# Patient Record
Sex: Male | Born: 1955
Health system: Southern US, Community
[De-identification: ages and names within clinical notes are randomized; demographics above are authoritative.]

## PROBLEM LIST (undated history)

## (undated) DIAGNOSIS — I639 Cerebral infarction, unspecified: Secondary | ICD-10-CM

## (undated) DIAGNOSIS — E785 Hyperlipidemia, unspecified: Secondary | ICD-10-CM

## (undated) DIAGNOSIS — D151 Benign neoplasm of heart: Secondary | ICD-10-CM

## (undated) DIAGNOSIS — I10 Essential (primary) hypertension: Secondary | ICD-10-CM

## (undated) DIAGNOSIS — Z86018 Personal history of other benign neoplasm: Secondary | ICD-10-CM

## (undated) HISTORY — PX: NO PAST SURGERIES: SHX2092

---

## 2016-08-08 DIAGNOSIS — I10 Essential (primary) hypertension: Secondary | ICD-10-CM | POA: Diagnosis not present

## 2016-08-08 DIAGNOSIS — E785 Hyperlipidemia, unspecified: Secondary | ICD-10-CM | POA: Diagnosis not present

## 2016-08-08 DIAGNOSIS — Z23 Encounter for immunization: Secondary | ICD-10-CM | POA: Diagnosis not present

## 2016-08-08 DIAGNOSIS — Z0000 Encounter for general adult medical examination without abnormal findings: Secondary | ICD-10-CM | POA: Diagnosis not present

## 2017-06-04 DIAGNOSIS — Z0000 Encounter for general adult medical examination without abnormal findings: Secondary | ICD-10-CM | POA: Diagnosis not present

## 2017-06-04 DIAGNOSIS — Z23 Encounter for immunization: Secondary | ICD-10-CM | POA: Diagnosis not present

## 2017-06-04 DIAGNOSIS — E669 Obesity, unspecified: Secondary | ICD-10-CM | POA: Diagnosis not present

## 2017-06-04 DIAGNOSIS — I10 Essential (primary) hypertension: Secondary | ICD-10-CM | POA: Diagnosis not present

## 2017-06-04 DIAGNOSIS — Z1211 Encounter for screening for malignant neoplasm of colon: Secondary | ICD-10-CM | POA: Diagnosis not present

## 2018-04-08 DIAGNOSIS — I10 Essential (primary) hypertension: Secondary | ICD-10-CM | POA: Diagnosis not present

## 2018-04-08 DIAGNOSIS — I63512 Cerebral infarction due to unspecified occlusion or stenosis of left middle cerebral artery: Secondary | ICD-10-CM | POA: Diagnosis not present

## 2018-04-08 DIAGNOSIS — I639 Cerebral infarction, unspecified: Secondary | ICD-10-CM | POA: Diagnosis not present

## 2018-04-08 DIAGNOSIS — R29810 Facial weakness: Secondary | ICD-10-CM | POA: Diagnosis not present

## 2018-04-08 DIAGNOSIS — R29818 Other symptoms and signs involving the nervous system: Secondary | ICD-10-CM | POA: Diagnosis not present

## 2018-04-08 DIAGNOSIS — E785 Hyperlipidemia, unspecified: Secondary | ICD-10-CM | POA: Diagnosis not present

## 2018-04-08 DIAGNOSIS — M1990 Unspecified osteoarthritis, unspecified site: Secondary | ICD-10-CM | POA: Diagnosis not present

## 2018-04-08 DIAGNOSIS — I6523 Occlusion and stenosis of bilateral carotid arteries: Secondary | ICD-10-CM | POA: Diagnosis not present

## 2018-04-08 DIAGNOSIS — R4701 Aphasia: Secondary | ICD-10-CM | POA: Diagnosis not present

## 2018-04-08 DIAGNOSIS — I63419 Cerebral infarction due to embolism of unspecified middle cerebral artery: Secondary | ICD-10-CM | POA: Diagnosis not present

## 2018-04-08 DIAGNOSIS — R29702 NIHSS score 2: Secondary | ICD-10-CM | POA: Diagnosis not present

## 2018-04-08 DIAGNOSIS — D151 Benign neoplasm of heart: Secondary | ICD-10-CM | POA: Diagnosis not present

## 2018-04-08 HISTORY — DX: Cerebral infarction, unspecified: I63.9

## 2018-04-08 HISTORY — DX: Benign neoplasm of heart: D15.1

## 2018-04-09 ENCOUNTER — Other Ambulatory Visit: Payer: Self-pay

## 2018-04-09 ENCOUNTER — Encounter (HOSPITAL_COMMUNITY): Payer: Self-pay | Admitting: General Practice

## 2018-04-09 ENCOUNTER — Inpatient Hospital Stay (HOSPITAL_COMMUNITY)
Admission: AD | Admit: 2018-04-09 | Discharge: 2018-04-23 | DRG: 982 | Disposition: A | Discharge status: Home Health | Payer: BLUE CROSS/BLUE SHIELD | Source: Other Acute Inpatient Hospital | Attending: Thoracic Surgery (Cardiothoracic Vascular Surgery) | Admitting: Thoracic Surgery (Cardiothoracic Vascular Surgery)

## 2018-04-09 DIAGNOSIS — I4891 Unspecified atrial fibrillation: Secondary | ICD-10-CM | POA: Diagnosis not present

## 2018-04-09 DIAGNOSIS — K760 Fatty (change of) liver, not elsewhere classified: Secondary | ICD-10-CM | POA: Diagnosis present

## 2018-04-09 DIAGNOSIS — I639 Cerebral infarction, unspecified: Secondary | ICD-10-CM | POA: Diagnosis present

## 2018-04-09 DIAGNOSIS — Z86018 Personal history of other benign neoplasm: Secondary | ICD-10-CM | POA: Diagnosis not present

## 2018-04-09 DIAGNOSIS — Z01810 Encounter for preprocedural cardiovascular examination: Secondary | ICD-10-CM | POA: Diagnosis not present

## 2018-04-09 DIAGNOSIS — E785 Hyperlipidemia, unspecified: Secondary | ICD-10-CM | POA: Diagnosis present

## 2018-04-09 DIAGNOSIS — I484 Atypical atrial flutter: Secondary | ICD-10-CM | POA: Diagnosis not present

## 2018-04-09 DIAGNOSIS — D151 Benign neoplasm of heart: Secondary | ICD-10-CM | POA: Diagnosis not present

## 2018-04-09 DIAGNOSIS — N200 Calculus of kidney: Secondary | ICD-10-CM | POA: Diagnosis not present

## 2018-04-09 DIAGNOSIS — I4892 Unspecified atrial flutter: Secondary | ICD-10-CM | POA: Diagnosis not present

## 2018-04-09 DIAGNOSIS — Z823 Family history of stroke: Secondary | ICD-10-CM

## 2018-04-09 DIAGNOSIS — R29703 NIHSS score 3: Secondary | ICD-10-CM | POA: Diagnosis present

## 2018-04-09 DIAGNOSIS — I2510 Atherosclerotic heart disease of native coronary artery without angina pectoris: Secondary | ICD-10-CM | POA: Diagnosis not present

## 2018-04-09 DIAGNOSIS — Z801 Family history of malignant neoplasm of trachea, bronchus and lung: Secondary | ICD-10-CM

## 2018-04-09 DIAGNOSIS — R29810 Facial weakness: Secondary | ICD-10-CM | POA: Diagnosis present

## 2018-04-09 DIAGNOSIS — Z8249 Family history of ischemic heart disease and other diseases of the circulatory system: Secondary | ICD-10-CM

## 2018-04-09 DIAGNOSIS — I959 Hypotension, unspecified: Secondary | ICD-10-CM | POA: Diagnosis not present

## 2018-04-09 DIAGNOSIS — D219 Benign neoplasm of connective and other soft tissue, unspecified: Secondary | ICD-10-CM | POA: Diagnosis not present

## 2018-04-09 DIAGNOSIS — I69320 Aphasia following cerebral infarction: Secondary | ICD-10-CM | POA: Diagnosis not present

## 2018-04-09 DIAGNOSIS — J9811 Atelectasis: Secondary | ICD-10-CM

## 2018-04-09 DIAGNOSIS — M109 Gout, unspecified: Secondary | ICD-10-CM | POA: Diagnosis not present

## 2018-04-09 DIAGNOSIS — I69359 Hemiplegia and hemiparesis following cerebral infarction affecting unspecified side: Secondary | ICD-10-CM | POA: Diagnosis not present

## 2018-04-09 DIAGNOSIS — F1010 Alcohol abuse, uncomplicated: Secondary | ICD-10-CM | POA: Diagnosis present

## 2018-04-09 DIAGNOSIS — I10 Essential (primary) hypertension: Secondary | ICD-10-CM | POA: Diagnosis present

## 2018-04-09 DIAGNOSIS — K5900 Constipation, unspecified: Secondary | ICD-10-CM | POA: Diagnosis not present

## 2018-04-09 DIAGNOSIS — E119 Type 2 diabetes mellitus without complications: Secondary | ICD-10-CM | POA: Diagnosis present

## 2018-04-09 DIAGNOSIS — I340 Nonrheumatic mitral (valve) insufficiency: Secondary | ICD-10-CM | POA: Diagnosis not present

## 2018-04-09 DIAGNOSIS — I69398 Other sequelae of cerebral infarction: Secondary | ICD-10-CM | POA: Diagnosis not present

## 2018-04-09 DIAGNOSIS — R471 Dysarthria and anarthria: Secondary | ICD-10-CM | POA: Diagnosis present

## 2018-04-09 DIAGNOSIS — I63512 Cerebral infarction due to unspecified occlusion or stenosis of left middle cerebral artery: Secondary | ICD-10-CM | POA: Diagnosis not present

## 2018-04-09 DIAGNOSIS — R269 Unspecified abnormalities of gait and mobility: Secondary | ICD-10-CM | POA: Diagnosis not present

## 2018-04-09 DIAGNOSIS — D62 Acute posthemorrhagic anemia: Secondary | ICD-10-CM | POA: Diagnosis not present

## 2018-04-09 DIAGNOSIS — J90 Pleural effusion, not elsewhere classified: Secondary | ICD-10-CM

## 2018-04-09 DIAGNOSIS — R079 Chest pain, unspecified: Secondary | ICD-10-CM

## 2018-04-09 DIAGNOSIS — E876 Hypokalemia: Secondary | ICD-10-CM | POA: Diagnosis not present

## 2018-04-09 DIAGNOSIS — R000 Tachycardia, unspecified: Secondary | ICD-10-CM | POA: Diagnosis not present

## 2018-04-09 DIAGNOSIS — J939 Pneumothorax, unspecified: Secondary | ICD-10-CM

## 2018-04-09 DIAGNOSIS — R4701 Aphasia: Secondary | ICD-10-CM | POA: Diagnosis present

## 2018-04-09 DIAGNOSIS — E8770 Fluid overload, unspecified: Secondary | ICD-10-CM | POA: Diagnosis not present

## 2018-04-09 DIAGNOSIS — R918 Other nonspecific abnormal finding of lung field: Secondary | ICD-10-CM | POA: Diagnosis not present

## 2018-04-09 DIAGNOSIS — R4781 Slurred speech: Secondary | ICD-10-CM | POA: Diagnosis not present

## 2018-04-09 DIAGNOSIS — I63412 Cerebral infarction due to embolism of left middle cerebral artery: Principal | ICD-10-CM | POA: Diagnosis present

## 2018-04-09 DIAGNOSIS — Z4682 Encounter for fitting and adjustment of non-vascular catheter: Secondary | ICD-10-CM | POA: Diagnosis not present

## 2018-04-09 DIAGNOSIS — I63419 Cerebral infarction due to embolism of unspecified middle cerebral artery: Secondary | ICD-10-CM | POA: Diagnosis not present

## 2018-04-09 HISTORY — DX: Essential (primary) hypertension: I10

## 2018-04-09 HISTORY — DX: Personal history of other benign neoplasm: Z86.018

## 2018-04-09 HISTORY — DX: Benign neoplasm of heart: D15.1

## 2018-04-09 HISTORY — DX: Cerebral infarction, unspecified: I63.9

## 2018-04-09 HISTORY — DX: Hyperlipidemia, unspecified: E78.5

## 2018-04-09 LAB — HEPARIN LEVEL (UNFRACTIONATED): Heparin Unfractionated: 0.26 IU/mL — ABNORMAL LOW (ref 0.30–0.70)

## 2018-04-09 LAB — RAPID URINE DRUG SCREEN, HOSP PERFORMED
Amphetamines: NOT DETECTED
Barbiturates: NOT DETECTED
Benzodiazepines: NOT DETECTED
Cocaine: NOT DETECTED
Opiates: NOT DETECTED
Tetrahydrocannabinol: NOT DETECTED

## 2018-04-09 LAB — TSH: TSH: 1.586 u[IU]/mL (ref 0.350–4.500)

## 2018-04-09 MED ORDER — SENNOSIDES-DOCUSATE SODIUM 8.6-50 MG PO TABS: 1.0000 | ORAL_TABLET | Freq: Every evening | ORAL | Status: DC | PRN

## 2018-04-09 MED ORDER — STROKE: EARLY STAGES OF RECOVERY BOOK
Freq: Once | Status: AC
  Administered 2018-04-09: 18:00:00

## 2018-04-09 MED ORDER — ACETAMINOPHEN 325 MG PO TABS: 650.0000 mg | ORAL_TABLET | ORAL | Status: DC | PRN

## 2018-04-09 MED ORDER — ACETAMINOPHEN 650 MG RE SUPP: 650.0000 mg | RECTAL | Status: DC | PRN

## 2018-04-09 MED ORDER — HEPARIN (PORCINE) 25000 UT/250ML-% IV SOLN
1200.0000 [IU]/h | INTRAVENOUS | Status: DC
  Administered 2018-04-09: 1050 [IU]/h via INTRAVENOUS
  Administered 2018-04-10: 1200 [IU]/h via INTRAVENOUS
  Filled 2018-04-09 (×2): qty 250

## 2018-04-09 MED ORDER — ATORVASTATIN CALCIUM 80 MG PO TABS
80.0000 mg | ORAL_TABLET | Freq: Every day | ORAL | Status: DC
  Administered 2018-04-09: 80 mg via ORAL
  Filled 2018-04-09: qty 1

## 2018-04-09 MED ORDER — ACETAMINOPHEN 160 MG/5ML PO SOLN: 650.0000 mg | ORAL | Status: DC | PRN

## 2018-04-09 MED ORDER — ATORVASTATIN CALCIUM 80 MG PO TABS: 80.0000 mg | ORAL_TABLET | Freq: Every day | ORAL | Status: DC

## 2018-04-09 MED ORDER — SODIUM CHLORIDE 0.9 % IV SOLN
INTRAVENOUS | Status: DC
  Administered 2018-04-09: 18:00:00 via INTRAVENOUS
  Administered 2018-04-10: 1000 mL via INTRAVENOUS
  Administered 2018-04-12 – 2018-04-14 (×3): via INTRAVENOUS

## 2018-04-09 MED ORDER — ATORVASTATIN CALCIUM 40 MG PO TABS: 40.0000 mg | ORAL_TABLET | Freq: Every day | ORAL | Status: DC

## 2018-04-09 NOTE — Consult Note (Addendum)
Neurology Consultation  Reason for Consult: Stroke and clearance for surgery as patient has a myxoma Referring Physician: Lorin Mercy  CC: Expressive aphasia  History is obtained from: wife and chart  HPI: Shawn Mccann is a 63 y.o. male hypertension, hyperlipidemia, atrial myxoma. Patient presented to urgent care after noting at work he was having difficulty speaking and expressing what he wanted to say.He went to urgent care initially then was sent to [facility]. CT head and MRI showed stroke in multiple distributions along with Echo showing a Myxoma in the left atrium. Patient was sent to [facility] [place].  Currently patient continues to have expressive and receptive aphasia.  He has difficulty naming objects, express himself, receiving information.  Neurology was asked to see patient for recommendations about stroke in the setting of atrial myxoma.  ED course: at [facility].--CTA head and neck /CT head/ MRI head. Labs and Echo  LKW: 04/09/2018 tpa given?: no, out of window Premorbid modified Rankin scale (mRS): 0 NIHSS 3   ROS: A 14 point ROS was performed and is negative except as noted in the HPI.  Past Medical History:  Diagnosis Date  . Atrial myxoma 04/08/2018  . CVA (cerebral vascular accident) ([place]) 04/08/2018  . Dyslipidemia   . Hypertension     Family History  Problem Relation Age of Onset  . Lung cancer Mother 50  . CAD Father 35  . Hypertension Sister   . Hypertension Brother   . CVA Maternal Grandfather   . CVA Other     Social History:   reports that he has never smoked. He has never used smokeless tobacco. He reports current alcohol use. He reports that he does not use drugs.  Medications No current facility-administered medications for this encounter.    Exam: Current vital signs: BP (!) 151/83 (BP Location: Left Arm)   Pulse 95   Temp 98.3 F (36.8 C) (Oral)   Resp 18   SpO2 98%  Vital signs in last 24 hours: Temp:  [98.3 F (36.8 C)]  98.3 F (36.8 C) (01/29 1249) Pulse Rate:  [95] 95 (01/29 1249) Resp:  [18] 18 (01/29 1249) BP: (151)/(83) 151/83 (01/29 1249) SpO2:  [98 %] 98 % (01/29 1249)  Physical Exam  Constitutional: Appears well-developed and well-nourished.  Psych: Affect appropriate to situation Eyes: No scleral injection HENT: No OP obstrucion Head: Normocephalic.  Cardiovascular: Normal rate and regular rhythm.  Respiratory: Effort normal, non-labored breathing GI: Soft.  No distension. There is no tenderness.  Skin: WDI  Neuro: Mental Status: Patient is awake, alert, oriented to person, place, month, year, and situation. Patient is able to give a clear and coherent history. Shows expressive, receptive aphasia with difficulty naming and following commands Cranial Nerves: II: Visual Fields are full.  III,IV, VI: EOMI without ptosis or diploplia. Pupils are equal, round, and reactive to light.  He did show saccadic eye movements V: Facial sensation is symmetric to temperature VII: Facial movement is symmetric.  VIII: hearing is intact to voice X: Uvula elevates symmetrically XI: Shoulder shrug is symmetric. XII: tongue is midline without atrophy or fasciculations.  Motor: Tone is normal. Bulk is normal. 5/5 strength was present on left upper and lower extremities.  Right upper extremity is 4/5 and right lower extremity was 5/5 Sensory: Sensation is symmetric to light touch and temperature in the arms and legs. Deep Tendon Reflexes: 2+ and symmetric in the biceps and patellae.  Plantars: Toes are downgoing bilaterally.  Cerebellar: FNF and HKS are  intact bilaterally     Labs I have reviewed labs in epic and the results pertinent to this consultation are:   CBC No results found for: WBC, RBC, HGB, HCT, PLT, MCV, MCH, MCHC, RDW, LYMPHSABS, MONOABS, EOSABS, BASOSABS  CMP  No results found for: NA, K, CL, CO2, GLUCOSE, BUN, CREATININE, CALCIUM, PROT, ALBUMIN, AST, ALT, ALKPHOS, BILITOT,  GFRNONAA, GFRAA  Lipid Panel  No results found for: CHOL, TRIG, HDL, CHOLHDL, VLDL, LDLCALC, LDLDIRECT   Imaging I have reviewed the images obtained:  CT-scan of the brain--Evolving hypodensity involving the supra ganglionic left frontal cortex, compatible with evolving acute left MCA territory infarct.  CTA head and neck--shows no large vessel occlusion.  MRI examination of the brain--BILATERAL areas of restricted diffusion in disparate vascular territories most consistent with multiple acute infarcts secondary to shower of emboli. The dominant abnormality remains LEFT frontal opercular cortex, without visible hemorrhage  Echo: 60-65% EF with 2.2 X 1.3 cm Echo dense mass likely Myxoma  Etta Quill PA-C [facility] [phone]  M-F  (9:00 am- 5:00 PM)  04/09/2018, 2:55 PM   Attending addendum Patient seen and examined Patient is a 63 year old man who presented to [facility] with what finding difficulty and mild right-sided weakness. MRI showed strokes predominantly in the left cerebral hemisphere but also in the right anterior circulation and posterior circulation on the left.  Further work-up of the stroke revealed an atrial myxoma and he was sent to [facility] for cardiothoracic surgery evaluation. On examination, his NIH stroke scale is 3.   Assessment:  63 year old male presenting to hospital after having bilateral infarcts involving multiple vascular distributions in the setting of left atrial myxoma.  Impression: - Cardioembolic stroke - Left atrial myxoma  Recommendations: -stroke protocolNo bolus I V heparins  level 0.3-.05 -Cardiothoracic surgery consultation for myxoma -Majority of the stroke work-up is been completed -Obtain lipid panel and A1c  Stroke team will follow with you.

## 2018-04-09 NOTE — Consult Note (Addendum)
Admit date: 04/09/2018 Referring Physician  Dr. Lorin Mercy Primary Physician  Wende Neighbors, MD Primary Cardiologist  none Reason for Consultation  LA myxoma with CVA  HPI: Shawn Mccann is a 63 y.o. male who is being seen today for the evaluation of LA myxoma at the request of Dr. Karmen Bongo.    This is a 63yo male with a history of HTN who was in his USOH until yesterday when he was at work and started feeling funny and was unable to speak correctly.  He went home and then drove himselt to his sisters house and she noticed he was having difficulty speaking and expressing his words c/w expressive and receptive aphasia. He also had mild right sided weakness.  He was taken to Urgent Care by his family and then to [facility] ER where a head CT showed an acute CVA.  MRI of the brain revealed strokes in multiple distributions but predominantly left cerebral hemisphere but also in the right anterior and posterior circulation on the left.    A 2D echo was done showing a 2.2 x 1.3cm pedunculated mass in LA attached to the IAS by a stalk in the area of the fossa ovalis c/w left atrial myxoma.  The echo also showed  normal LVF with ER 60-65%  With G1DD, mild LAE.  Labs showed WBC 11.3, Hbg 16.2, Na 139, K+ 4, Creatinine 1.2, Trop < 0.01, INR 1.  LDL was 162, HDL 39 Head and neck CTA showed normal aortic arch and subclavian arteries, mild plaque at the right carotid bifurcation with no stenosis and widely patent right ICA, Left carotid system patent, patent vertebral arteries.  He was started on ASA and statin.  No Heparin initiated due to risk of hemorrhagic conversion.  EKG showed NSR at 96 bpm with no ST changes. He is now transferred to [facility] for further evaluation by CVTS surgery.  He has not had any problems up until yesterday.  He denies any chest pain or pressure, SOB, DOE, LE edema, palpitations.  He does not smoke but does have a family hx of CAD and HTN.    PMH:   Past Medical History:  Diagnosis Date  .  Atrial myxoma 04/08/2018  . CVA (cerebral vascular accident) ([place]) 04/08/2018  . Dyslipidemia   . Hypertension      PSH:   Past Surgical History:  Procedure Laterality Date  . NO PAST SURGERIES      Allergies:  Patient has no known allergies. Prior to Admit Meds:   Medications Prior to Admission  Medication Sig Dispense Refill Last Dose  . acetaminophen (TYLENOL) 325 MG tablet Take 650 mg by mouth every 6 (six) hours as needed for mild pain or headache.   Past Month at Unknown time  . metoprolol (TOPROL-XL) 200 MG 24 hr tablet Take 200 mg by mouth daily.   04/08/2018 at 0600  . pravastatin (PRAVACHOL) 80 MG tablet Take 80 mg by mouth daily.   04/08/2018 at Unknown time   Fam HX:    Family History  Problem Relation Age of Onset  . Lung cancer Mother 16  . CAD Father 45  . Hypertension Sister   . Hypertension Brother   . CVA Maternal Grandfather   . CVA Other    Social HX:    Social History   Socioeconomic History  . Marital status: Single    Spouse name: Not on file  . Number of children: Not on file  . Years of education:  Not on file  . Highest education level: Not on file  Occupational History  . Occupation: Games developer  Social Needs  . Financial resource strain: Not on file  . Food insecurity:    Worry: Not on file    Inability: Not on file  . Transportation needs:    Medical: Not on file    Non-medical: Not on file  Tobacco Use  . Smoking status: Never Smoker  . Smokeless tobacco: Never Used  Substance and Sexual Activity  . Alcohol use: Yes    Comment: occasional  . Drug use: Never  . Sexual activity: Not on file  Lifestyle  . Physical activity:    Days per week: Not on file    Minutes per session: Not on file  . Stress: Not on file  Relationships  . Social connections:    Talks on phone: Not on file    Gets together: Not on file    Attends religious service: Not on file    Active member of club or organization: Not on file    Attends meetings  of clubs or organizations: Not on file    Relationship status: Not on file  . Intimate partner violence:    Fear of current or ex partner: Not on file    Emotionally abused: Not on file    Physically abused: Not on file    Forced sexual activity: Not on file  Other Topics Concern  . Not on file  Social History Narrative  . Not on file     ROS:  All  ROS were addressed and are negative except what is stated in the HPI  Physical Exam: Blood pressure (!) 142/88, pulse 91, temperature 97.8 F (36.6 C), temperature source Oral, resp. rate 18, weight 85.7 kg, SpO2 96 %.    General: Well developed, well nourished, in no acute distress Head: Eyes PERRLA, No xanthomas.   Normal cephalic and atramatic  Lungs:   Clear bilaterally to auscultation and percussion. Heart:   HRRR S1 S2 Pulses are 2+ & equal.            No carotid bruit. No JVD.  No abdominal bruits. No femoral bruits. Abdomen: Bowel sounds are positive, abdomen soft and non-tender without masses or                  Hernia's noted. Msk:  Back normal, normal gait. Normal strength and tone for age. Extremities:   No clubbing, cyanosis or edema.  DP +1 Neuro: Alert and oriented X 3. Psych:  Good affect, responds appropriately    Labs:  No results found for: WBC, HGB, HCT, MCV, PLT No results for input(s): NA, K, CL, CO2, BUN, CREATININE, CALCIUM, PROT, BILITOT, ALKPHOS, ALT, AST, GLUCOSE in the last 168 hours.  Invalid input(s): LABALBU No results found for: PTT No results found for: INR, PROTIME No results found for: CKTOTAL, CKMB, CKMBINDEX, TROPONINI  No results found for: CHOL No results found for: HDL No results found for: LDLCALC No results found for: TRIG No results found for: CHOLHDL No results found for: LDLDIRECT    Radiology:  No results found.   Telemetry    NSR - Personally Reviewed  ECG     NSR with no ST changes- Personally Reviewed   ASSESSMENT/PLAN:   1.  Left atrial myxoma -2D echo showed  2.2 x 1.3cm pedunculated LA mass attached by a stalk to the IAS at the fossa ovalis c/w myxoma -Likely etiology of  acute CVAs in multiple vascular territories -Will make NPO after MN for TEE in am -Will need ischemic workup prior to surgical resection.  Given recent CVAs will discuss with CVTS if they are ok with coronary CTA to determine if underlying CAD is present prior to surgery instead of cath. -continue ASA - After careful review of history and examination, the risks and benefits of transesophageal echocardiogram have been explained including risks of esophageal damage, perforation (1:10,000 risk), bleeding, pharyngeal hematoma as well as other potential complications associated with conscious sedation including aspiration, arrhythmia, respiratory failure and death. Alternatives to treatment were discussed, questions were answered. Patient is willing to proceed.   2.  Acute CVAs in multiple vascular territories c/w embolic phenomenon -likely secondary to atrial myxoma -Discharge summary stated "no Heparin due to risk of hemorrhagic conversion" per neuro but now on IV Heparin gtt - defer to IM and Neuro -Head and neck CTA with no obstructive findings -Neuro to follow   3.  HTN -BP elevated initially and Neuro wants permissive HTN for first 24 hours - BP currentyly 142/43mmHg. -was on Lotrel prior to admission -re initiation of BP meds per Neuro and IM - likely tomorrow  4.  Hyperlipidemia -LDL was 162 on admission -start on statin  I have spent a total of 45 minutes with patient reviewing outside hospital notes, 2D echo, Head and neck CTA, Head MRI , telemetry, EKGs, labs and examining patient as well as establishing an assessment and plan that was discussed with the patient.  > 50% of time was spent in direct patient care.    Fransico Him, MD  04/09/2018  5:28 PM

## 2018-04-09 NOTE — H&P (Signed)
History and Physical    Shawn Mccann MWN:[id number] DOB: 1955/05/22 DOA: 04/09/2018  PCP: Ocie Doyne., MD Consultants:  None Patient coming from:  Home - lives alone; NOK: [place], [phone]  Chief Complaint: RH transfer for CVA from possible myxoma  HPI: Shawn Mccann is a 63 y.o. male with medical history significant of HTN presenting with dysarthria; he was found to have multiple infracts on evaluation at [facility] and was transferred to [facility].  Yesterday, he came home from work after noticing feeling funny; it started about 330.  He got home and ws there for a couple of hours.  He drove to his sister's house and he was unable to speak at all.  His BIL took him to an Urgent Care and they sent him to the [facility] ER.At the ER, they did a CT which showed CVA.  They did MRI and Echo and saw what was concerning for a heart issue.  He had no extremity symptoms.  His only obvious issue is expressive aphasia.  It seems to be a bit better.  Family believes he looks normal.   [facility] Course:  Per Dr. Lupita Leash: 63 year old male with history of hypertension presented with dysarthria/ expressive aphasia on 1/28 at [facility]. workup showed multiple acute infarcts, echo with 2.2 x 1.3 mm left atrial mass suspecting atrial myxoma. CT surgeon Dr Roxy Manns contacted, will likely need surgery. Please consult CT surgery on arrival to [facility]. will also need to consult Cardiology and Neurology per CT surgery.    Review of Systems: As per HPI; otherwise review of systems reviewed and negative.   Ambulatory Status:  Ambulates without assistance  Past Medical History:  Diagnosis Date  . Atrial myxoma 04/08/2018  . CVA (cerebral vascular accident) ([place]) 04/08/2018  . Dyslipidemia   . Hypertension     Past Surgical History:  Procedure Laterality Date  . NO PAST SURGERIES      Social History   Socioeconomic History  . Marital status: Single    Spouse name: Not on file  . Number of children: Not on file  . Years of  education: Not on file  . Highest education level: Not on file  Occupational History  . Occupation: Games developer  Social Needs  . Financial resource strain: Not on file  . Food insecurity:    Worry: Not on file    Inability: Not on file  . Transportation needs:    Medical: Not on file    Non-medical: Not on file  Tobacco Use  . Smoking status: Never Smoker  . Smokeless tobacco: Never Used  Substance and Sexual Activity  . Alcohol use: Yes    Comment: occasional  . Drug use: Never  . Sexual activity: Not on file  Lifestyle  . Physical activity:    Days per week: Not on file    Minutes per session: Not on file  . Stress: Not on file  Relationships  . Social connections:    Talks on phone: Not on file    Gets together: Not on file    Attends religious service: Not on file    Active member of club or organization: Not on file    Attends meetings of clubs or organizations: Not on file    Relationship status: Not on file  . Intimate partner violence:    Fear of current or ex partner: Not on file    Emotionally abused: Not on file    Physically abused: Not on file  Forced sexual activity: Not on file  Other Topics Concern  . Not on file  Social History Narrative  . Not on file    No Known Allergies  Family History  Problem Relation Age of Onset  . Lung cancer Mother 52  . CAD Father 21  . Hypertension Sister   . Hypertension Brother   . CVA Maternal Grandfather   . CVA Other     Prior to Admission medications   Medication Sig Start Date End Date Taking? Authorizing Provider  acetaminophen (TYLENOL) 325 MG tablet Take 650 mg by mouth every 6 (six) hours as needed for mild pain or headache.   Yes [provider]  metoprolol (TOPROL-XL) 200 MG 24 hr tablet Take 200 mg by mouth daily. 02/28/18  Yes [provider]  pravastatin (PRAVACHOL) 80 MG tablet Take 80 mg by mouth daily. 02/28/18  Yes [provider]    Physical Exam: Vitals:    04/09/18 1249 04/09/18 1449 04/09/18 1547 04/09/18 1618  BP: (!) 151/83 (!) 144/87  (!) 142/88  Pulse: 95 90  91  Resp: 18   18  Temp: 98.3 F (36.8 C)   97.8 F (36.6 C)  TempSrc: Oral   Oral  SpO2: 98% 99%  96%  Weight:   85.7 kg      . General:  Appears calm and comfortable and is NAD; he has a marked expressive aphasia . Eyes:  PERRL, EOMI, left ptosis (chronic?) . ENT:  grossly normal hearing, lips & tongue, mmm . Neck:  no LAD, masses or thyromegaly; no carotid bruits . Cardiovascular:  RRR, no m/r/g. No LE edema.  Marland Kitchen Respiratory:   CTA bilaterally with no wheezes/rales/rhonchi.  Normal respiratory effort. . Abdomen:  soft, NT, ND, NABS . Skin:  no rash or induration seen on limited exam . Musculoskeletal:  grossly normal tone BUE/BLE, good ROM, no bony abnormality . Psychiatric:  grossly normal mood and affect, pronounced expressive aphasia but he is able to communicate some, AOx3 . Neurologic: CN 2-12 grossly intact other than possible left facial droop (family denies and says this is chronic and hereditary), moves all extremities in coordinated fashion, sensation intact    Radiological Exams on Admission:  CT head "Evolving hypodensity involving the supra ganglionic left frontal cortex, compatible with evolving acute left MCA territory infarct.No intracranial hemorrhage"   MRI Brain "multiple acute infarcts secondary to shower of emboli"   TTE  "1. The left ventricular size is normal. Left ventricular wall thickness is normal. Overall left ventricular systolic function is normal with, an EF between 60 - 65 %. 2. The diastolic filling pattern indicates impaired relaxation. 3. Left atrium is mildly dilated by volume. There is an echodensity in the left atrium attached to the fossa ovalis/atrial septum measuring 2.2X1.3 mm. The mass characteristics are suggestive of a myxoma   CTA neck  "Negative CTA for large vessel occlusion. No hemodynamically significant or  correctable stenosis identified"   Assessment/Plan Principal Problem:   CVA (cerebral vascular accident) ([place]) Active Problems:   Atrial myxoma   Hypertension   Dyslipidemia   CVA -Patient with acute left MCA CVA on CT -MRI with multiple acute infarcts secondary to shower of emboli -Source of emboli is almost certainly atrial myxoma, if present (see below) -Will admit for further CVA evaluation -SDU placement due to concern for myxoma -Telemetry monitoring -Needs TEE and cardiac cath pre-operatively -Risk stratification with FLP, A1c; will also check TSH and UDS -No ASA due to  impending CT surgery - will bridge with Heparin for now as per Neuro -Neurology consult -PT/OT/ST/Nutrition Consults -SW consult for placement pending on progress  Atrial myxoma -CVTS to consult; assuming myxoma is present he will need resection in the coming days -Cardiology has seen the patient -Plan for TEE in AM, NPO after midnight -He will also need ischemic evaluation pre-operatively either via coronary CTA or cath -As per neuro, he cannot have ASA and is appropriate for [facility] with heparin  HTN -Allow permissive HTN for now -Treat BP only if >220/120, and then with goal of 15% reduction -Hold Toprol XL and plan to restart in 48-72 hours   HLD -Check FLP -Resume statin but will change Pravachol to Lipitor 80 mg daily -Despite this not being a CVA apparently associated with CVD RF, it is still important to control his RF empirically to prevent future CVAs      DVT prophylaxis:  Heparin  Code Status: Full - confirmed with patient/family Family Communication: Sisters present throughout evaluation Disposition Plan:  To be determined Consults called: CT surgery; Cardiology; Neurology; PT/OT/ST/Nutrition  Admission status: Admit - It is my clinical opinion that admission to INPATIENT is reasonable and necessary because of the expectation that this patient will require hospital care that crosses at  least 2 midnights to treat this condition based on the medical complexity of the problems presented.  Given the aforementioned information, the predictability of an adverse outcome is felt to be significant.    Karmen Bongo MD [facility]   How to contact the [facility] Attending or Consulting provider [place] or covering provider during after hours [place], for this patient?  1. Check the care team in [facility] and look for a) attending/consulting [facility] provider listed and b) the [facility] team listed 2. Log into [email or web address] and use [place]'s universal password to access. If you do not have the password, please contact the hospital operator. 3. Locate the [facility] provider you are looking for under [facility] and page to a number that you can be directly reached. 4. If you still have difficulty reaching the provider, please page the [facility] (Director on Call) for the Hospitalists listed on amion for assistance.   04/09/2018, 5:54 PM

## 2018-04-09 NOTE — Progress Notes (Signed)
ANTICOAGULATION CONSULT NOTE - Initial Consult  Pharmacy Consult for heparin Indication: CVA with atrial myxoma  No Known Allergies  Patient Measurements: Weight: 188 lb 15 oz (85.7 kg) Heparin Dosing Weight: 85.7 kg  Vital Signs: Temp: 98.3 F (36.8 C) (01/29 1249) Temp Source: Oral (01/29 1249) BP: 144/87 (01/29 1449) Pulse Rate: 90 (01/29 1449)  Labs: No results for input(s): HGB, HCT, PLT, APTT, LABPROT, INR, HEPARINUNFRC, HEPRLOWMOCWT, CREATININE, CKTOTAL, CKMB, TROPONINI in the last 72 hours.  CrCl cannot be calculated (No successful lab value found.).   Medical History: Past Medical History:  Diagnosis Date  . Atrial myxoma 04/08/2018  . CVA (cerebral vascular accident) ([place]) 04/08/2018  . Dyslipidemia   . Hypertension    Assessment: 63 yo male with expressive aphasia. Patient found to have CVA with atrial myxoma. Pharmacy consulted to dose heparin. No anticoagulation PTA noted. No bleeding currently.   Goal of Therapy:  Heparin level 0.3-0.5 Monitor platelets by anticoagulation protocol: Yes   Plan:  Heparin drip at 1050 units/hr, no bolus Heparin level in 6 hours Heparin level and CBC daily Monitor for bleeding.   Breella Vanostrand A. Levada Dy, PharmD, [place] Pager: [phone] Please utilize Amion for appropriate phone number to reach the unit pharmacist ([place])   04/09/2018,3:51 PM

## 2018-04-09 NOTE — Progress Notes (Signed)
ANTICOAGULATION CONSULT NOTE   Pharmacy Consult for Heparin Indication: CVA with atrial myxoma  No Known Allergies  Patient Measurements: Weight: 188 lb 15 oz (85.7 kg) Heparin Dosing Weight: 85.7 kg  Vital Signs: Temp: 98.1 F (36.7 C) (01/29 2113) Temp Source: Oral (01/29 2113) BP: 144/102 (01/29 2113) Pulse Rate: 72 (01/29 2113)  Labs: Recent Labs    04/09/18 2215  HEPARINUNFRC 0.26*    CrCl cannot be calculated (No successful lab value found.).   Medical History: Past Medical History:  Diagnosis Date  . Atrial myxoma 04/08/2018  . CVA (cerebral vascular accident) ([place]) 04/08/2018  . Dyslipidemia   . Hypertension    Assessment: 63 yo male with expressive aphasia. Patient found to have CVA with atrial myxoma. Pharmacy consulted to dose heparin. No anticoagulation PTA noted. No bleeding currently.   1/29 PM update: heparin level just below goal, no issues per RN.  Goal of Therapy:  Heparin level 0.3-0.5 units/mL Monitor platelets by anticoagulation protocol: Yes   Plan:  No boluses  Inc heparin drip to 1200 units/hr Re-check heparin level with AM labs  Narda Bonds, PharmD, [place] Pharmacist Phone: [phone]

## 2018-04-10 ENCOUNTER — Encounter (HOSPITAL_COMMUNITY): Payer: Self-pay | Admitting: *Deleted

## 2018-04-10 ENCOUNTER — Inpatient Hospital Stay (HOSPITAL_COMMUNITY): Payer: BLUE CROSS/BLUE SHIELD

## 2018-04-10 ENCOUNTER — Encounter (HOSPITAL_COMMUNITY)
Admission: AD | Disposition: A | Discharge status: Home Health | Payer: Self-pay | Source: Other Acute Inpatient Hospital | Attending: Thoracic Surgery (Cardiothoracic Vascular Surgery)

## 2018-04-10 DIAGNOSIS — I639 Cerebral infarction, unspecified: Secondary | ICD-10-CM

## 2018-04-10 DIAGNOSIS — I10 Essential (primary) hypertension: Secondary | ICD-10-CM

## 2018-04-10 DIAGNOSIS — D151 Benign neoplasm of heart: Secondary | ICD-10-CM

## 2018-04-10 HISTORY — PX: TEE WITHOUT CARDIOVERSION: SHX5443

## 2018-04-10 LAB — CBC
HCT: 42.4 % (ref 39.0–52.0)
HEMOGLOBIN: 13.8 g/dL (ref 13.0–17.0)
MCH: 28.7 pg (ref 26.0–34.0)
MCHC: 32.5 g/dL (ref 30.0–36.0)
MCV: 88.1 fL (ref 80.0–100.0)
Platelets: 233 10*3/uL (ref 150–400)
RBC: 4.81 MIL/uL (ref 4.22–5.81)
RDW: 12.4 % (ref 11.5–15.5)
WBC: 7.1 10*3/uL (ref 4.0–10.5)
nRBC: 0 % (ref 0.0–0.2)

## 2018-04-10 LAB — LIPID PANEL
CHOL/HDL RATIO: 4.7 ratio
Cholesterol: 168 mg/dL (ref 0–200)
HDL: 36 mg/dL — ABNORMAL LOW (ref >40)
LDL Cholesterol: 118 mg/dL — ABNORMAL HIGH (ref 0–99)
Triglycerides: 72 mg/dL (ref <150)
VLDL: 14 mg/dL (ref 0–40)

## 2018-04-10 LAB — BASIC METABOLIC PANEL WITH GFR
Anion gap: 11 (ref 5–15)
BUN: 13 mg/dL (ref 8–23)
CO2: 18 mmol/L — ABNORMAL LOW (ref 22–32)
Calcium: 8.5 mg/dL — ABNORMAL LOW (ref 8.9–10.3)
Chloride: 109 mmol/L (ref 98–111)
Creatinine, Ser: 1.02 mg/dL (ref 0.61–1.24)
GFR calc Af Amer: >60 mL/min
GFR calc non Af Amer: >60 mL/min
Glucose, Bld: 93 mg/dL (ref 70–99)
Potassium: 3.9 mmol/L (ref 3.5–5.1)
Sodium: 138 mmol/L (ref 135–145)

## 2018-04-10 LAB — HEMOGLOBIN A1C
Hgb A1c MFr Bld: 5.6 % (ref 4.8–5.6)
MEAN PLASMA GLUCOSE: 114.02 mg/dL

## 2018-04-10 LAB — HEPARIN LEVEL (UNFRACTIONATED): Heparin Unfractionated: 0.41 IU/mL (ref 0.30–0.70)

## 2018-04-10 LAB — HIV ANTIBODY (ROUTINE TESTING W REFLEX): HIV Screen 4th Generation wRfx: NONREACTIVE

## 2018-04-10 SURGERY — ECHOCARDIOGRAM, TRANSESOPHAGEAL
Anesthesia: Moderate Sedation

## 2018-04-10 MED ORDER — MIDAZOLAM HCL (PF) 10 MG/2ML IJ SOLN
INTRAMUSCULAR | Status: DC | PRN
  Administered 2018-04-10 (×2): 2 mg via INTRAVENOUS

## 2018-04-10 MED ORDER — SODIUM CHLORIDE 0.9 % WEIGHT BASED INFUSION: 3.0000 mL/kg/h | INTRAVENOUS | Status: DC

## 2018-04-10 MED ORDER — SODIUM CHLORIDE 0.9 % WEIGHT BASED INFUSION
1.0000 mL/kg/h | INTRAVENOUS | Status: DC
  Administered 2018-04-11: 1 mL/kg/h via INTRAVENOUS

## 2018-04-10 MED ORDER — BUTAMBEN-TETRACAINE-BENZOCAINE 2-2-14 % EX AERO
INHALATION_SPRAY | CUTANEOUS | Status: DC | PRN
  Administered 2018-04-10: 2 via TOPICAL

## 2018-04-10 MED ORDER — FENTANYL CITRATE (PF) 100 MCG/2ML IJ SOLN
INTRAMUSCULAR | Status: AC
  Filled 2018-04-10: qty 2

## 2018-04-10 MED ORDER — ATORVASTATIN CALCIUM 40 MG PO TABS
40.0000 mg | ORAL_TABLET | Freq: Every day | ORAL | Status: DC
  Administered 2018-04-10 – 2018-04-14 (×5): 40 mg via ORAL
  Filled 2018-04-10 (×5): qty 1

## 2018-04-10 MED ORDER — SODIUM CHLORIDE 0.9 % IV SOLN: 250.0000 mL | INTRAVENOUS | Status: DC | PRN

## 2018-04-10 MED ORDER — SODIUM CHLORIDE 0.9% FLUSH: 3.0000 mL | INTRAVENOUS | Status: DC | PRN

## 2018-04-10 MED ORDER — SODIUM CHLORIDE 0.9 % IV SOLN: INTRAVENOUS | Status: DC

## 2018-04-10 MED ORDER — ENOXAPARIN SODIUM 40 MG/0.4ML ~~LOC~~ SOLN
40.0000 mg | SUBCUTANEOUS | Status: DC
  Filled 2018-04-10: qty 0.4

## 2018-04-10 MED ORDER — SODIUM CHLORIDE 0.9% FLUSH
3.0000 mL | Freq: Two times a day (BID) | INTRAVENOUS | Status: DC
  Administered 2018-04-10: 3 mL via INTRAVENOUS

## 2018-04-10 MED ORDER — FENTANYL CITRATE (PF) 100 MCG/2ML IJ SOLN
INTRAMUSCULAR | Status: DC | PRN
  Administered 2018-04-10 (×2): 25 ug via INTRAVENOUS

## 2018-04-10 MED ORDER — MIDAZOLAM HCL (PF) 5 MG/ML IJ SOLN
INTRAMUSCULAR | Status: AC
  Filled 2018-04-10: qty 2

## 2018-04-10 MED ORDER — HEPARIN (PORCINE) 25000 UT/250ML-% IV SOLN
1200.0000 [IU]/h | INTRAVENOUS | Status: DC
  Administered 2018-04-10: 1200 [IU]/h via INTRAVENOUS
  Filled 2018-04-10: qty 250

## 2018-04-10 MED ORDER — ASPIRIN 81 MG PO CHEW: 81.0000 mg | CHEWABLE_TABLET | ORAL | Status: DC

## 2018-04-10 NOTE — Progress Notes (Signed)
Cardiac catheterization discussed.   The patient understands that risks included but are not limited to stroke (1 in 1000), death (1 in 55), kidney failure [usually temporary] (1 in 500), bleeding (1 in 200), allergic reaction [possibly serious] (1 in 200).   Scheduled for heart cath tomorrow 7:30am with Dr. Tamala Julian. NPO at midnight.   Ledora Bottcher, PA-C 04/10/2018, 11:35 AM [place] [address] [place], [place] [address]

## 2018-04-10 NOTE — Plan of Care (Addendum)
Given small size of infarcts without hemorrhagic conversion on MRI and high risk of embolism from myxoma, recommend to continue heparin IV for stroke prevention until surgery resection of myxoma. Heparin IV per stroke protocol - no bolus, heparin level 0.3-0.5.  Rosalin Hawking, MD PhD Stroke Neurology 04/10/2018 10:25 PM

## 2018-04-10 NOTE — Op Note (Signed)
INDICATIONS: left atrial mass  PROCEDURE:   Informed consent was obtained prior to the procedure. The risks, benefits and alternatives for the procedure were discussed and the patient comprehended these risks.  Risks include, but are not limited to, cough, sore throat, vomiting, nausea, somnolence, esophageal and stomach trauma or perforation, bleeding, low blood pressure, aspiration, pneumonia, infection, trauma to the teeth and death.    After a procedural time-out, the oropharynx was anesthetized with 20% benzocaine spray.   During this procedure the patient was administered a total of Versed 4 mg and Fentanyl 50 mcg to achieve and maintain moderate conscious sedation.  The patient's heart rate, blood pressure, and oxygen saturationweare monitored continuously during the procedure. The period of conscious sedation was 13 minutes, of which I was present face-to-face 100% of this time.  The transesophageal probe was inserted in the esophagus and stomach without difficulty and multiple views were obtained.  The patient was kept under observation until the patient left the procedure room.  The patient left the procedure room in stable condition.   Agitated microbubble saline contrast was not administered.  COMPLICATIONS:    There were no immediate complications.  FINDINGS:  Left atrial mass typical for myxoma, pedunculated, mobile, heterogenous, attached to the fossa ovalis, measuring 1.9 cm x 1.3 cm.  Otherwise, normal TEE.  RECOMMENDATIONS:    Surgical excision of atrial myxoma.  Time Spent Directly with the Patient:  30 minutes   Sean Macwilliams 04/10/2018, 2:40 PM

## 2018-04-10 NOTE — Consult Note (Addendum)
[address] HeightsSuite 411       [place] [address]             [place] Record #[id number] Date of Birth: 20-Mar-1955  Referring: Dr. Lorin Mercy, MD Primary Care: Ocie Doyne., MD Primary Cardiologist: New to Dr. Radford Pax  Chief Complaint:   CVA, difficulty speaking Reason for consultation: Left atrial myxoma   History of Present Illness:     This is a 63 year old male with a past medical history of hypertension, dyslipidemia who initially presented to [facility] with complaints of difficulty speaking and expressing what he wanted to say. 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 He went home and after several hours drove himself to his sister's house. She noticed he was having difficulty speaking and expressing his words  (expressive and receptive aphasia). It is questionable as to if he had mild right sided weakness. He was taken to Urgent Care who transferred him to [facility] for further evaluation. CT and MRI of the head were done. CT showed evolving hypodensity involving the supra ganglionic left frontal cortex, compatible with evolving acute left MCA territory infarct. MRI showed bilateral areas of restricted diffusion in disparate vascular territories most consistent with multiple acute infarcts secondary to shower of emboli. The dominant abnormality remains LEFT frontal opercular cortex, without visible hemorrhage. CTA of head and neck showed no large vessel occlusion. Echo then showed LVEF 60-65%, 2.2 x 1.3cm pedunculated mass in LA attached to the IAS by a stalk in the area of the fossa ovalis c/w left atrial myxoma. He was started on a statin and aspirin. Dr. Roxy Manns has been consulted for further evaluation and treatment of the left atrial myxoma. At the time of exam, patient is tachycardic. He has expressive and receptive aphasia. Both of his sisters are at his bedside.  Current Activity/  Functional Status: Patient is independent with mobility/ambulation, transfers, ADL's, IADL's.   Zubrod Score: At the time of surgery this patient's most appropriate activity status/level should be described as: []     0    Normal activity, no symptoms [x]     1    Restricted in physical strenuous activity but ambulatory, able to do out light work []     2    Ambulatory and capable of self care, unable to do work activities, up and about  more than 50%  Of the time                          []     3    Only limited self care, in bed greater than 50% of waking hours []     4    Completely disabled, no self care, confined to bed or chair []     5    Moribund  Past Medical History:  Diagnosis Date  . Atrial myxoma 04/08/2018  . CVA (cerebral vascular accident) ([place]) 04/08/2018  . Dyslipidemia   . Hypertension     Past Surgical History:  Procedure Laterality Date  . NO PAST SURGERIES      Social History   Tobacco Use  Smoking Status Never Smoker  Smokeless Tobacco Never Used    Social History   Substance and Sexual Activity  Alcohol Use Yes   Comment: occasional    Allergies: No Known Allergies  Current Facility-Administered Medications  Medication  Dose Route Frequency Provider Last Rate Last Dose  . 0.9 %  sodium chloride infusion   Intravenous Continuous Karmen Bongo, MD 50 mL/hr at 04/10/18 0400    . acetaminophen (TYLENOL) tablet 650 mg  650 mg Oral Q4H PRN Karmen Bongo, MD       Or  . acetaminophen (TYLENOL) solution 650 mg  650 mg Per Tube Q4H PRN Karmen Bongo, MD       Or  . acetaminophen (TYLENOL) suppository 650 mg  650 mg Rectal Q4H PRN Karmen Bongo, MD      . atorvastatin (LIPITOR) tablet 80 mg  80 mg Oral q1800 Karmen Bongo, MD   80 mg at 04/09/18 1824  . heparin ADULT infusion 100 units/mL (25000 units/220mL sodium chloride 0.45%)  1,200 Units/hr Intravenous Continuous Erenest Blank, RPH 12 mL/hr at 04/10/18 0400 1,200 Units/hr at 04/10/18 0400  .  senna-docusate (Senokot-S) tablet 1 tablet  1 tablet Oral QHS PRN Karmen Bongo, MD        Medications Prior to Admission  Medication Sig Dispense Refill Last Dose  . acetaminophen (TYLENOL) 325 MG tablet Take 650 mg by mouth every 6 (six) hours as needed for mild pain or headache.   Past Month at Unknown time  . metoprolol (TOPROL-XL) 200 MG 24 hr tablet Take 200 mg by mouth daily.   04/08/2018 at 0600  . pravastatin (PRAVACHOL) 80 MG tablet Take 80 mg by mouth daily.   04/08/2018 at Unknown time    Family History  Problem Relation Age of Onset  . Lung cancer Mother 7  . CAD Father 77  . Hypertension Sister   . Hypertension Brother   . CVA Maternal Grandfather   . CVA Other    Review of Systems:     Cardiac Review of Systems: Y or  [    ]= no  Chest Pain [ N   ]  Resting SOB [ N  ] Exertional SOB  [ N ]  Orthopnea [ N ]   Pedal Edema [ N  ]    Palpitations [  N] Syncope  [ N ]   Presyncope [  N ]  General Review of Systems: [Y] = yes [  ]=no Constitional:  fatigue [ N ]; nausea Aqua.Slicker  ]; fever [ N ]; or chills [ N ]                                                                Eye : blurred vision [ N ]; diplopia Aqua.Slicker   ] Resp: cough Aqua.Slicker  ];  wheezing[ N ];  hemoptysis[N  ];  GI:   vomiting[ N ];  dysphagia[N  ]; melena[N  ];  hematochezia [ N ]; GU: hematuria[  N];                Skin: rash, swelling[ N ];, hair loss[  ];  Musculosketetal: myalgias[N  ];  joint swelling[N ];  joint erythema[N ];   Heme/Lymph:   bleeding[N  ];  anemia[N  ];  Neuro: stroke[  Y];  vertigo[N  ];  seizures[ N ];     difficulty walking[ N ];  Psych:depression[N  ];   Endocrine: diabetes[ N ];  thyroid dysfunction[ N ];  Physical Exam: BP (!) 150/93 (BP Location: Left Arm)   Pulse 72   Temp 98.7 F (37.1 C) (Oral)   Resp 17   Wt 85.7 kg   SpO2 98%    General appearance: alert, cooperative and no distress Head: Normocephalic, without obvious abnormality, atraumatic Neck: no carotid  bruit, no JVD and supple, symmetrical, trachea midline Resp: clear to auscultation bilaterally Cardio: Tachycardic, no murmur GI: Soft, protuberant, non tender, bowel sounds present Extremities: No LE edema;palpable distal pulses Neurologic: Grossly normal  Diagnostic Studies & Laboratory data:   Recent Radiology Findings:   No results found.   I have independently reviewed the above radiologic studies and discussed with the patient   Recent Lab Findings: Lab Results  Component Value Date   WBC 7.1 04/10/2018   HGB 13.8 04/10/2018   HCT 42.4 04/10/2018   PLT 233 04/10/2018   CHOL 168 04/10/2018   TRIG 72 04/10/2018   HDL 36 (L) 04/10/2018   LDLCALC 118 (H) 04/10/2018   TSH 1.586 04/09/2018   HGBA1C 5.6 04/10/2018   Assessment / Plan:   1. Stroke secondary to left atrial myxoma. TEE ordered for today and a cardiac catheterization is ordered for the am. Dr. Roxy Manns will evaluate and make further recommendations.. Of note, he is on Heparin drip per medicine;will defer to neurology et al  2. History of hypertension-per his sister, he has been on medication for years, most recently Toprol XL 3. History of dyslipidemia-on Pravastatin prior to admission. He has now been put on Atorvastatin.    Lars Pinks PA-C 04/10/2018 7:40 AM   I have seen and examined the patient and agree with the assessment as outlined above by Lars Pinks, PA-C.  Patient is a 63 year old male with history of hypertension and hyperlipidemia who has otherwise been healthy until he presented with acute onset expressive aphasia yesterday.  He was initially evaluated at [facility] in [place] where CT of the head revealed signs of an acute stroke.  CTA of the head and neck and MRI of the head confirmed the presence of multiple infarcts consistent with shower of embolization to both sides of the brain with a dominant lesion in the left frontal opercular cortex without any associated signs of  hemorrhage.  An echocardiogram was performed suggesting the presence of left atrial myxoma.  The patient was transferred to [facility] and has been evaluated previously by both the cardiology and neurology teams.    I have personally reviewed the patient's transesophageal echocardiogram as well as the MRI of the head performed at [facility].  The patient has an obvious benign appearing mass within the left atrium adherent to the fossa ovalis with gross anatomical characteristics pathognomonic for atrial myxoma.  By TEE the mass measured 1.9 x 1.3 cm.  There were no other abnormalities noted.  Patient needs elective surgical resection.  Because the size of the patient's stroke is relatively small and there are no signs of significant intracranial hemorrhage, I favor proceeding with surgery in the near future presuming that the patient's neurologic status remained stable over the next few days.  The patient will need to undergo diagnostic cardiac catheterization to rule out the presence of significant coronary artery disease.  In the absence of significant coronary artery disease the patient may be a good candidate for minimally invasive approach for surgery.  I have personally discussed the nature of the patient's clinical problem and the results of his TEE and MRI of the brain  at length with the patient and his 2 sisters at the bedside this evening.  The indications, risk, and potential benefits of surgery have been discussed.  Expectations for the patient's postoperative convalescence have been discussed.  All of their questions have been answered.  We will tentatively plan to proceed with surgery on Tuesday, April 15, 2018 as long as the patient's neurologic status remains stable.   I spent in excess of 60 minutes during the conduct of this hospital encounter and >50% of this time involved direct face-to-face encounter with the patient for counseling and/or coordination of their  care.   Rexene Alberts, MD 04/10/2018 6:14 PM

## 2018-04-10 NOTE — Interval H&P Note (Signed)
Cath Lab Visit (complete for each Cath Lab visit)  Clinical Evaluation Leading to the Procedure:   ACS: No.  Non-ACS:    Anginal Classification: CCS I  Anti-ischemic medical therapy: No Therapy  Non-Invasive Test Results: No non-invasive testing performed  Prior CABG: No previous CABG      History and Physical Interval Note:  04/10/2018 5:51 PM  Shawn Mccann  has presented today for surgery, with the diagnosis of pre op  The various methods of treatment have been discussed with the patient and family. After consideration of risks, benefits and other options for treatment, the patient has consented to  Procedure(s): LEFT HEART CATH AND CORONARY ANGIOGRAPHY (N/A) as a surgical intervention .  The patient's history has been reviewed, patient examined, no change in status, stable for surgery.  I have reviewed the patient's chart and labs.  Questions were answered to the patient's satisfaction.     Belva Crome III

## 2018-04-10 NOTE — Interval H&P Note (Signed)
History and Physical Interval Note:  04/10/2018 1:24 PM  Shawn Mccann  has presented today for surgery, with the diagnosis of left atrial mixoma  The various methods of treatment have been discussed with the patient and family. After consideration of risks, benefits and other options for treatment, the patient has consented to  Procedure(s): TRANSESOPHAGEAL ECHOCARDIOGRAM (TEE) (N/A) as a surgical intervention .  The patient's history has been reviewed, patient examined, no change in status, stable for surgery.  I have reviewed the patient's chart and labs.  Questions were answered to the patient's satisfaction.     [name]

## 2018-04-10 NOTE — Progress Notes (Signed)
Progress Note  Patient Name: Shawn Mccann Date of Encounter: 04/10/2018  Primary Cardiologist: No primary care provider on file.   Subjective   Denies any chest pain or shortness of breath.  Feels like his speech is gotten better.  Inpatient Medications    Scheduled Meds: . atorvastatin  40 mg Oral q1800   Continuous Infusions: . sodium chloride 50 mL/hr at 04/10/18 0400  . heparin 1,200 Units/hr (04/10/18 0400)   PRN Meds: acetaminophen **OR** acetaminophen (TYLENOL) oral liquid 160 mg/5 mL **OR** acetaminophen, senna-docusate   Vital Signs    Vitals:   04/10/18 0113 04/10/18 0213 04/10/18 0343 04/10/18 0810  BP: 137/87 (!) 146/90 (!) 150/93 138/80  Pulse: 71 66 72 78  Resp: 19 18 17 18   Temp: 97.6 F (36.4 C) 97.9 F (36.6 C) 98.7 F (37.1 C) 98.1 F (36.7 C)  TempSrc: Oral Oral Oral Oral  SpO2: 98% 98% 98% 97%  Weight:        Intake/Output Summary (Last 24 hours) at 04/10/2018 1122 Last data filed at 04/10/2018 0400 Gross per 24 hour  Intake 891.54 ml  Output 450 ml  Net 441.54 ml   Filed Weights   04/09/18 1547  Weight: 85.7 kg    Telemetry    Normal sinus rhythm- Personally Reviewed  ECG    New EKG to review- Personally Reviewed  Physical Exam   GEN: No acute distress.   Neck: No JVD Cardiac: RRR, no murmurs, rubs, or gallops.  Respiratory: Clear to auscultation bilaterally. GI: Soft, nontender, non-distended  MS: No edema; No deformity. Neuro:  Nonfocal  Psych: Normal affect   Labs    ChemistryNo results for input(s): NA, K, CL, CO2, GLUCOSE, BUN, CREATININE, CALCIUM, PROT, ALBUMIN, AST, ALT, ALKPHOS, BILITOT, GFRNONAA, GFRAA, ANIONGAP in the last 168 hours.   Hematology Recent Labs  Lab 04/10/18 0519  WBC 7.1  RBC 4.81  HGB 13.8  HCT 42.4  MCV 88.1  MCH 28.7  MCHC 32.5  RDW 12.4  PLT 233    Cardiac EnzymesNo results for input(s): TROPONINI in the last 168 hours. No results for input(s): TROPIPOC in the last 168 hours.     BNPNo results for input(s): BNP, PROBNP in the last 168 hours.   DDimer No results for input(s): DDIMER in the last 168 hours.   Radiology    No results found.  Cardiac Studies   2D ECHO - [place] Normal LVF with 2.2 x 1.3 cm  pedunculated left atrial mass attached by stalk to the interatrial septum of the fossa ovalis consistent with myxoma  Patient Profile     63 y.o. male with a history of HTN who was in his USOH until yesterday when he was at work and started feeling funny and was unable to speak correctly.  He went home and then drove himselt to his sisters house and she noticed he was having difficulty speaking and expressing his words c/w expressive and receptive aphasia. He also had mild right sided weakness.  He was taken to Urgent Care by his family and then to [facility] ER where a head CT showed an acute CVA.  MRI of the brain revealed strokes in multiple distributions but predominantly left cerebral hemisphere but also in the right anterior and posterior circulation on the left.    Found by 2D echocardiogram to have left atrial myxoma.  Now transferred for CVTS evaluation  Assessment & Plan    1.  Left atrial myxoma -2D echo showed 2.2 x  1.3cm pedunculated LA mass attached by a stalk to the IAS at the fossa ovalis c/w myxoma -Likely etiology of acute CVAs in multiple vascular territories -Is n.p.o. for TEE later this afternoon -Will need ischemic workup prior to surgical resection.   -Discussed with CVTS and will set up for cardiac catheterization tomorrow -continue ASA  2.  Acute CVAs in multiple vascular territories c/w embolic phenomenon -likely secondary to atrial myxoma -Discharge summary stated "no Heparin due to risk of hemorrhagic conversion" per neuro but now on IV Heparin gtt - defer to IM and Neuro -Head and neck CTA with no obstructive findings -Neuro to follow   3.  HTN -BP proved this morning at 138/80 mmHg -was on Lotrel prior to admission -re initiation  of BP meds per Neuro and IM   4.  Hyperlipidemia -LDL was 162 on admission -started on Lipitor 40 mg daily -Need FLP and ALT in 6 weeks      For questions or updates, please contact [place] Please consult [email or web address] for contact info under Cardiology/STEMI.      Signed, Fransico Him, MD  04/10/2018, 11:22 AM

## 2018-04-10 NOTE — Progress Notes (Signed)
  Echocardiogram Echocardiogram Transesophageal has been performed.  Shawn Mccann 04/10/2018, 3:00 PM

## 2018-04-10 NOTE — Progress Notes (Signed)
Rehab Admissions Coordinator Note:  Per PT recommendation, this patient was screened by Jhonnie Garner for appropriateness for an Inpatient Acute Rehab Consult. At this time, it appears cardiac workup is still ongoing. Will continue to follow for possible consult request once work up has been completed.     Jhonnie Garner 04/10/2018, 11:34 AM  I can be reached at [phone].

## 2018-04-10 NOTE — H&P (View-Only) (Signed)
Progress Note  Patient Name: Shawn Mccann Date of Encounter: 04/10/2018  Primary Cardiologist: No primary care provider on file.   Subjective   Denies any chest pain or shortness of breath.  Feels like his speech is gotten better.  Inpatient Medications    Scheduled Meds: . atorvastatin  40 mg Oral q1800   Continuous Infusions: . sodium chloride 50 mL/hr at 04/10/18 0400  . heparin 1,200 Units/hr (04/10/18 0400)   PRN Meds: acetaminophen **OR** acetaminophen (TYLENOL) oral liquid 160 mg/5 mL **OR** acetaminophen, senna-docusate   Vital Signs    Vitals:   04/10/18 0113 04/10/18 0213 04/10/18 0343 04/10/18 0810  BP: 137/87 (!) 146/90 (!) 150/93 138/80  Pulse: 71 66 72 78  Resp: 19 18 17 18   Temp: 97.6 F (36.4 C) 97.9 F (36.6 C) 98.7 F (37.1 C) 98.1 F (36.7 C)  TempSrc: Oral Oral Oral Oral  SpO2: 98% 98% 98% 97%  Weight:        Intake/Output Summary (Last 24 hours) at 04/10/2018 1122 Last data filed at 04/10/2018 0400 Gross per 24 hour  Intake 891.54 ml  Output 450 ml  Net 441.54 ml   Filed Weights   04/09/18 1547  Weight: 85.7 kg    Telemetry    Normal sinus rhythm- Personally Reviewed  ECG    New EKG to review- Personally Reviewed  Physical Exam   GEN: No acute distress.   Neck: No JVD Cardiac: RRR, no murmurs, rubs, or gallops.  Respiratory: Clear to auscultation bilaterally. GI: Soft, nontender, non-distended  MS: No edema; No deformity. Neuro:  Nonfocal  Psych: Normal affect   Labs    ChemistryNo results for input(s): NA, K, CL, CO2, GLUCOSE, BUN, CREATININE, CALCIUM, PROT, ALBUMIN, AST, ALT, ALKPHOS, BILITOT, GFRNONAA, GFRAA, ANIONGAP in the last 168 hours.   Hematology Recent Labs  Lab 04/10/18 0519  WBC 7.1  RBC 4.81  HGB 13.8  HCT 42.4  MCV 88.1  MCH 28.7  MCHC 32.5  RDW 12.4  PLT 233    Cardiac EnzymesNo results for input(s): TROPONINI in the last 168 hours. No results for input(s): TROPIPOC in the last 168 hours.     BNPNo results for input(s): BNP, PROBNP in the last 168 hours.   DDimer No results for input(s): DDIMER in the last 168 hours.   Radiology    No results found.  Cardiac Studies   2D ECHO - [place] Normal LVF with 2.2 x 1.3 cm  pedunculated left atrial mass attached by stalk to the interatrial septum of the fossa ovalis consistent with myxoma  Patient Profile     63 y.o. male with a history of HTN who was in his USOH until yesterday when he was at work and started feeling funny and was unable to speak correctly.  He went home and then drove himselt to his sisters house and she noticed he was having difficulty speaking and expressing his words c/w expressive and receptive aphasia. He also had mild right sided weakness.  He was taken to Urgent Care by his family and then to [facility] ER where a head CT showed an acute CVA.  MRI of the brain revealed strokes in multiple distributions but predominantly left cerebral hemisphere but also in the right anterior and posterior circulation on the left.    Found by 2D echocardiogram to have left atrial myxoma.  Now transferred for CVTS evaluation  Assessment & Plan    1.  Left atrial myxoma -2D echo showed 2.2 x  1.3cm pedunculated LA mass attached by a stalk to the IAS at the fossa ovalis c/w myxoma -Likely etiology of acute CVAs in multiple vascular territories -Is n.p.o. for TEE later this afternoon -Will need ischemic workup prior to surgical resection.   -Discussed with CVTS and will set up for cardiac catheterization tomorrow -continue ASA  2.  Acute CVAs in multiple vascular territories c/w embolic phenomenon -likely secondary to atrial myxoma -Discharge summary stated "no Heparin due to risk of hemorrhagic conversion" per neuro but now on IV Heparin gtt - defer to IM and Neuro -Head and neck CTA with no obstructive findings -Neuro to follow   3.  HTN -BP proved this morning at 138/80 mmHg -was on Lotrel prior to admission -re initiation  of BP meds per Neuro and IM   4.  Hyperlipidemia -LDL was 162 on admission -started on Lipitor 40 mg daily -Need FLP and ALT in 6 weeks      For questions or updates, please contact [place] Please consult [email or web address] for contact info under Cardiology/STEMI.      Signed, Fransico Him, MD  04/10/2018, 11:22 AM

## 2018-04-10 NOTE — Evaluation (Addendum)
Speech Language Pathology Evaluation Patient Details Name: Shawn Mccann MRN: [id number] DOB: 1955-11-14 Today's Date: 04/10/2018 Time: 2694-8546 SLP Time Calculation (min) (ACUTE ONLY): 32 min  Problem List:  Patient Active Problem List   Diagnosis Date Noted  . CVA (cerebral vascular accident) ([place]) 04/09/2018  . Atrial myxoma 04/09/2018  . Hypertension   . Dyslipidemia    Past Medical History:  Past Medical History:  Diagnosis Date  . Atrial myxoma 04/08/2018  . CVA (cerebral vascular accident) ([place]) 04/08/2018  . Dyslipidemia   . Hypertension    Past Surgical History:  Past Surgical History:  Procedure Laterality Date  . NO PAST SURGERIES     HPI:  Pt is a 63 y.o. male with medical history significant of HTN who presented with dysarthria and aphasia; he was found to have multiple infracts on evaluation at [facility] and was transferred to [facility]. 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    Assessment / Plan / Recommendation Clinical Impression  Pt participated in speech/language evaluation with his sisters present. The pt denied any baseline speech/language deficits and these reports were corroborated by the pt's family. Pt presents with moderate non-fluent aphasia characterized by impairments in auditory comprehension, reading comprehension, and verbal expression with more significant difficulty in the area of verbal expression. He was able to inconsistently follow 2-step commands and consistently answered simple yes/no questions but he demonstrated increased difficulty with auditory comprehension as complexity increased. Concerning, reading, he was able to appropriately respond to sentence-level material but exhibited difficulty aty the paragraph level. Expressively, the pt required additional processing time to formulate sentences and respond to questions due to difficulty with word retrieval. He exhibited paraphasias (phonemic and semantic), and perseveration was   demonstrated. Inconsistent error patterns were also noted during attempts at speech production and groping was observed. The presence of apraxia of speech is therefore also consisdered. Skilled SLP services are clinically indicated at this time for aphasia intervention and to further assess motor speech.    SLP Assessment  SLP Recommendation/Assessment: Patient needs continued Speech Lanaguage Pathology Services SLP Visit Diagnosis: Aphasia (R47.01)    Follow Up Recommendations  Inpatient Rehab    Frequency and Duration min 2x/week  2 weeks      SLP Evaluation Cognition  Overall Cognitive Status: Difficult to assess(Due to language impairments) Orientation Level: Oriented X4       Comprehension  Auditory Comprehension Overall Auditory Comprehension: Impaired Yes/No Questions: Impaired(Simple: 4/4; Complex: 3/4) Commands: Impaired Two Step Basic Commands: (4/4) Multistep Basic Commands: (2/4) EffectiveTechniques: Extra processing time;Repetition;Visual/Gestural cues Reading Comprehension Reading Status: Impaired Word level: Not tested Sentence Level: Impaired(3/4) Paragraph Level: Impaired(1/3) Effective Techniques: Verbal cueing;Visual cueing    Expression Expression Primary Mode of Expression: Verbal Verbal Expression Overall Verbal Expression: Impaired Initiation: Impaired Automatic Speech: Counting;Day of week;Month of year(100% accuracy) Level of Generative/Spontaneous Verbalization: Sentence Repetition: Impaired Level of Impairment: Sentence level(3/5) Naming: Impairment Responsive: (4/5 ) Confrontation: Within functional limits Divergent: Not tested Verbal Errors: Phonemic paraphasias;Semantic paraphasias;Perseveration Pragmatics: No impairment   Oral / Motor  Motor Speech Respiration: Within functional limits Phonation: Normal Resonance: Within functional limits Articulation: Impaired Level of Impairment: Phrase Intelligibility: Intelligible Motor  Planning: Impaired Level of Impairment: Sentence Motor Speech Errors: Aware;Groping for words;Inconsistent   Shawn Mccann, Shawn Mccann, Shawn Mccann Office number [phone] Pager [phone]                   Shawn Mccann 04/10/2018, 2:02 PM

## 2018-04-10 NOTE — Progress Notes (Signed)
Initial Nutrition Assessment  DOCUMENTATION CODES:   Not applicable  INTERVENTION:  -Once diet advances, Ensure Enlive po BID, each supplement provides 350 kcal and 20 grams of protein  NUTRITION DIAGNOSIS:   Increased nutrient needs related to other (see comment)(surgery) as evidenced by estimated needs.  GOAL:   Patient will meet greater than or equal to 90% of their needs  MONITOR:   PO intake, Diet advancement  REASON FOR ASSESSMENT:   Consult Other (Comment)(CVA)  ASSESSMENT:   Pt is a 63 yo male with PMH of HTN and dyslipidemia. Presented to [facility] with dysarthria, expressive aphasia on 1/28. Workup revealed multiple infarcts and myxoma in left atrium. Transferred to [facility] 1/29.  Spoke with pt at bedside. Still presenting with aphasia. Pt was pleasant and willing to try to answer any questions.  Pt reported ht is 5'5". Pt did not express any concern about his wt or appetite. Unclear about typical intake; 2-3 meals a day.  NFPE revealed no depletion or edema. Pt currently NPO for cardiac catheterization tomorrow (1/31) 7:30am. Recommend advancing diet afterwards as tolerated and Ensure Enlive BID to assist with healing after the procedure.  Medications reviewed and include: Lipitor 40mg  daily Labs reviewed   NUTRITION - FOCUSED PHYSICAL EXAM:    Most Recent Value  Orbital Region  No depletion  Upper Arm Region  No depletion  Thoracic and Lumbar Region  No depletion  Buccal Region  No depletion  Temple Region  No depletion  Clavicle [place] Region  No depletion  Clavicle and Acromion [place] Region  No depletion  Scapular [place] Region  No depletion  Dorsal Hand  No depletion  Patellar Region  No depletion  Anterior Thigh Region  No depletion  Posterior Calf Region  No depletion  Edema (RD Assessment)  None  Hair  Reviewed  Eyes  Reviewed  Mouth  Reviewed  Skin  Reviewed  Nails  Reviewed     Diet Order:   Diet Order            Diet NPO time  specified  Diet effective midnight              EDUCATION NEEDS:   No education needs have been identified at this time  Last BM:  1/28  Height: Pt reported ht 5'5"  Ht Readings from Last 1 Encounters:  No data found for Ht    Weight:   Wt Readings from Last 1 Encounters:  04/09/18 85.7 kg    Ideal Body Weight:  59.09 kg  BMI:  There is no height or weight on file to calculate BMI.  Estimated Nutritional Needs:   Kcal:  1700-1900  Protein:  85-95 g  Fluid:  >/= 1.7L    [employer] Dietetic Intern

## 2018-04-10 NOTE — Progress Notes (Signed)
OT Cancellation    04/10/18 1500  OT Visit Information  Last OT Received On 04/10/18  Reason Eval/Treat Not Completed Patient at procedure or test/ unavailable (TEE)  Maurie Boettcher, OT/L   Acute OT Clinical Specialist [place] Pager [phone] Office [phone]

## 2018-04-10 NOTE — Plan of Care (Signed)
Mod assist with adls 

## 2018-04-10 NOTE — Evaluation (Signed)
Physical Therapy Evaluation Patient Details Name: Shawn Mccann MRN: [id number] DOB: 09-Feb-1956 Today's Date: 04/10/2018   History of Present Illness  Pt is a 63 y/o male admitted secondary to expressive aphasia. MRI revealed multiple bilateral infarcts. PMH including but not limited to hypertension, hyperlipidemia and atrial myxoma.    Clinical Impression  Pt presented supine in bed with HOB elevated, awake and willing to participate in therapy session. Prior to admission, pt indicated that he was independent and continues to work. Pt stated that he lives alone but has sisters that are close by. He currently requires min guard for transfers and min-mod A to ambulate in hallway without use of an AD. Pt with modest instability and frequent LOB laterally with no awareness or insight into deficits. Pt would greatly benefit from further intensive therapy services at CIR to maximize his independence with functional mobility prior to returning home with family support. PT will continue to follow acutely to progress mobility as tolerated.     Follow Up Recommendations [facility]    Equipment Recommendations  None recommended by PT    Recommendations for Other Services Rehab consult     Precautions / Restrictions Precautions Precautions: Fall Restrictions Weight Bearing Restrictions: No      Mobility  Bed Mobility Overal bed mobility: Needs Assistance Bed Mobility: Supine to Sit;Sit to Supine     Supine to sit: Supervision Sit to supine: Min guard   General bed mobility comments: increased time and effort, assistance for line management when returning to supine  Transfers Overall transfer level: Needs assistance Equipment used: None Transfers: Sit to/from Stand Sit to Stand: Min guard         General transfer comment: min guard for safety  Ambulation/Gait Ambulation/Gait assistance: Min assist;Mod assist Gait Distance (Feet): 75 Feet Assistive device: None Gait  Pattern/deviations: Step-through pattern;Decreased step length - right;Decreased step length - left;Decreased stride length;Staggering left;Staggering right;Drifts right/left;Narrow base of support Gait velocity: decreased   General Gait Details: pt with modest instability with ambulation without use of an AD, requiring constant min A and frequent mod A due to several LOB laterally  Stairs            Wheelchair Mobility    Modified Rankin (Stroke Patients Only) Modified Rankin (Stroke Patients Only) Pre-Morbid Rankin Score: No symptoms Modified Rankin: Moderately severe disability     Balance Overall balance assessment: Needs assistance Sitting-balance support: Feet supported Sitting balance-Leahy Scale: Good     Standing balance support: No upper extremity supported Standing balance-Leahy Scale: Poor                               Pertinent Vitals/Pain Pain Assessment: Faces Faces Pain Scale: Hurts even more Pain Location: L ankle, medial aspect Pain Descriptors / Indicators: Grimacing;Guarding Pain Intervention(s): Monitored during session;Repositioned    Home Living Family/patient expects to be discharged to:: Private residence Living Arrangements: Alone Available Help at Discharge: Family;Available PRN/intermittently Type of Home: House Home Access: Stairs to enter   Entrance Stairs-Number of Steps: 3 Home Layout: One level Home Equipment: None      Prior Function Level of Independence: Independent         Comments: works     Journalist, newspaper        Extremity/Trunk Assessment   Upper Extremity Assessment Upper Extremity Assessment: Defer to OT evaluation;Overall [facility] for tasks assessed    Lower Extremity Assessment Lower Extremity Assessment: Overall WFL for  tasks assessed       Communication   Communication: Expressive difficulties  Cognition Arousal/Alertness: Awake/alert Behavior During Therapy: WFL for tasks  assessed/performed Overall Cognitive Status: Impaired/Different from baseline Area of Impairment: Memory;Following commands;Safety/judgement;Problem solving                     Memory: Decreased short-term memory Following Commands: Follows one step commands consistently;Follows one step commands with increased time;Follows multi-step commands inconsistently Safety/Judgement: Decreased awareness of safety;Decreased awareness of deficits   Problem Solving: Difficulty sequencing;Requires verbal cues        General Comments      Exercises     Assessment/Plan    PT Assessment Patient needs continued PT services  PT Problem List Decreased balance;Decreased mobility;Decreased coordination;Decreased cognition;Decreased knowledge of use of DME;Decreased safety awareness;Decreased knowledge of precautions       PT Treatment Interventions DME instruction;Gait training;Stair training;Functional mobility training;Therapeutic activities;Therapeutic exercise;Balance training;Neuromuscular re-education;Cognitive remediation;Patient/family education    PT Goals (Current goals can be found in the Care Plan section)  Acute Rehab PT Goals Patient Stated Goal: unable to state PT Goal Formulation: Patient unable to participate in goal setting Time For Goal Achievement: 04/24/18 Potential to Achieve Goals: Good    Frequency Min 4X/week   Barriers to discharge        Co-evaluation               AM-PAC PT "6 Clicks" Mobility  Outcome Measure Help needed turning from your back to your side while in a flat bed without using bedrails?: None Help needed moving from lying on your back to sitting on the side of a flat bed without using bedrails?: None Help needed moving to and from a bed to a chair (including a wheelchair)?: A Little Help needed standing up from a chair using your arms (e.g., wheelchair or bedside chair)?: None Help needed to walk in hospital room?: A Lot Help  needed climbing 3-5 steps with a railing? : A Lot 6 Click Score: 19    End of Session Equipment Utilized During Treatment: Gait belt Activity Tolerance: Patient tolerated treatment well Patient left: in bed;with call bell/phone within reach;with bed alarm set Nurse Communication: Mobility status PT Visit Diagnosis: Other abnormalities of gait and mobility (R26.89);Other symptoms and signs involving the nervous system (R29.898)    Time: 1829-9371 PT Time Calculation (min) (ACUTE ONLY): 18 min   Charges:   PT Evaluation $PT Eval Moderate Complexity: 1 Mod          Sherie Don, PT, DPT  Acute Rehabilitation Services Pager [phone] Office [place] 04/10/2018, 10:57 AM

## 2018-04-10 NOTE — Progress Notes (Signed)
ANTICOAGULATION CONSULT NOTE   Pharmacy Consult for Heparin Indication: CVA with atrial myxoma  No Known Allergies  Patient Measurements: Weight: 188 lb 15 oz (85.7 kg) Heparin Dosing Weight: 85.7 kg  Vital Signs: Temp: 98.1 F (36.7 C) (01/30 0810) Temp Source: Oral (01/30 0810) BP: 138/80 (01/30 0810) Pulse Rate: 78 (01/30 0810)  Labs: Recent Labs    04/09/18 2215 04/10/18 0519  HGB  --  13.8  HCT  --  42.4  PLT  --  233  HEPARINUNFRC 0.26* 0.41    CrCl cannot be calculated (No successful lab value found.).   Medical History: Past Medical History:  Diagnosis Date  . Atrial myxoma 04/08/2018  . CVA (cerebral vascular accident) ([place]) 04/08/2018  . Dyslipidemia   . Hypertension    Assessment: 63 yo male with expressive aphasia. Patient found to have CVA with atrial myxoma. Pharmacy consulted to dose heparin. No anticoagulation PTA noted.   Heparin level therapeutic at 0.41 this AM, CBC wnl.  Goal of Therapy:  Heparin level 0.3-0.5 units/mL Monitor platelets by anticoagulation protocol: Yes   Plan:  Continue heparin gtt at 1200 units/hr Daily heparin level, CBC, s/s bleeding  Bertis Ruddy, PharmD Clinical Pharmacist Please check AMION for all [place] numbers 04/10/2018 8:43 AM

## 2018-04-10 NOTE — Progress Notes (Addendum)
STROKE TEAM PROGRESS NOTE   INTERVAL HISTORY His wife and family is at the bedside.  He remains with expressive aphasia but otherwise ok. Plans for card cath tomorrow for surgery clearance. On IV heparin.  Vitals:   04/10/18 0213 04/10/18 0343 04/10/18 0810 04/10/18 1210  BP: (!) 146/90 (!) 150/93 138/80 (!) 162/99  Pulse: 66 72 78 (!) 110  Resp: 18 17 18 18   Temp: 97.9 F (36.6 C) 98.7 F (37.1 C) 98.1 F (36.7 C) 97.9 F (36.6 C)  TempSrc: Oral Oral Oral Oral  SpO2: 98% 98% 97% 96%  Weight:        CBC:  Recent Labs  Lab 04/10/18 0519  WBC 7.1  HGB 13.8  HCT 42.4  MCV 88.1  PLT 785    Basic Metabolic Panel: No results for input(s): NA, K, CL, CO2, GLUCOSE, BUN, CREATININE, CALCIUM, MG, PHOS in the last 168 hours. Lipid Panel:     Component Value Date/Time   CHOL 168 04/10/2018 0519   TRIG 72 04/10/2018 0519   HDL 36 (L) 04/10/2018 0519   CHOLHDL 4.7 04/10/2018 0519   VLDL 14 04/10/2018 0519   LDLCALC 118 (H) 04/10/2018 0519   HgbA1c:  Lab Results  Component Value Date   HGBA1C 5.6 04/10/2018   Urine Drug Screen:     Component Value Date/Time   LABOPIA NONE DETECTED 04/09/2018 1955   COCAINSCRNUR NONE DETECTED 04/09/2018 1955   LABBENZ NONE DETECTED 04/09/2018 1955   AMPHETMU NONE DETECTED 04/09/2018 1955   THCU NONE DETECTED 04/09/2018 1955   LABBARB NONE DETECTED 04/09/2018 1955    Alcohol Level No results found for: ETH  IMAGING No results found.  PHYSICAL EXAM Constitutional: Appears well-developed and well-nourished.  Psych: Affect appropriate to situation Eyes: No scleral injection HENT: No OP obstrucion Head: Normocephalic.  Cardiovascular: Normal rate and regular rhythm.  Respiratory: Effort normal, non-labored breathing Skin: WDI  Neuro: Mental Status: Patient is awake, alert, oriented to person, place, month, year, and situation. Patient is able to give a clear and coherent history. Has expressive aphasia - can name most objects,  difficulty with less common objects, can repeat simple sentences. Will follow 1, 2 & 3-step commands without difficulty. Paucity of speech output.  Cranial Nerves: II: Visual Fields are full.  III,IV, VI: EOMI without ptosis or diploplia. Pupils are equal, round, and reactive to light.  He did show saccadic eye movements V: Facial sensation is symmetric to temperature VII: Facial movement is symmetric.  VIII: hearing is intact to voice X: Uvula elevates symmetrically XI: Shoulder shrug is symmetric. XII: tongue is midline without atrophy or fasciculations.  Motor: Tone is normal. Bulk is normal. 5/5 strength was present on left upper and lower extremities.  Right upper extremity is 5/5 and right lower extremity was 5/5. He orbits his right arm over his left. Sensory: Sensation is symmetric to light touch and temperature in the arms and legs. Plantars: Toes are downgoing bilaterally.  Cerebellar: FNF intact bilaterally   ASSESSMENT/PLAN Mr. Anes Rigel is a 63 y.o. male with history of HTN, HLD  presenting to [facility] from urgent care with difficulty speaking and expressive aphasia. Transferred to [facility] when myxoma found on echo.  Stroke:   Multiple bilateral embolic infarcts secondary to newly identified myxoma  CT head Evolving hypodensity involving the supra ganglionic left frontal cortex, compatible with evolving acute left MCA territory infarct.  CTA head & neck no LVO  MRI  Mult B infarcts in disparate vascular  territories. The dominant abnormality remains LEFT frontal opercular cortex, without visible hemorrhage  2D Echo  60-65% EF with 2.2 X 1.3 cm Echo dense mass likely Myxoma ([place])  TEE - to further eval myxoma  LDL 118  HgbA1c 5.6  IV heparin for VTE prophylaxis  No antithrombotic prior to admission, now on heparin IV.   Therapy recommendations:  pending   Disposition:  pending   Atrial Myxoma  CVTS consulting, plan resection  For  TEE  Cardiac clearance prior to OR  Hypertension  Stable . Permissive hypertension (OK if < 220/120) but gradually normalize in 5-7 days . Long-term BP goal normotensive  Hyperlipidemia  Home meds:  pravachol 80  Changed to lipitor 80 on arrival  LDL 118, goal < 70  Decreased to lipitor 40  Continue statin at discharge  Other Stroke Risk Factors  ETOH use, advised to drink no more than 2 drink(s) a day  Family hx stroke (maternal grandfather, other)  Hospital day # [place], MSN, APRN, ANVP-BC, AGPCNP-BC Advanced Practice Stroke Nurse [place] for Schedule & Pager information 04/10/2018 12:51 PM   ATTENDING NOTE: I reviewed above note and agree with the assessment and plan. Pt was seen and examined.   63 year old male with history of hypertension hyperlipidemia admitted for expressive aphasia.  CT showed left frontal infarct.  MRI confirmed left frontal small infarcts involving left MCA/PCA, left frontal and parietal punctate infarcts.  CTA head and neck negative.  2D echo showed pedunculated mass at the left atrium measuring 2.21.3 concerning for myxoma.  TEE done today confirmed left atrial myxoma.  LDL 118 and A1c 5.6.  UDS negative.  On exam, patient awake alert, follows all simple commands.  However still has expressive aphasia with hesitating speech, word finding difficulties and paraphasic errors.  Naming 2 out of 3, able to repeat simple sentences but not complicated sentences.  Right facial droop, right palpebral fissure increased than the left.  Moving all extremities, sensation coordination intact.  Gait not tested.  Patient stroke consistent with cardioembolic stroke in the setting of new diagnosis of left atrium myxoma. Given small size of infarcts without hemorrhagic conversion on MRI and high risk of embolism from myxoma, recommend to continue heparin IV and Lipitor 40 for stroke prevention.  Cardiology on board, recommend  resection of myxoma.  Currently undergoing cardiac cath for ischemic work-up in preparation for surgical resection.  Post surgery, anticoagulation is not needed however still recommend antiplatelet therapy for stroke prevention.  Antiplatelet regimen as per cardiology.   Rosalin Hawking, MD PhD Stroke Neurology 04/10/2018 10:09 PM     To contact Stroke Continuity provider, please refer to [email or web address] After hours, contact General Neurology

## 2018-04-10 NOTE — Progress Notes (Signed)
ANTICOAGULATION CONSULT NOTE   Pharmacy Consult for Heparin Indication: CVA with atrial myxoma  No Known Allergies  Patient Measurements: Weight: 188 lb 15 oz (85.7 kg) Heparin Dosing Weight: 85.7 kg  Vital Signs: Temp: 98.2 F (36.8 C) (01/30 2037) Temp Source: Oral (01/30 2037) BP: 152/97 (01/30 2106) Pulse Rate: 111 (01/30 2037)  Labs: Recent Labs    04/09/18 2215 04/10/18 0519 04/10/18 1237  HGB  --  13.8  --   HCT  --  42.4  --   PLT  --  233  --   HEPARINUNFRC 0.26* 0.41  --   CREATININE  --   --  1.02    CrCl cannot be calculated (Unknown ideal weight.).   Medical History: Past Medical History:  Diagnosis Date  . Atrial myxoma 04/08/2018  . CVA (cerebral vascular accident) ([employer]) 04/08/2018  . Dyslipidemia   . Hypertension    Assessment: 63 yo male with expressive aphasia. Patient found to have CVA with atrial myxoma. Pharmacy consulted to dose heparin. No anticoagulation PTA noted.   Heparin level therapeutic at 0.41 this AM, CBC wnl.  Heparin therapy d/c'd this afternoon, but will now continue per Neurology. Will restart at previous rate with no bolus and goal HL of 0.3-0.5.   Goal of Therapy:  Heparin level 0.3-0.5 units/mL Monitor platelets by anticoagulation protocol: Yes   Plan:  Continue heparin gtt at 1200 units/hr Daily heparin level, CBC, s/s bleeding  Davida Falconi A. Levada Dy, PharmD, [place] Pager: [phone] Please utilize Amion for appropriate phone number to reach the unit pharmacist ([place])   04/10/2018 10:23 PM

## 2018-04-10 NOTE — Progress Notes (Addendum)
PROGRESS NOTE  Shawn Mccann XAJ:[id number] DOB: 10-30-1955 DOA: 04/09/2018 PCP: Shawn Doyne., Shawn Mccann  HPI/Recap of past 24 hours: Shawn Mccann is a 63 y.o. male with medical history significant of HTN presenting with dysarthria; he was found to have multiple infracts on evaluation at [facility] and was transferred to [facility].  Yesterday, he came home from work after noticing feeling funny; it started about 330.  He drove to his sister's house and he was unable to speak at all.  Went to an Urgent Care initially and they sent him to [facility] ER where CT head showed multiple acute infarcts.  They did MRI and Echo and saw 2.2 x 1.3 mm left atrial mass suspecting atrial myxoma. Cardiology and neurology following.  04/10/2018: Patient seen and examined at his bedside.  Has no new complaints.  Motor, sensory and speech appear intact.   Assessment/Plan: Principal Problem:   CVA (cerebral vascular accident) [facility]) Active Problems:   Atrial myxoma   Hypertension   Dyslipidemia  Acute cardioembolic CVAs -Patient with acute left MCA CVA on CT -MRI with multiple acute infarcts secondary to shower of emboli -Source of emboli is almost certainly atrial myxoma -Needs TEE and cardiac cath pre-operatively -Cardiology following and will discuss with CTS if okay with coronary CTA to determine if underlying CAD is present prior to surgery -PT OT ST  Left atrial myxoma -Cardiology has seen the patient -Plan for TEE in AM, NPO after midnight -He will also need ischemic evaluation pre-operatively either via coronary CTA or cath -Will hold hep drip due to risk of hemorrhagic conversion -Defer to neuro to restart hep drip  HTN -Allow permissive HTNfor now -Treat BP only if >220/120, and then with goal of 15% reduction -HoldToprol XLand plan to restart in 48-72 hours  HLD -LDL 118 -Resume statin but will change Pravachol to Lipitor 80 mg daily -Despite this not being a CVA apparently associated with CVD RF, it  is still important to control his RF empirically to prevent future CVAs    DVT prophylaxis:Heparin  drip stopped on 04/10/18; sq lovenox daily for dvt ppx Code Status:Full - Family Communication:None at bedside Disposition Plan:Home possibly in 2-3 days or when CTS signs off. Consults called:CT surgery; Cardiology; Neurology; PT/OT/ST/Nutrition   Objective: Vitals:   04/10/18 0113 04/10/18 0213 04/10/18 0343 04/10/18 0810  BP: 137/87 (!) 146/90 (!) 150/93 138/80  Pulse: 71 66 72 78  Resp: 19 18 17 18   Temp: 97.6 F (36.4 C) 97.9 F (36.6 C) 98.7 F (37.1 C) 98.1 F (36.7 C)  TempSrc: Oral Oral Oral Oral  SpO2: 98% 98% 98% 97%  Weight:        Intake/Output Summary (Last 24 hours) at 04/10/2018 1046 Last data filed at 04/10/2018 0400 Gross per 24 hour  Intake 891.54 ml  Output 450 ml  Net 441.54 ml   Filed Weights   04/09/18 1547  Weight: 85.7 kg    Exam:  . General: 63 y.o. year-old male well developed well nourished in no acute distress.  Alert and oriented x3. . Cardiovascular: Regular rate and rhythm with no rubs or gallops.  No thyromegaly or JVD noted.   Marland Kitchen Respiratory: Clear to auscultation with no wheezes or rales. Good inspiratory effort. . Abdomen: Soft nontender nondistended with normal bowel sounds x4 quadrants. . Musculoskeletal: No lower extremity edema. 2/4 pulses in all 4 extremities. . Skin: No ulcerative lesions noted or rashes, . Psychiatry: Mood is appropriate for condition and setting   Data  Reviewed: CBC: Recent Labs  Lab 04/10/18 0519  WBC 7.1  HGB 13.8  HCT 42.4  MCV 88.1  PLT 093   Basic Metabolic Panel: No results for input(s): NA, K, CL, CO2, GLUCOSE, BUN, CREATININE, CALCIUM, MG, PHOS in the last 168 hours. GFR: CrCl cannot be calculated (No successful lab value found.). Liver Function Tests: No results for input(s): AST, ALT, ALKPHOS, BILITOT, PROT, ALBUMIN in the last 168 hours. No results for input(s): LIPASE,  AMYLASE in the last 168 hours. No results for input(s): AMMONIA in the last 168 hours. Coagulation Profile: No results for input(s): INR, PROTIME in the last 168 hours. Cardiac Enzymes: No results for input(s): CKTOTAL, CKMB, CKMBINDEX, TROPONINI in the last 168 hours. BNP (last 3 results) No results for input(s): PROBNP in the last 8760 hours. HbA1C: Recent Labs    04/10/18 0519  HGBA1C 5.6   CBG: No results for input(s): GLUCAP in the last 168 hours. Lipid Profile: Recent Labs    04/10/18 0519  CHOL 168  HDL 36*  LDLCALC 118*  TRIG 72  CHOLHDL 4.7   Thyroid Function Tests: Recent Labs    04/09/18 2215  TSH 1.586   Anemia Panel: No results for input(s): VITAMINB12, FOLATE, FERRITIN, TIBC, IRON, RETICCTPCT in the last 72 hours. Urine analysis: No results found for: COLORURINE, APPEARANCEUR, LABSPEC, PHURINE, GLUCOSEU, HGBUR, BILIRUBINUR, KETONESUR, PROTEINUR, UROBILINOGEN, NITRITE, LEUKOCYTESUR Sepsis Labs: @LABRCNTIP (procalcitonin:4,lacticidven:4)  )No results found for this or any previous visit (from the past 240 hour(s)).    Studies: No results found.  Scheduled Meds: . atorvastatin  40 mg Oral q1800    Continuous Infusions: . sodium chloride 50 mL/hr at 04/10/18 0400  . heparin 1,200 Units/hr (04/10/18 0400)     LOS: 1 day     Kayleen Memos, Shawn Mccann [facility] Pager [phone]  If 7PM-7AM, please contact night-coverage [email or web address] Password [email or web address] 04/10/2018, 10:46 AM

## 2018-04-11 ENCOUNTER — Ambulatory Visit (HOSPITAL_COMMUNITY): Payer: BLUE CROSS/BLUE SHIELD

## 2018-04-11 ENCOUNTER — Other Ambulatory Visit: Payer: Self-pay | Admitting: *Deleted

## 2018-04-11 ENCOUNTER — Encounter (HOSPITAL_COMMUNITY)
Admission: AD | Disposition: A | Discharge status: Home Health | Payer: Self-pay | Source: Other Acute Inpatient Hospital | Attending: Thoracic Surgery (Cardiothoracic Vascular Surgery)

## 2018-04-11 ENCOUNTER — Encounter (HOSPITAL_COMMUNITY): Payer: BLUE CROSS/BLUE SHIELD

## 2018-04-11 ENCOUNTER — Inpatient Hospital Stay (HOSPITAL_COMMUNITY): Payer: BLUE CROSS/BLUE SHIELD

## 2018-04-11 ENCOUNTER — Encounter (HOSPITAL_COMMUNITY): Payer: Self-pay | Admitting: Interventional Cardiology

## 2018-04-11 DIAGNOSIS — I63412 Cerebral infarction due to embolism of left middle cerebral artery: Principal | ICD-10-CM

## 2018-04-11 DIAGNOSIS — I2510 Atherosclerotic heart disease of native coronary artery without angina pectoris: Secondary | ICD-10-CM

## 2018-04-11 DIAGNOSIS — D219 Benign neoplasm of connective and other soft tissue, unspecified: Secondary | ICD-10-CM

## 2018-04-11 DIAGNOSIS — Z01810 Encounter for preprocedural cardiovascular examination: Secondary | ICD-10-CM

## 2018-04-11 HISTORY — PX: LEFT HEART CATH AND CORONARY ANGIOGRAPHY: CATH118249

## 2018-04-11 LAB — HEPARIN LEVEL (UNFRACTIONATED)
HEPARIN UNFRACTIONATED: 0.22 [IU]/mL — AB (ref 0.30–0.70)
Heparin Unfractionated: 0.28 IU/mL — ABNORMAL LOW (ref 0.30–0.70)

## 2018-04-11 LAB — COMPREHENSIVE METABOLIC PANEL
ALBUMIN: 3.4 g/dL — AB (ref 3.5–5.0)
ALT: 19 U/L (ref 0–44)
AST: 25 U/L (ref 15–41)
Alkaline Phosphatase: 80 U/L (ref 38–126)
Anion gap: 10 (ref 5–15)
BUN: 12 mg/dL (ref 8–23)
CO2: 21 mmol/L — ABNORMAL LOW (ref 22–32)
Calcium: 8.5 mg/dL — ABNORMAL LOW (ref 8.9–10.3)
Chloride: 108 mmol/L (ref 98–111)
Creatinine, Ser: 1.06 mg/dL (ref 0.61–1.24)
GFR calc Af Amer: >60 mL/min (ref >60)
GFR calc non Af Amer: >60 mL/min (ref >60)
GLUCOSE: 104 mg/dL — AB (ref 70–99)
Potassium: 3.5 mmol/L (ref 3.5–5.1)
Sodium: 139 mmol/L (ref 135–145)
Total Bilirubin: 0.6 mg/dL (ref 0.3–1.2)
Total Protein: 6.8 g/dL (ref 6.5–8.1)

## 2018-04-11 LAB — PROTIME-INR
INR: 1.11
PROTHROMBIN TIME: 14.2 s (ref 11.4–15.2)

## 2018-04-11 LAB — HEMOGLOBIN A1C
Hgb A1c MFr Bld: 5.5 % (ref 4.8–5.6)
Mean Plasma Glucose: 111.15 mg/dL

## 2018-04-11 LAB — BLOOD GAS, ARTERIAL
Acid-base deficit: 2.5 mmol/L — ABNORMAL HIGH (ref 0.0–2.0)
Bicarbonate: 21 mmol/L (ref 20.0–28.0)
Drawn by: 41422
FIO2: 21
O2 Saturation: 93.2 %
Patient temperature: 98.6
pCO2 arterial: 31.5 mmHg — ABNORMAL LOW (ref 32.0–48.0)
pH, Arterial: 7.439 (ref 7.350–7.450)
pO2, Arterial: 65.8 mmHg — ABNORMAL LOW (ref 83.0–108.0)

## 2018-04-11 LAB — GLUCOSE, CAPILLARY: Glucose-Capillary: 97 mg/dL (ref 70–99)

## 2018-04-11 LAB — PREALBUMIN: Prealbumin: 21.7 mg/dL (ref 18–38)

## 2018-04-11 LAB — URIC ACID: URIC ACID, SERUM: 7.5 mg/dL (ref 3.7–8.6)

## 2018-04-11 LAB — CBC
HCT: 43 % (ref 39.0–52.0)
Hemoglobin: 14.6 g/dL (ref 13.0–17.0)
MCH: 29.7 pg (ref 26.0–34.0)
MCHC: 34 g/dL (ref 30.0–36.0)
MCV: 87.4 fL (ref 80.0–100.0)
Platelets: 228 10*3/uL (ref 150–400)
RBC: 4.92 MIL/uL (ref 4.22–5.81)
RDW: 12.4 % (ref 11.5–15.5)
WBC: 12.2 10*3/uL — ABNORMAL HIGH (ref 4.0–10.5)
nRBC: 0 % (ref 0.0–0.2)

## 2018-04-11 LAB — APTT: aPTT: 73 seconds — ABNORMAL HIGH (ref 24–36)

## 2018-04-11 LAB — MRSA PCR SCREENING: MRSA by PCR: NEGATIVE

## 2018-04-11 SURGERY — LEFT HEART CATH AND CORONARY ANGIOGRAPHY
Anesthesia: LOCAL

## 2018-04-11 MED ORDER — ALLOPURINOL 100 MG PO TABS: 50.0000 mg | ORAL_TABLET | Freq: Every day | ORAL | Status: DC

## 2018-04-11 MED ORDER — IOHEXOL 350 MG/ML SOLN
INTRAVENOUS | Status: DC | PRN
  Administered 2018-04-11: 60 mL via INTRAVENOUS

## 2018-04-11 MED ORDER — VERAPAMIL HCL 2.5 MG/ML IV SOLN
INTRAVENOUS | Status: DC | PRN
  Administered 2018-04-11: 08:00:00 via INTRA_ARTERIAL

## 2018-04-11 MED ORDER — OXYCODONE HCL 5 MG PO TABS: 5.0000 mg | ORAL_TABLET | ORAL | Status: DC | PRN

## 2018-04-11 MED ORDER — ALLOPURINOL 100 MG PO TABS
50.0000 mg | ORAL_TABLET | Freq: Two times a day (BID) | ORAL | Status: DC
  Administered 2018-04-11: 50 mg via ORAL
  Filled 2018-04-11: qty 1

## 2018-04-11 MED ORDER — HEPARIN SODIUM (PORCINE) 1000 UNIT/ML IJ SOLN
INTRAMUSCULAR | Status: AC
  Filled 2018-04-11: qty 1

## 2018-04-11 MED ORDER — SODIUM CHLORIDE 0.9 % IV SOLN: INTRAVENOUS | Status: AC

## 2018-04-11 MED ORDER — MIDAZOLAM HCL 2 MG/2ML IJ SOLN
INTRAMUSCULAR | Status: AC
  Filled 2018-04-11: qty 2

## 2018-04-11 MED ORDER — VERAPAMIL HCL 2.5 MG/ML IV SOLN
INTRAVENOUS | Status: AC
  Filled 2018-04-11: qty 2

## 2018-04-11 MED ORDER — METOPROLOL SUCCINATE ER 25 MG PO TB24
25.0000 mg | ORAL_TABLET | Freq: Every day | ORAL | Status: DC
  Administered 2018-04-11: 25 mg via ORAL
  Filled 2018-04-11: qty 1

## 2018-04-11 MED ORDER — HEPARIN SODIUM (PORCINE) 1000 UNIT/ML IJ SOLN
INTRAMUSCULAR | Status: DC | PRN
  Administered 2018-04-11: 3000 [IU] via INTRAVENOUS

## 2018-04-11 MED ORDER — MIDAZOLAM HCL 2 MG/2ML IJ SOLN
INTRAMUSCULAR | Status: DC | PRN
  Administered 2018-04-11: 0.5 mg via INTRAVENOUS

## 2018-04-11 MED ORDER — HEPARIN (PORCINE) IN NACL 1000-0.9 UT/500ML-% IV SOLN
INTRAVENOUS | Status: AC
  Filled 2018-04-11: qty 500

## 2018-04-11 MED ORDER — SODIUM CHLORIDE 0.9 % IV SOLN: 250.0000 mL | INTRAVENOUS | Status: DC | PRN

## 2018-04-11 MED ORDER — FENTANYL CITRATE (PF) 100 MCG/2ML IJ SOLN
INTRAMUSCULAR | Status: AC
  Filled 2018-04-11: qty 2

## 2018-04-11 MED ORDER — FENTANYL CITRATE (PF) 100 MCG/2ML IJ SOLN
INTRAMUSCULAR | Status: DC | PRN
  Administered 2018-04-11: 25 ug via INTRAVENOUS

## 2018-04-11 MED ORDER — SODIUM CHLORIDE 0.9% FLUSH
3.0000 mL | Freq: Two times a day (BID) | INTRAVENOUS | Status: DC
  Administered 2018-04-11 – 2018-04-14 (×6): 3 mL via INTRAVENOUS

## 2018-04-11 MED ORDER — LABETALOL HCL 5 MG/ML IV SOLN
INTRAVENOUS | Status: AC
  Filled 2018-04-11: qty 4

## 2018-04-11 MED ORDER — HEPARIN (PORCINE) IN NACL 1000-0.9 UT/500ML-% IV SOLN
INTRAVENOUS | Status: DC | PRN
  Administered 2018-04-11 (×2): 500 mL

## 2018-04-11 MED ORDER — SODIUM CHLORIDE 0.9% FLUSH
3.0000 mL | INTRAVENOUS | Status: DC | PRN
  Administered 2018-04-12: 3 mL via INTRAVENOUS
  Filled 2018-04-11: qty 3

## 2018-04-11 MED ORDER — HEPARIN (PORCINE) 25000 UT/250ML-% IV SOLN
1400.0000 [IU]/h | INTRAVENOUS | Status: AC
  Administered 2018-04-11: 1300 [IU]/h via INTRAVENOUS
  Administered 2018-04-12 – 2018-04-14 (×3): 1400 [IU]/h via INTRAVENOUS
  Filled 2018-04-11 (×4): qty 250

## 2018-04-11 MED ORDER — ASPIRIN 81 MG PO CHEW
81.0000 mg | CHEWABLE_TABLET | Freq: Once | ORAL | Status: AC
  Administered 2018-04-11: 81 mg via ORAL
  Filled 2018-04-11: qty 1

## 2018-04-11 MED ORDER — ONDANSETRON HCL 4 MG/2ML IJ SOLN: 4.0000 mg | Freq: Four times a day (QID) | INTRAMUSCULAR | Status: DC | PRN

## 2018-04-11 MED ORDER — LABETALOL HCL 5 MG/ML IV SOLN
INTRAVENOUS | Status: DC | PRN
  Administered 2018-04-11: 10 mg via INTRAVENOUS

## 2018-04-11 MED ORDER — LIDOCAINE HCL (PF) 1 % IJ SOLN
INTRAMUSCULAR | Status: DC | PRN
  Administered 2018-04-11: 2 mL

## 2018-04-11 MED ORDER — ACETAMINOPHEN 325 MG PO TABS: 650.0000 mg | ORAL_TABLET | ORAL | Status: DC | PRN

## 2018-04-11 MED ORDER — COLCHICINE 0.6 MG PO TABS
0.6000 mg | ORAL_TABLET | Freq: Two times a day (BID) | ORAL | Status: DC
  Administered 2018-04-11 – 2018-04-14 (×7): 0.6 mg via ORAL
  Filled 2018-04-11 (×7): qty 1

## 2018-04-11 MED ORDER — LIDOCAINE HCL (PF) 1 % IJ SOLN
INTRAMUSCULAR | Status: AC
  Filled 2018-04-11: qty 30

## 2018-04-11 SURGICAL SUPPLY — 12 items
CATH 5FR JL3.5 JR4 ANG PIG MP (CATHETERS) ×2 IMPLANT
CATH LAUNCHER 5F RADR (CATHETERS) ×1 IMPLANT
CATHETER LAUNCHER 5F RADR (CATHETERS) ×2
DEVICE RAD COMP TR BAND LRG (VASCULAR PRODUCTS) ×2 IMPLANT
GLIDESHEATH SLEND A-KIT 6F 22G (SHEATH) ×2 IMPLANT
GUIDEWIRE INQWIRE 1.5J.035X260 (WIRE) ×1 IMPLANT
INQWIRE 1.5J .035X260CM (WIRE) ×2
KIT HEART LEFT (KITS) ×2 IMPLANT
PACK CARDIAC CATHETERIZATION (CUSTOM PROCEDURE TRAY) ×2 IMPLANT
SHEATH PROBE COVER 6X72 (BAG) ×2 IMPLANT
TRANSDUCER W/STOPCOCK (MISCELLANEOUS) ×2 IMPLANT
TUBING CIL FLEX 10 FLL-RA (TUBING) ×2 IMPLANT

## 2018-04-11 NOTE — Progress Notes (Signed)
ANTICOAGULATION CONSULT NOTE   Pharmacy Consult for Heparin Indication: CVA with atrial myxoma  No Known Allergies  Patient Measurements: Height: 5\' 5"  (165.1 cm) Weight: 188 lb 15 oz (85.7 kg) IBW/kg (Calculated) : 61.5 Heparin Dosing Weight: 85.7 kg  Vital Signs: Temp: 99.1 F (37.3 C) (01/31 2005) Temp Source: Oral (01/31 2005) BP: 160/98 (01/31 2005) Pulse Rate: 105 (01/31 2005)  Labs: Recent Labs    04/10/18 0519 04/10/18 1237 04/11/18 0643 04/11/18 2214  HGB 13.8  --  14.6  --   HCT 42.4  --  43.0  --   PLT 233  --  228  --   APTT  --   --  73*  --   LABPROT  --   --  14.2  --   INR  --   --  1.11  --   HEPARINUNFRC 0.41  --  0.28* 0.22*  CREATININE  --  1.02 1.06  --     Estimated Creatinine Clearance: 70.9 mL/min (by C-G formula based on SCr of 1.06 mg/dL).   Medical History: Past Medical History:  Diagnosis Date  . Atrial myxoma 04/08/2018  . CVA (cerebral vascular accident) ([place]) 04/08/2018  . Dyslipidemia   . Hypertension    Assessment: 63 yo male with expressive aphasia. Patient found to have CVA with atrial myxoma. Pharmacy consulted to dose heparin. No anticoagulation PTA noted. No bleeding currently.   1/31 PM update: heparin level below goal after re-start s/p cath, no issues per RN.  Goal of Therapy:  Heparin level 0.3-0.5 units/mL Monitor platelets by anticoagulation protocol: Yes   Plan:  No boluses  Inc heparin drip to 1400 units/hr Re-check heparin level with AM labs  Narda Bonds, PharmD, [place] Pharmacist Phone: [phone]

## 2018-04-11 NOTE — Progress Notes (Addendum)
TR BAND REMOVAL  LOCATION: right    radial  DEFLATED PER PROTOCOL: yes    TIME BAND OFF / DRESSING APPLIED:    1015  SITE UPON ARRIVAL:    Level 0  SITE AFTER BAND REMOVAL:    Level 0  CIRCULATION SENSATION AND MOVEMENT:    Within Normal Limits : yes  COMMENTS:   Slight bleed noted upon TR band removal. Manual pressure applied x 15 min.

## 2018-04-11 NOTE — Progress Notes (Signed)
ANTICOAGULATION CONSULT NOTE - Follow Up Consult  Pharmacy Consult for Heparin Indication:  embolic CVA from myxoma  No Known Allergies  Patient Measurements: Height: 5\' 5"  (165.1 cm) Weight: 188 lb 15 oz (85.7 kg) IBW/kg (Calculated) : 61.5 Heparin Dosing Weight: 85.7 kg  Vital Signs: Temp: 98.4 F (36.9 C) (01/31 1326) Temp Source: Oral (01/31 1326) BP: 151/96 (01/31 1326) Pulse Rate: 103 (01/31 1326)  Labs: Recent Labs    04/09/18 2215 04/10/18 0519 04/10/18 1237 04/11/18 0643  HGB  --  13.8  --  14.6  HCT  --  42.4  --  43.0  PLT  --  233  --  228  APTT  --   --   --  73*  LABPROT  --   --   --  14.2  INR  --   --   --  1.11  HEPARINUNFRC 0.26* 0.41  --  0.28*  CREATININE  --   --  1.02 1.06    Estimated Creatinine Clearance: 70.9 mL/min (by C-G formula based on SCr of 1.06 mg/dL).  Assessment:  63 yo male with expressive aphasia. Patient found to have CVA with atrial myxoma. Pharmacy consulted to dose heparin. No anticoagulation PTA noted.     Heparin drip off for cardiac cath this morning, now to resume 4 hrs after sheath out.TR band removed ~10:15am. Slight bleeding noted at removal, now resolved. TR band deflation completed   Surgery tentatively scheduled for 04/15/18 if neuro status remains stable.     Heparin level 0.28 this am on 1200 units/hr, about 7 hours after drip resumed last night. Just below target range.  Goal of Therapy:  Heparin level 0.3-0.5 units/ml Monitor platelets by anticoagulation protocol: Yes   Plan:   Resume heparin drip ~2:30pm at 1300 units/hr.  Heparin level ~8 hrs after drip resumes.    Daily heparin level and CBC while on heparin.  Arty Baumgartner, [place] Pager: [phone] or phone: [phone] 04/11/2018,1:48 PM

## 2018-04-11 NOTE — Progress Notes (Signed)
Progress Note  Patient Name: Shawn Mccann Date of Encounter: 04/11/2018  Primary Cardiologist: Fransico Him, MD   Subjective   Denies any chest pain or pressure. No SOB.  Feels back to baseline neurologically.  Had cath this am showing normal coronary arteries with a small anomalous LCx off the right coronary cusp.  Inpatient Medications    Scheduled Meds: . atorvastatin  40 mg Oral q1800  . sodium chloride flush  3 mL Intravenous Q12H   Continuous Infusions: . sodium chloride 1,000 mL (04/10/18 1413)  . sodium chloride    . heparin     PRN Meds: sodium chloride, acetaminophen **OR** acetaminophen (TYLENOL) oral liquid 160 mg/5 mL **OR** acetaminophen, ondansetron (ZOFRAN) IV, oxyCODONE, senna-docusate, sodium chloride flush   Vital Signs    Vitals:   04/11/18 1016 04/11/18 1017 04/11/18 1038 04/11/18 1326  BP: (!) 174/97  (!) 156/93 (!) 151/96  Pulse: 97 (!) 101  (!) 103  Resp: (!) 23 (!) 21  20  Temp:    98.4 F (36.9 C)  TempSrc:    Oral  SpO2: 96% 96%  94%  Weight:      Height:        Intake/Output Summary (Last 24 hours) at 04/11/2018 1347 Last data filed at 04/11/2018 1050 Gross per 24 hour  Intake -  Output 150 ml  Net -150 ml   Filed Weights   04/09/18 1547  Weight: 85.7 kg    Telemetry    NSR to sinus tachycardia - Personally Reviewed  ECG    No new EKG to review - Personally Reviewed  Physical Exam   GEN: No acute distress.   Neck: No JVD Cardiac: RRR, no murmurs, rubs, or gallops.  Respiratory: Clear to auscultation bilaterally. GI: Soft, nontender, non-distended  MS: No edema; No deformity. Neuro:  Nonfocal  Psych: Normal affect   Labs    Chemistry Recent Labs  Lab 04/10/18 1237 04/11/18 0643  NA 138 139  K 3.9 3.5  CL 109 108  CO2 18* 21*  GLUCOSE 93 104*  BUN 13 12  CREATININE 1.02 1.06  CALCIUM 8.5* 8.5*  PROT  --  6.8  ALBUMIN  --  3.4*  AST  --  25  ALT  --  19  ALKPHOS  --  80  BILITOT  --  0.6  GFRNONAA  >60 >60  GFRAA >60 >60  ANIONGAP 11 10     Hematology Recent Labs  Lab 04/10/18 0519 04/11/18 0643  WBC 7.1 12.2*  RBC 4.81 4.92  HGB 13.8 14.6  HCT 42.4 43.0  MCV 88.1 87.4  MCH 28.7 29.7  MCHC 32.5 34.0  RDW 12.4 12.4  PLT 233 228    Cardiac EnzymesNo results for input(s): TROPONINI in the last 168 hours. No results for input(s): TROPIPOC in the last 168 hours.   BNPNo results for input(s): BNP, PROBNP in the last 168 hours.   DDimer No results for input(s): DDIMER in the last 168 hours.   Radiology    Vas US Doppler Pre Cabg  Result Date: 04/11/2018 PREOPERATIVE VASCULAR EVALUATION  Indications:      Pre CABG evaluation. Limitations:      Tachycardiac Comparison Study: No comparison study available Performing Technologist: Rudell Cobb RDMS, RVT  Examination Guidelines: A complete evaluation includes B-mode imaging, spectral Doppler, color Doppler, and power Doppler as needed of all accessible portions of each vessel. Bilateral testing is considered an integral part of a complete examination. Limited examinations for  reoccurring indications may be performed as noted.  Right Carotid Findings: +----------+--------+--------+--------+--------+--------+           PSV cm/sEDV cm/sStenosisDescribeComments +----------+--------+--------+--------+--------+--------+ CCA Prox  130     25                               +----------+--------+--------+--------+--------+--------+ CCA Distal99      28                               +----------+--------+--------+--------+--------+--------+ ICA Prox  45      14      1-39%                    +----------+--------+--------+--------+--------+--------+ ICA Distal67      22                               +----------+--------+--------+--------+--------+--------+ ECA       122     16                               +----------+--------+--------+--------+--------+--------+ Portions of this table do not appear on this page.  +----------+--------+-------+--------+------------+           PSV cm/sEDV cmsDescribeArm Pressure +----------+--------+-------+--------+------------+ Subclavian105                                 +----------+--------+-------+--------+------------+ +---------+--------+--+--------+--+---------+ VertebralPSV cm/s58EDV cm/s18Antegrade +---------+--------+--+--------+--+---------+ Left Carotid Findings: +----------+--------+--------+--------+--------+--------+           PSV cm/sEDV cm/sStenosisDescribeComments +----------+--------+--------+--------+--------+--------+ CCA Prox  95      16                               +----------+--------+--------+--------+--------+--------+ CCA Distal86      23                               +----------+--------+--------+--------+--------+--------+ ICA Prox  70      26      1-39%                    +----------+--------+--------+--------+--------+--------+ ICA Mid   73      26                               +----------+--------+--------+--------+--------+--------+ ICA Distal73      27                               +----------+--------+--------+--------+--------+--------+ ECA       125     27                               +----------+--------+--------+--------+--------+--------+ +----------+--------+--------+--------+------------+ SubclavianPSV cm/sEDV cm/sDescribeArm Pressure +----------+--------+--------+--------+------------+           105                     83           +----------+--------+--------+--------+------------+ +---------+--------+--+--------+--+---------+ VertebralPSV  cm/s50EDV cm/s16Antegrade +---------+--------+--+--------+--+---------+  ABI Findings: +--------+------------------+-----+---------+-----------------------+ Right   Rt Pressure (mmHg)IndexWaveform Comment                 +--------+------------------+-----+---------+-----------------------+ Brachial                        triphasicCatheterization on site +--------+------------------+-----+---------+-----------------------+ PTA     205               2.47 triphasic                        +--------+------------------+-----+---------+-----------------------+ DP      199               2.40 triphasic                        +--------+------------------+-----+---------+-----------------------+ +--------+------------------+-----+---------+-------+ Left    Lt Pressure (mmHg)IndexWaveform Comment +--------+------------------+-----+---------+-------+ [id number]                     triphasic        +--------+------------------+-----+---------+-------+ PTA     192               2.31 triphasic        +--------+------------------+-----+---------+-------+ DP      182               2.19 triphasic        +--------+------------------+-----+---------+-------+ +-------+---------------+----------------+ ABI/TBIToday's ABI/TBIPrevious ABI/TBI +-------+---------------+----------------+ Right  2.47                            +-------+---------------+----------------+ Left   2.31                            +-------+---------------+----------------+  Right Doppler Findings: +--------+--------+-----+---------+-----------------------+ Site    PressureIndexDoppler  Comments                +--------+--------+-----+---------+-----------------------+ Brachial             triphasicCatheterization on site +--------+--------+-----+---------+-----------------------+ Radial               triphasic                        +--------+--------+-----+---------+-----------------------+ Ulnar                biphasic                         +--------+--------+-----+---------+-----------------------+  Left Doppler Findings: +--------+--------+-----+---------+--------+ Site    PressureIndexDoppler  Comments +--------+--------+-----+---------+--------+ [id number]           triphasic          +--------+--------+-----+---------+--------+ Radial               triphasic         +--------+--------+-----+---------+--------+ Ulnar                triphasic         +--------+--------+-----+---------+--------+  Summary: Right Carotid: Velocities in the right ICA are consistent with a 1-39% stenosis. Left Carotid: Velocities in the left ICA are consistent with a 1-39% stenosis. Vertebrals: Bilateral vertebral arteries demonstrate antegrade flow. Right ABI: Resting right ankle-brachial index indicates noncompressible right lower extremity arteries. Left ABI: Resting left ankle-brachial index indicates noncompressible left lower extremity arteries. Right Upper Extremity: Doppler waveforms remain within  normal limits with right radial compression. Doppler waveform obliterate with right ulnar compression. Left Upper Extremity: Doppler waveforms remain within normal limits with left radial compression. Doppler waveform obliterate with left ulnar compression.     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  These usually run posterior to the aorta and not intra-arterial.  We were unable to confirm the course.  LAD contains mid 40% eccentric narrowing.  Right coronary is dominant and without obstruction.  Normal LVEDP.  No tumor blush is noted, hence possibly explaining fragmentation of the myxoma leading to multiple emboli.  Tachycardia and hypotension secondary to beta-blocker withdrawal  RECOMMENDATIONS:   Resume IV heparin in 4 hours without bolus.  Consider adding beta-blocker therapy to solve beta-blocker withdrawal.   TEE 04/10/2018 Study Conclusions  - Left ventricle: The cavity size was normal. Wall thickness was   normal. Systolic function was normal. The estimated ejection   fraction was in the range of 55% to 60%. Wall motion was normal;   there were  no regional wall motion abnormalities. - Left atrium: There is a medium-sized mobilemyxoma attached to the   fossa ovalis. - Right atrium: No evidence of thrombus in the atrial cavity or   appendage. - Atrial septum: No defect or patent foramen ovale was identified.  Patient Profile     63 y.o. male with a history of HTN who was in his USOH until yesterday when he was at work and started feeling funny and was unable to speak correctly. He went home and then drove himselt to his sisters house and she noticed he was having difficulty speaking and expressing his words c/w expressive and receptive aphasia. He also had mild right sided weakness. He was taken to Urgent Care by his family and then to [facility] ER where a head CT showed an acute CVA. MRI of the brain revealed strokes in multiple distributions but predominantly left cerebral hemisphere but also in the right anterior and posterior circulation on the left.   Found by 2D echocardiogram to have left atrial myxoma.  Now transferred for CVTS evaluation  Assessment & Plan    1. Left atrial myxoma -2D echo showed 2.2 x 1.3cm pedunculated LA mass attached by a stalk to the IAS at the fossa ovalis c/w myxoma and confirmed by TEE yesterday -Likely etiology of acute CVAs in multiple vascular territories -cardiac cath today showed normal coronary arteries with anomalous small LCx off the RCC -plan for surgical resection next Tuesday by CVTS -continue ASA  2. Acute CVAsin multiple vascular territories c/w embolic phenomenon -likely secondary to atrial myxoma -Discharge summary stated "no Heparin due to risk of hemorrhagic conversion" per neuro but now on IV Heparin gtt - defer to IM and Neuro -Head and neck CTA with no obstructive findings -Neuro to follow   3. HTN -BP elevated this am -records from [place] [facility] stated that patient was on Lotrel prior to admission but phramacy here states he was on Toprol XL 200mg  daily -HR elevated as well  and likely rebound from holding BB in setting of acute CVA.  -start Toprol XL 25mg  daily and titrate as needed for HR and BP control -need to clarify home meds prior to admission  4. Hyperlipidemia -LDL was 162 on admission -started on Lipitor 40 mg daily -Need FLP and ALT in 6 weeks      For  questions or updates, please contact [place] Please consult [email or web address] for contact info under Cardiology/STEMI.      Signed, Fransico Him, MD  04/11/2018, 1:47 PM

## 2018-04-11 NOTE — Progress Notes (Signed)
PT Cancellation Note  Patient Details Name: Shawn Mccann MRN: [id number] DOB: 10/20/1955   Cancelled Treatment:    Reason Eval/Treat Not Completed: Patient at procedure or test/unavailable (cath lab). PT will continue to follow acutely as available.    [place] 04/11/2018, 9:07 AM

## 2018-04-11 NOTE — Progress Notes (Signed)
Pre CABG evaluation completed. Please see preliminary notes on CV PROC under chart review. Shawn Mccann H Shawn Mccann(RDMS RVT) 04/11/18 1:03 PM

## 2018-04-11 NOTE — Progress Notes (Signed)
Inpatient Rehabilitation-Admissions Coordinator   Noted pt is planning to undergo surgery for atrial myxoma on Tuesday, 04/15/18. AC will hold on requesting IP Rehab Consult Order until post op therapy evaluations complete.   Jhonnie Garner, OTR/L  Rehab Admissions Coordinator  [phone] 04/11/2018 4:37 PM

## 2018-04-11 NOTE — Progress Notes (Signed)
STROKE TEAM PROGRESS NOTE   INTERVAL HISTORY No family at the bedside.  Patient came back from cardiac cath, which was unremarkable.  Plan for surgical resection of myxoma next Tuesday.  Patient still has partial expressive aphasia, now complains of left ankle gout flare.  Vitals:   04/10/18 2106 04/11/18 0012 04/11/18 0401 04/11/18 0600  BP: (!) 152/97 (!) 145/98 (!) 158/101   Pulse:  96 (!) 110   Resp:  19 18   Temp:  98.3 F (36.8 C) 98.1 F (36.7 C)   TempSrc:  Oral Oral   SpO2:  94% 96%   Weight:      Height:    5\' 5"  (1.651 m)    CBC:  Recent Labs  Lab 04/10/18 0519 04/11/18 0643  WBC 7.1 12.2*  HGB 13.8 14.6  HCT 42.4 43.0  MCV 88.1 87.4  PLT 233 568    Basic Metabolic Panel:  Recent Labs  Lab 04/10/18 1237 04/11/18 0643  NA 138 139  K 3.9 3.5  CL 109 108  CO2 18* 21*  GLUCOSE 93 104*  BUN 13 12  CREATININE 1.02 1.06  CALCIUM 8.5* 8.5*   Lipid Panel:     Component Value Date/Time   CHOL 168 04/10/2018 0519   TRIG 72 04/10/2018 0519   HDL 36 (L) 04/10/2018 0519   CHOLHDL 4.7 04/10/2018 0519   VLDL 14 04/10/2018 0519   LDLCALC 118 (H) 04/10/2018 0519   HgbA1c:  Lab Results  Component Value Date   HGBA1C 5.5 04/11/2018   Urine Drug Screen:     Component Value Date/Time   LABOPIA NONE DETECTED 04/09/2018 1955   COCAINSCRNUR NONE DETECTED 04/09/2018 1955   LABBENZ NONE DETECTED 04/09/2018 1955   AMPHETMU NONE DETECTED 04/09/2018 1955   THCU NONE DETECTED 04/09/2018 1955   LABBARB NONE DETECTED 04/09/2018 1955    Alcohol Level No results found for: ETH  IMAGING  Vas US Doppler Pre Cabg  Result Date: 04/11/2018 PREOPERATIVE VASCULAR EVALUATION  Indications:      Pre CABG evaluation. Limitations:      Tachycardiac Comparison Study: No comparison study available Performing Technologist: Rudell Cobb RDMS, RVT  Examination Guidelines: A complete evaluation includes B-mode imaging, spectral Doppler, color Doppler, and power Doppler as needed of  all accessible portions of each vessel. Bilateral testing is considered an integral part of a complete examination. Limited examinations for reoccurring indications may be performed as noted.  Right Carotid Findings: +----------+--------+--------+--------+--------+--------+           PSV cm/sEDV cm/sStenosisDescribeComments +----------+--------+--------+--------+--------+--------+ CCA Prox  130     25                               +----------+--------+--------+--------+--------+--------+ CCA Distal99      28                               +----------+--------+--------+--------+--------+--------+ ICA Prox  45      14      1-39%                    +----------+--------+--------+--------+--------+--------+ ICA Distal67      22                               +----------+--------+--------+--------+--------+--------+ ECA       122  16                               +----------+--------+--------+--------+--------+--------+ Portions of this table do not appear on this page. +----------+--------+-------+--------+------------+           PSV cm/sEDV cmsDescribeArm Pressure +----------+--------+-------+--------+------------+ Subclavian105                                 +----------+--------+-------+--------+------------+ +---------+--------+--+--------+--+---------+ VertebralPSV cm/s58EDV cm/s18Antegrade +---------+--------+--+--------+--+---------+ Left Carotid Findings: +----------+--------+--------+--------+--------+--------+           PSV cm/sEDV cm/sStenosisDescribeComments +----------+--------+--------+--------+--------+--------+ CCA Prox  95      16                               +----------+--------+--------+--------+--------+--------+ CCA Distal86      23                               +----------+--------+--------+--------+--------+--------+ ICA Prox  70      26      1-39%                     +----------+--------+--------+--------+--------+--------+ ICA Mid   73      26                               +----------+--------+--------+--------+--------+--------+ ICA Distal73      27                               +----------+--------+--------+--------+--------+--------+ ECA       125     27                               +----------+--------+--------+--------+--------+--------+ +----------+--------+--------+--------+------------+ SubclavianPSV cm/sEDV cm/sDescribeArm Pressure +----------+--------+--------+--------+------------+           105                     83           +----------+--------+--------+--------+------------+ +---------+--------+--+--------+--+---------+ VertebralPSV cm/s50EDV cm/s16Antegrade +---------+--------+--+--------+--+---------+  ABI Findings: +--------+------------------+-----+---------+-----------------------+ Right   Rt Pressure (mmHg)IndexWaveform Comment                 +--------+------------------+-----+---------+-----------------------+ Brachial                       triphasicCatheterization on site +--------+------------------+-----+---------+-----------------------+ PTA     205               2.47 triphasic                        +--------+------------------+-----+---------+-----------------------+ DP      199               2.40 triphasic                        +--------+------------------+-----+---------+-----------------------+ +--------+------------------+-----+---------+-------+ Left    Lt Pressure (mmHg)IndexWaveform Comment +--------+------------------+-----+---------+-------+ [id number]                     triphasic        +--------+------------------+-----+---------+-------+ PTA  192               2.31 triphasic        +--------+------------------+-----+---------+-------+ DP      182               2.19 triphasic        +--------+------------------+-----+---------+-------+  +-------+---------------+----------------+ ABI/TBIToday's ABI/TBIPrevious ABI/TBI +-------+---------------+----------------+ Right  2.47                            +-------+---------------+----------------+ Left   2.31                            +-------+---------------+----------------+  Right Doppler Findings: +--------+--------+-----+---------+-----------------------+ Site    PressureIndexDoppler  Comments                +--------+--------+-----+---------+-----------------------+ Brachial             triphasicCatheterization on site +--------+--------+-----+---------+-----------------------+ Radial               triphasic                        +--------+--------+-----+---------+-----------------------+ Ulnar                biphasic                         +--------+--------+-----+---------+-----------------------+  Left Doppler Findings: +--------+--------+-----+---------+--------+ Site    PressureIndexDoppler  Comments +--------+--------+-----+---------+--------+ [id number]           triphasic         +--------+--------+-----+---------+--------+ Radial               triphasic         +--------+--------+-----+---------+--------+ Ulnar                triphasic         +--------+--------+-----+---------+--------+  Summary: Right Carotid: Velocities in the right ICA are consistent with a 1-39% stenosis. Left Carotid: Velocities in the left ICA are consistent with a 1-39% stenosis. Vertebrals: Bilateral vertebral arteries demonstrate antegrade flow. Right ABI: Resting right ankle-brachial index indicates noncompressible right lower extremity arteries. Left ABI: Resting left ankle-brachial index indicates noncompressible left lower extremity arteries. Right Upper Extremity: Doppler waveforms remain within normal limits with right radial compression. Doppler waveform obliterate with right ulnar compression. Left Upper Extremity: Doppler waveforms remain  within normal limits with left radial compression. Doppler waveform obliterate with left ulnar compression.     Preliminary     TEE Left atrial mass typical for myxoma, pedunculated, mobile, heterogenous, attached to the fossa ovalis, measuring 1.9 cm x 1.3 cm. Otherwise, normal TEE.   PHYSICAL EXAM Constitutional: Appears well-developed and well-nourished.  Psych: Affect appropriate to situation Eyes: No scleral injection HENT: No OP obstrucion Head: Normocephalic.  Cardiovascular: Normal rate and regular rhythm.  Respiratory: Effort normal, non-labored breathing Skin: WDI  Neuro: Mental Status: awake alert, follows all simple commands.  However still has expressive aphasia with hesitating speech, word finding difficulties and paraphasic errors.  Naming 2 out of 3, able to repeat simple sentences but not complicated sentences.  Cranial Nerves: II: Visual Fields are full.  III,IV, VI: EOMI without ptosis or diploplia. Pupils are equal, round, and reactive to light.  He did show saccadic eye movements V: Facial sensation is symmetric to temperature VII: Right facial droop, right palpebral fissure increased than the left.  VIII: hearing is intact to voice X: Uvula elevates symmetrically XI: Shoulder shrug is symmetric. XII: tongue is midline without atrophy or fasciculations.  Motor: Tone is normal. Bulk is normal. 5/5 strength was present on left upper and lower extremities.  Right upper extremity is 5/5 and right lower extremity was 5/5. He orbits his right arm over his left. Sensory: Sensation is symmetric to light touch and temperature in the arms and legs. Plantars: Toes are downgoing bilaterally.  Cerebellar: FNF intact bilaterally   ASSESSMENT/PLAN Mr. Lennart Gladish is a 63 y.o. male with history of HTN, HLD  presenting to [facility] from urgent care with difficulty speaking and expressive aphasia. Transferred to [facility] when myxoma found on echo.  Stroke:    Multiple bilateral embolic infarcts secondary to newly identified myxoma  CT head Evolving hypodensity involving the supra ganglionic left frontal cortex, compatible with evolving acute left MCA territory infarct.  CTA head & neck no LVO  MRI  Mult B infarcts in disparate vascular territories. left frontal small infarcts involving left MCA/PCA, left frontal and parietal punctate infarcts.The dominant abnormality remains LEFT frontal opercular cortex, without visible hemorrhage  2D Echo  60-65% EF with 2.2 X 1.3 cm Echo dense mass likely Myxoma   TEE - Left atrial mass typical for myxoma   LDL 118  HgbA1c 5.6  IV heparin for VTE prophylaxis  No antithrombotic prior to admission, now on heparin IV Given small size of infarcts without hemorrhagic conversion on MRI and high risk of embolism from myxoma.  Post surgery, anticoagulation is not needed however still recommend antiplatelet therapy for stroke prevention.  Antiplatelet regimen as per cardiology.  Therapy recommendations:  pending   Disposition:  pending   Atrial Myxoma  CVTS consulting, plan resection  TEE Left atrial mass typical for myxoma  Cardiac cath unremarkable, patent coronary arteries.   Plan for surgical resection on Tuesday  Hypertension  Stable . Long-term BP goal normotensive  Hyperlipidemia  Home meds:  pravachol 80  LDL 118, goal < 70  Now on lipitor 40  Continue statin at discharge  Other Stroke Risk Factors  ETOH use, advised to drink no more than 2 drink(s) a day  Family hx stroke (maternal grandfather, other)  Hospital day # 2  Neurology will sign off. Please call with questions. Pt will follow up with stroke clinic NP at [facility] in about 4 weeks. Thanks for the consult.  Rosalin Hawking, MD PhD Stroke Neurology 04/11/2018 3:44 PM    To contact Stroke Continuity provider, please refer to [email or web address] After hours, contact General Neurology

## 2018-04-11 NOTE — Evaluation (Signed)
Occupational Therapy Evaluation Patient Details Name: Shawn Mccann MRN: [id number] DOB: 12/29/1955 Today's Date: 04/11/2018    History of Present Illness Pt is a 63 y/o male admitted secondary to expressive aphasia. 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 Pt is undergoing pre-op work up for CABG for resection of myxoma.  PMH including but not limited to hypertension, hyperlipidemia    Clinical Impression   Pt admitted with above. He demonstrates the below listed deficits and will benefit from continued OT to maximize safety and independence with BADLs.  Pt presents to OT with increased pain Lt ankle (he reports gout), impaired communication, generalized weakness, and decreased activity tolerance. He was very limited today by Lt LE pain, Rt wrist (post cath) precautions, and elevated HR with all mobility.  He currently requires min - max A for ADLs. PTA, he lived a lone, was fully independent, and drove a forklift PTA.   Anticipate he will require CIR.       Follow Up Recommendations  CIR    Equipment Recommendations  3 in 1 bedside commode    Recommendations for Other Services Rehab consult     Precautions / Restrictions Precautions Precautions: Fall Precaution Comments: Pt with radial precautions post CABG       Mobility Bed Mobility Overal bed mobility: Needs Assistance Bed Mobility: Supine to Sit;Sit to Supine     Supine to sit: Min guard Sit to supine: Min guard   General bed mobility comments: increased time and effort.  Max cues to avoid pushing through Rt UE  HR to 134 with supine to sit   Transfers Overall transfer level: Needs assistance Equipment used: 1 person hand held assist Transfers: Lateral/Scoot Transfers Sit to Stand: Mod assist        Lateral/Scoot Transfers: Total assist General transfer comment: Pt unable to tolerate WBing through Lt ankle, and unable to use Rt UE to provide support when  attempting to stand   required assist to move sit to stand, but immediately lost balance and returned to sitting.  PT arrived.  Attempted scoot transfer to the Rt.  He repeatedly attempted to use Rt UE to push, and required physical assist to maintain precautions and not use Rt UE.  HR increased to 148 with attempts to scoot toward chair and unable to safely complete transfer     Balance Overall balance assessment: Needs assistance Sitting-balance support: Feet supported Sitting balance-Leahy Scale: Good Sitting balance - Comments: able to bend to feet to doff socks                                    ADL either performed or assessed with clinical judgement   ADL Overall ADL's : Needs assistance/impaired Eating/Feeding: Modified independent;Bed level   Grooming: Wash/dry hands;Wash/dry face;Oral care;Brushing hair;Set up;Sitting   Upper Body Bathing: Moderate assistance;Sitting   Lower Body Bathing: Maximal assistance;Sit to/from stand   Upper Body Dressing : Moderate assistance;Sitting   Lower Body Dressing: Maximal assistance;Sit to/from stand Lower Body Dressing Details (indicate cue type and reason): requires assist to pull socks over feet due to inability to use Rt UE functionally due to precautions Toilet Transfer: Maximal assistance   Toileting- Clothing Manipulation and Hygiene: Total assistance;Sit to/from stand;Sitting/lateral lean         General ADL Comments: Pt limited by Lt ankle pain when attempting to stand and by  Rt wrist precautions      Vision Baseline Vision/History: No visual deficits Additional Comments: Pt unable to follow commands needed for formal testing      Perception Perception Perception Tested?: Yes Comments: for basic tasks    Praxis Praxis Praxis tested?: Within functional limits    Pertinent Vitals/Pain Pain Assessment: Faces Faces Pain Scale: Hurts whole lot Pain Location: Lt ankle - pt reports gout  Pain Descriptors /  Indicators: Grimacing;Guarding Pain Intervention(s): Repositioned;Monitored during session;Limited activity within patient's tolerance     Hand Dominance Right   Extremity/Trunk Assessment Upper Extremity Assessment Upper Extremity Assessment: RUE deficits/detail RUE Deficits / Details: unable to fully assess Rt forearm, wrist, and hand due to cardiac cath precautions    Lower Extremity Assessment Lower Extremity Assessment: Defer to PT evaluation   Cervical / Trunk Assessment Cervical / Trunk Assessment: Normal   Communication Communication Communication: Receptive difficulties;Expressive difficulties   Cognition Arousal/Alertness: Awake/alert Behavior During Therapy: WFL for tasks assessed/performed Overall Cognitive Status: Difficult to assess                                     General Comments  BP 158/105 supine, 161/104 sitting, 170/99 End of session.  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 [employer]          OT Problem List: Decreased strength;Decreased activity tolerance;Impaired balance (sitting and/or standing);Decreased safety awareness;Decreased knowledge of use of DME or AE;Decreased knowledge of precautions;Cardiopulmonary status limiting activity;Impaired UE functional use;Pain      OT Treatment/Interventions: Self-care/ADL training;Neuromuscular  education;DME and/or AE instruction;Therapeutic activities;Cognitive remediation/compensation;Visual/perceptual remediation/compensation;Patient/family education;Balance training    OT Goals(Current goals can be found in the care plan section) Acute Rehab OT Goals Patient Stated Goal: Pt unable to state  OT Goal Formulation: With patient Time For Goal Achievement: 04/25/18 Potential to Achieve Goals: Good ADL Goals Pt Will Perform Grooming: with min guard assist;standing Pt Will Perform Upper Body Bathing: with supervision;sitting Pt Will Perform Lower Body Bathing: with min guard assist;sit to/from stand Pt Will Perform Upper Body Dressing: with supervision;sitting Pt Will Perform Lower Body Dressing: with min guard assist;with adaptive equipment;sit to/from stand Pt Will Transfer to Toilet: with min guard assist;ambulating;regular height toilet;bedside commode;grab bars Pt Will Perform Toileting - Clothing Manipulation and hygiene: with min guard assist;sit to/from stand  OT Frequency: Min 2X/week   Barriers to D/C: Decreased caregiver support          Co-evaluation PT/OT/SLP Co-Evaluation/Treatment: Yes Reason for Co-Treatment: For patient/therapist safety;To address functional/ADL transfers          AM-PAC OT "6 Clicks" Daily Activity     Outcome Measure Help from another person eating meals?: None Help from another person taking care of personal grooming?: A Little Help from another  person toileting, which includes using toliet, bedpan, or urinal?: A Lot Help from another person bathing (including washing, rinsing, drying)?: A Lot Help from another person to put on and taking off regular upper body clothing?: A Lot Help from another person to put on and taking off regular lower body clothing?: A Lot 6 Click Score: 15   End of Session Equipment Utilized During Treatment: Gait belt Nurse Communication: Mobility status;Other (comment)(c/o gout Lt ankle and increased HR  )  Activity Tolerance: No increased pain;Treatment limited secondary to medical complications (Comment) Patient left: in bed;with call bell/phone within reach;with bed alarm set;with family/visitor present  OT Visit Diagnosis: Unsteadiness on feet (R26.81);Pain;Cognitive communication deficit (R41.841) Symptoms and signs involving cognitive functions: Cerebral infarction Pain - Right/Left: Right Pain - part of body: Ankle and joints of foot                Time: 1401-1440 OT Time Calculation (min): 39 min Charges:  OT General Charges $OT Visit: 1 Visit OT Evaluation $OT Eval Moderate Complexity: 1 Mod OT Treatments $Neuromuscular Re-education: 8-22 mins  Lucille Passy, OTR/L Acute Rehabilitation Services Pager [phone] Office [phone]   Lucille Passy M 04/11/2018, 3:49 PM

## 2018-04-11 NOTE — CV Procedure (Addendum)
   Diagnostic left heart cath and coronary angios via right radial approach, using vascular ultrasound guidance for access.  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  Unable to determine the course of the circumflex artery but these almost always posterior to the aorta and not intra-arterial.  Widely patent coronary arteries without significant obstructive disease.  No tumor blush noted.  No immediate complications.  Recommend addition of low-dose beta-blocker to help control blood pressure and heart rate.  Beta-blocker withdrawal syndrome resulting in severe hypertension and tachycardia.  Recommend resuming at least low-dose beta-blockade.

## 2018-04-11 NOTE — Progress Notes (Addendum)
Physical Therapy Treatment Patient Details Name: Shawn Mccann MRN: [id number] DOB: 1955/11/26 Today's Date: 04/11/2018    History of Present Illness Pt is a 63 y/o male admitted secondary to expressive aphasia. 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 Pt is undergoing pre-op work up for CABG for resection of myxoma.  PMH including but not limited to hypertension, hyperlipidemia     PT Comments    Pt seen for mobility progression; however, pt s/p cath this AM and unable to weight bear through R UE. Additionally pt with worsening pain in L ankle secondary to "gout" per pt. Pt also limited this session secondary to increasing HR with minimal activity (121 bpm at rest, up to 146 with scooting in bed). Per RN, plan is for cardiac surgery on Tuesday (2/4) per RN. Pt would continue to benefit from skilled physical therapy services at this time while admitted and after d/c to address the below listed limitations in order to improve overall safety and independence with functional mobility.  BP at beginning of session: 161/106 mmHg BP at end of session: 170/99 mmHg    Follow Up Recommendations  CIR     Equipment Recommendations  None recommended by PT    Recommendations for Other Services       Precautions / Restrictions Precautions Precautions: Fall Precaution Comments: Pt with radial precautions post CABG     Mobility  Bed Mobility Overal bed mobility: Needs Assistance Bed Mobility: Supine to Sit;Sit to Supine     Supine to sit: Min guard Sit to supine: Min guard   General bed mobility comments: increased time and effort.  Max cues to avoid pushing through Rt UE  HR to 134 with supine to sit   Transfers Overall transfer level: Needs assistance Equipment used: 1 person hand held assist Transfers: Lateral/Scoot Transfers Sit to Stand: Mod assist        Lateral/Scoot Transfers: Total assist;+2 physical assistance;+2  safety/equipment General transfer comment: Attempted scoot transfer to the Rt.  He repeatedly attempted to use Rt UE to push, and required physical assist to maintain precautions and not use Rt UE.  HR increased to 148 with attempts to scoot toward chair and unable to safely complete transfer   Ambulation/Gait                 Stairs             Wheelchair Mobility    Modified Rankin (Stroke Patients Only) Modified Rankin (Stroke Patients Only) Pre-Morbid Rankin Score: No symptoms Modified Rankin: Moderately severe disability     Balance Overall balance assessment: Needs assistance Sitting-balance support: Feet supported Sitting balance-Leahy Scale: Good Sitting balance - Comments: able to bend to feet to doff socks                                     Cognition Arousal/Alertness: Awake/alert Behavior During Therapy: Impulsive Overall Cognitive Status: Difficult to assess                                        Exercises      General Comments General comments (skin integrity, edema, etc.): BP 158/105 supine, 161/104 sitting, 170/99 End of session.  HR to 146 with attempts to scoot to chair  Pertinent Vitals/Pain Pain Assessment: Faces Faces Pain Scale: Hurts whole lot Pain Location: Lt ankle - pt reports gout  Pain Descriptors / Indicators: Grimacing;Guarding Pain Intervention(s): Monitored during session;Repositioned    Home Living Family/patient expects to be discharged to:: Private residence Living Arrangements: Alone Available Help at Discharge: Family;Available PRN/intermittently Type of Home: House Home Access: Stairs to enter   Home Layout: One level Home Equipment: None Additional Comments: Pt reports sisters live close by     Prior Function Level of Independence: Independent      Comments: 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 [employer]     PT Goals (current goals can now be found in the care plan  section) Acute Rehab PT Goals Patient Stated Goal: Pt unable to state  PT Goal Formulation: Patient unable to participate in goal setting Time For Goal Achievement: 04/24/18 Potential to Achieve Goals: Good Progress towards PT goals: Progressing toward goals    Frequency    Min 4X/week      PT Plan Current plan remains appropriate    Co-evaluation PT/OT/SLP Co-Evaluation/Treatment: Yes Reason for Co-Treatment: For patient/therapist safety;To address functional/ADL transfers PT goals addressed during session: Mobility/safety with mobility;Balance;Strengthening/ROM        AM-PAC PT "6 Clicks" Mobility   Outcome Measure  Help needed turning from your back to your side while in a flat bed without using bedrails?: A Little Help needed moving from lying on your back to sitting on the side of a flat bed without using bedrails?: A Little Help needed moving to and from a bed to a chair (including a wheelchair)?: A Lot Help needed standing up from a chair using your arms (e.g., wheelchair or bedside chair)?: A Lot Help needed to walk in hospital room?: Total Help needed climbing 3-5 steps with a railing? : Total 6 Click Score: 12    End of Session Equipment Utilized During Treatment: Gait belt Activity Tolerance: Patient limited by fatigue;Patient limited by pain Patient left: in bed;with call bell/phone within reach;with bed alarm set Nurse Communication: Mobility status PT Visit Diagnosis: Other abnormalities of gait and mobility (R26.89);Other symptoms and signs involving the nervous system (R29.898)     Time: 1416-1440 PT Time Calculation (min) (ACUTE ONLY): 24 min  Charges:  $Therapeutic Activity: 8-22 mins                     Sherie Don, [place], DPT  Acute Rehabilitation Services Pager [phone] [place] 04/11/2018, 4:07 PM

## 2018-04-11 NOTE — Progress Notes (Addendum)
PROGRESS NOTE  Shawn Mccann FWY:[id number] DOB: 02-Feb-1956 DOA: 04/09/2018 PCP: Ocie Doyne., MD  HPI/Recap of past 24 hours: Shawn Mccann is a 62 y.o. male with medical history significant of HTN presenting with dysarthria; he was found to have multiple infracts on evaluation at [facility] and was transferred to [facility].  Yesterday, he came home from work after noticing feeling funny; it started about 330.  He drove to his sister's house and he was unable to speak at all.  Went to an Urgent Care initially and they sent him to [facility] ER where CT head showed multiple acute infarcts.  They did MRI and Echo and saw 2.2 x 1.3 mm left atrial mass suspecting atrial myxoma. Cardiology and neurology following.  04/10/2018: Patient seen and examined at his bedside.  Has no new complaints.  Motor, sensory and speech appear intact.  04/11/18: Seen and examined at bedside.  No acute events overnight.  He has partial expressive aphasia.  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  Reports left ankle acute gout flare, mildly edematous and particularly tender with touch. Last flare was about 1 year ago, feels the same. 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   -Plan CT surgery for resection of left atrial myxoma on Tuesday, 04/15/2018 -PT OT ST -Antiplatelet regimen as per cardiology  Left atrial myxoma -Cardiology and CTS following -Possible CT surgery for resection of left atrial myxoma on Tuesday,  04/15/2018 -Continue heparin drip as recommended by neurology; post surgery, anticoagulation is not needed  HTN -Toprol-XL has been resumed at 25 mg daily -Persistent hypertension and tachycardia may consider increased dose -On Toprol-XL 200 mg daily at home -Long-term blood pressure: To normotensive  Acute left ankle gout flare Obtain uric acid level Start colchicine   HLD -Continue Lipitor 40 mg daily LDL 118 with goal less than 70  Alcohol use disorder Alcohol use cessation counseling done at bedside    DVT prophylaxis:Heparin  drip  Code Status:Full - Family Communication:None at bedside Disposition Plan:Home possibly in 2-3 days or when CTS signs off. Consults called:CT surgery; Cardiology; Neurology; PT/OT/ST/Nutrition   Objective: Vitals:   04/11/18 1326 04/11/18 1438 04/11/18 1519 04/11/18 1613  BP: (!) 151/96 (!) 170/99 (!) 160/94 (!) 160/95  Pulse: (!) 103   (!) 109  Resp: 20 (!) 27 (!) 24 20  Temp: 98.4 F (36.9 C)   99 F (37.2 C)  TempSrc: Oral   Oral  SpO2: 94%   96%  Weight:      Height:        Intake/Output Summary (Last 24 hours) at 04/11/2018 1733 Last data filed at 04/11/2018 1050 Gross per 24 hour  Intake -  Output 150 ml  Net -150 ml   Filed Weights   04/09/18 1547  Weight: 85.7 kg    Exam:  . General: 63 y.o. year-old male well developed well-nourished in no acute distress.  Alert and oriented x3. . Cardiovascular: Regular rate and rhythm with no rubs or gallops.  No JVD or thyromegaly noted..   . Respiratory: Clear to Auscultation with No Wheezes or Rales.  Good Inspiratory Effort. . Abdomen: Soft nontender nondistended with normal bowel sounds x4 quadrants. . Musculoskeletal: Trace lower extremity edema. Ankle mildly  Edematous and tender on palpation. 2/4 pulses in all 4 extremities. . Skin: No ulcerative lesions noted or rashes, . Psychiatry: Mood is appropriate for condition and setting   Data  Reviewed: CBC: Recent Labs  Lab 04/10/18 0519 04/11/18 0643  WBC 7.1 12.2*  HGB 13.8 14.6  HCT 42.4 43.0  MCV 88.1 87.4  PLT 233 323   Basic Metabolic Panel: Recent Labs  Lab 04/10/18 1237 04/11/18 0643  NA 138 139  K 3.9 3.5  CL 109 108  CO2 18* 21*  GLUCOSE 93 104*  BUN 13 12  CREATININE 1.02 1.06  CALCIUM 8.5* 8.5*   GFR: Estimated Creatinine Clearance: 70.9 mL/min (by C-G formula based on SCr of 1.06 mg/dL). Liver Function Tests: Recent Labs  Lab 04/11/18 0643  AST 25  ALT 19  ALKPHOS 80  BILITOT 0.6  PROT 6.8  ALBUMIN 3.4*   No results for input(s): LIPASE, AMYLASE in the last 168 hours. No results for input(s): AMMONIA in the last 168 hours. Coagulation Profile: Recent Labs  Lab 04/11/18 0643  INR 1.11   Cardiac Enzymes: No results for input(s): CKTOTAL, CKMB, CKMBINDEX, TROPONINI in the last 168 hours. BNP (last 3 results) No results for input(s): PROBNP in the last 8760 hours. HbA1C: Recent Labs    04/10/18 0519 04/11/18 0643  HGBA1C 5.6 5.5   CBG: Recent Labs  Lab 04/11/18 0630  GLUCAP 97   Lipid Profile: Recent Labs    04/10/18 0519  CHOL 168  HDL 36*  LDLCALC 118*  TRIG 72  CHOLHDL 4.7   Thyroid Function Tests: Recent Labs    04/09/18 2215  TSH 1.586   Anemia Panel: No results for input(s): VITAMINB12, FOLATE, FERRITIN, TIBC, IRON, RETICCTPCT in the last 72 hours. Urine analysis: No results found for: COLORURINE, APPEARANCEUR, LABSPEC, PHURINE, GLUCOSEU, HGBUR, BILIRUBINUR, KETONESUR, PROTEINUR, UROBILINOGEN, NITRITE, LEUKOCYTESUR Sepsis Labs: @LABRCNTIP (procalcitonin:4,lacticidven:4)  ) Recent Results (from the past 240 hour(s))  MRSA PCR Screening     Status: None   Collection Time: 04/11/18  3:02 AM  Result Value Ref Range Status   MRSA by PCR NEGATIVE NEGATIVE Final    Comment:        The GeneXpert MRSA Assay (FDA approved for NASAL specimens only), is one component of a comprehensive MRSA  colonization surveillance program. It is not intended to diagnose MRSA infection nor to guide or monitor treatment for MRSA infections. Performed at [facility], [place] [address] [place], [place] [address]       Studies: Vas US Doppler Pre Cabg  Result Date: 04/11/2018 PREOPERATIVE VASCULAR EVALUATION  Indications:      Pre CABG evaluation. Limitations:      Tachycardiac Comparison Study: No comparison study available Performing Technologist: Rudell Cobb RDMS, RVT  Examination Guidelines: A complete evaluation includes B-mode imaging, spectral Doppler, color Doppler, and power Doppler as needed of all accessible portions of each vessel. Bilateral testing is considered an integral part of a complete examination. Limited examinations for reoccurring indications may be performed as noted.  Right Carotid Findings: +----------+--------+--------+--------+--------+--------+           PSV cm/sEDV cm/sStenosisDescribeComments +----------+--------+--------+--------+--------+--------+ CCA Prox  130     25                               +----------+--------+--------+--------+--------+--------+  CCA Distal99      28                               +----------+--------+--------+--------+--------+--------+ ICA Prox  45      14      1-39%                    +----------+--------+--------+--------+--------+--------+ ICA Distal67      22                               +----------+--------+--------+--------+--------+--------+ ECA       122     16                               +----------+--------+--------+--------+--------+--------+ Portions of this table do not appear on this page. +----------+--------+-------+--------+------------+           PSV cm/sEDV cmsDescribeArm Pressure +----------+--------+-------+--------+------------+ Subclavian105                                 +----------+--------+-------+--------+------------+  +---------+--------+--+--------+--+---------+ VertebralPSV cm/s58EDV cm/s18Antegrade +---------+--------+--+--------+--+---------+ Left Carotid Findings: +----------+--------+--------+--------+--------+--------+           PSV cm/sEDV cm/sStenosisDescribeComments +----------+--------+--------+--------+--------+--------+ CCA Prox  95      16                               +----------+--------+--------+--------+--------+--------+ CCA Distal86      23                               +----------+--------+--------+--------+--------+--------+ ICA Prox  70      26      1-39%                    +----------+--------+--------+--------+--------+--------+ ICA Mid   73      26                               +----------+--------+--------+--------+--------+--------+ ICA Distal73      27                               +----------+--------+--------+--------+--------+--------+ ECA       125     27                               +----------+--------+--------+--------+--------+--------+ +----------+--------+--------+--------+------------+ SubclavianPSV cm/sEDV cm/sDescribeArm Pressure +----------+--------+--------+--------+------------+           105                     83           +----------+--------+--------+--------+------------+ +---------+--------+--+--------+--+---------+ VertebralPSV cm/s50EDV cm/s16Antegrade +---------+--------+--+--------+--+---------+  ABI Findings: +--------+------------------+-----+---------+-----------------------+ Right   Rt Pressure (mmHg)IndexWaveform Comment                 +--------+------------------+-----+---------+-----------------------+ Brachial                       triphasicCatheterization on site +--------+------------------+-----+---------+-----------------------+ PTA     205  2.47 triphasic                        +--------+------------------+-----+---------+-----------------------+ DP       199               2.40 triphasic                        +--------+------------------+-----+---------+-----------------------+ +--------+------------------+-----+---------+-------+ Left    Lt Pressure (mmHg)IndexWaveform Comment +--------+------------------+-----+---------+-------+ [id number]                     triphasic        +--------+------------------+-----+---------+-------+ PTA     192               2.31 triphasic        +--------+------------------+-----+---------+-------+ DP      182               2.19 triphasic        +--------+------------------+-----+---------+-------+ +-------+---------------+----------------+ ABI/TBIToday's ABI/TBIPrevious ABI/TBI +-------+---------------+----------------+ Right  2.47                            +-------+---------------+----------------+ Left   2.31                            +-------+---------------+----------------+  Right Doppler Findings: +--------+--------+-----+---------+-----------------------+ Site    PressureIndexDoppler  Comments                +--------+--------+-----+---------+-----------------------+ Brachial             triphasicCatheterization on site +--------+--------+-----+---------+-----------------------+ Radial               triphasic                        +--------+--------+-----+---------+-----------------------+ Ulnar                biphasic                         +--------+--------+-----+---------+-----------------------+  Left Doppler Findings: +--------+--------+-----+---------+--------+ Site    PressureIndexDoppler  Comments +--------+--------+-----+---------+--------+ [id number]           triphasic         +--------+--------+-----+---------+--------+ Radial               triphasic         +--------+--------+-----+---------+--------+ Ulnar                triphasic         +--------+--------+-----+---------+--------+  Summary: Right Carotid: Velocities  in the right ICA are consistent with a 1-39% stenosis. Left Carotid: Velocities in the left ICA are consistent with a 1-39% stenosis. Vertebrals: Bilateral vertebral arteries demonstrate antegrade flow. Right ABI: Resting right ankle-brachial index indicates noncompressible right lower extremity arteries. Left ABI: Resting left ankle-brachial index indicates noncompressible left lower extremity arteries. Right Upper Extremity: Doppler waveforms remain within normal limits with right radial compression. Doppler waveform obliterate with right ulnar compression. Left Upper Extremity: Doppler waveforms remain within normal limits with left radial compression. Doppler waveform obliterate with left ulnar compression.  Electronically signed by Curt Jews MD on 04/11/2018 at 4:52:49 PM.    Final     Scheduled Meds: . atorvastatin  40 mg Oral q1800  . metoprolol succinate  25 mg Oral Daily  . sodium chloride flush  3  mL Intravenous Q12H    Continuous Infusions: . sodium chloride 1,000 mL (04/10/18 1413)  . sodium chloride    . heparin 1,300 Units/hr (04/11/18 1444)     LOS: 2 days     Kayleen Memos, MD [facility] Pager [phone]  If 7PM-7AM, please contact night-coverage [email or web address] Password [facility] 04/11/2018, 5:33 PM

## 2018-04-11 NOTE — Plan of Care (Signed)
Continue plan of care, surgery for atrial myxoma planned for next Tuesday, 04/15/2018. Pt has expressive aphasia, difficult at this time to ensure pt capturing all of stroke information/education.

## 2018-04-12 ENCOUNTER — Inpatient Hospital Stay (HOSPITAL_COMMUNITY): Payer: BLUE CROSS/BLUE SHIELD

## 2018-04-12 LAB — CBC
HCT: 41 % (ref 39.0–52.0)
Hemoglobin: 13.4 g/dL (ref 13.0–17.0)
MCH: 28.7 pg (ref 26.0–34.0)
MCHC: 32.7 g/dL (ref 30.0–36.0)
MCV: 87.8 fL (ref 80.0–100.0)
PLATELETS: 194 10*3/uL (ref 150–400)
RBC: 4.67 MIL/uL (ref 4.22–5.81)
RDW: 12.3 % (ref 11.5–15.5)
WBC: 12.1 10*3/uL — ABNORMAL HIGH (ref 4.0–10.5)
nRBC: 0 % (ref 0.0–0.2)

## 2018-04-12 LAB — BASIC METABOLIC PANEL
Anion gap: 10 (ref 5–15)
BUN: 10 mg/dL (ref 8–23)
CO2: 21 mmol/L — ABNORMAL LOW (ref 22–32)
Calcium: 8.3 mg/dL — ABNORMAL LOW (ref 8.9–10.3)
Chloride: 108 mmol/L (ref 98–111)
Creatinine, Ser: 1 mg/dL (ref 0.61–1.24)
GFR calc Af Amer: >60 mL/min (ref >60)
Glucose, Bld: 112 mg/dL — ABNORMAL HIGH (ref 70–99)
Potassium: 3.2 mmol/L — ABNORMAL LOW (ref 3.5–5.1)
Sodium: 139 mmol/L (ref 135–145)

## 2018-04-12 LAB — HEPARIN LEVEL (UNFRACTIONATED)
HEPARIN UNFRACTIONATED: 0.39 [IU]/mL (ref 0.30–0.70)
Heparin Unfractionated: 0.4 IU/mL (ref 0.30–0.70)

## 2018-04-12 MED ORDER — POTASSIUM CHLORIDE CRYS ER 20 MEQ PO TBCR
40.0000 meq | EXTENDED_RELEASE_TABLET | Freq: Once | ORAL | Status: AC
  Administered 2018-04-12: 40 meq via ORAL
  Filled 2018-04-12: qty 2

## 2018-04-12 MED ORDER — METOPROLOL SUCCINATE ER 25 MG PO TB24
50.0000 mg | ORAL_TABLET | Freq: Every day | ORAL | Status: DC
  Administered 2018-04-12 – 2018-04-14 (×3): 50 mg via ORAL
  Filled 2018-04-12 (×3): qty 2

## 2018-04-12 MED ORDER — IOPAMIDOL (ISOVUE-370) INJECTION 76%
INTRAVENOUS | Status: AC
  Administered 2018-04-12: 100 mL
  Filled 2018-04-12: qty 100

## 2018-04-12 NOTE — Progress Notes (Signed)
PROGRESS NOTE  [name]:[id number] DOB: Aug 13, 1955 DOA: 04/09/2018 PCP: Ocie Doyne., MD  HPI/Recap of past 24 hours: Shawn Mccann is a 63 y.o. male with medical history significant of HTN presenting with dysarthria; he was found to have multiple infracts on evaluation at [facility] and was transferred to [facility].  Yesterday, he came home from work after noticing feeling funny; it started about 330.  He drove to his sister's house and he was unable to speak at all.  Went to an Urgent Care initially and they sent him to [facility] ER where CT head showed multiple acute infarcts.  They did MRI and Echo and saw 2.2 x 1.3 mm left atrial mass suspecting atrial myxoma. Cardiology and neurology following.  04/10/2018: Patient seen and examined at his bedside.  Has no new complaints.  Motor, sensory and speech appear intact.  04/11/18: Seen and examined at bedside.  No acute events overnight.  He has partial expressive aphasia.  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  Reports left ankle acute gout flare, mildly edematous and particularly tender with touch. Last flare was about 1 year ago, feels the same. Will get uric acid level and start colchicine.  04/12/18: States left ankle gout flare is improving.  No other complaints.  Plan for left atrial myxoma resection on 04/15/2018 by CTS.  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   -Plan CT surgery for resection of left atrial myxoma on Tuesday, 04/15/2018 -PT OT ST -Antiplatelet regimen as per  cardiology  Left atrial myxoma -Cardiology and CTS following -Possible CT surgery for resection of left atrial myxoma on Tuesday, 04/15/2018 -Continue heparin drip as recommended by neurology; post surgery, anticoagulation is not needed  Uncontrolled HTN -Toprol-XL has been increased to 50 mg daily due to persistent uncontrolled hypertension and tachycardia -On Toprol-XL 200 mg daily at home -Long-term blood pressure: To normotensive  Acute left ankle gout flare Uric acid level not elevated C/w colchicine 0.6 mg BID  HLD -Continue Lipitor 40 mg daily LDL 118 with goal less than 70  Alcohol use disorder Alcohol use cessation counseling done at bedside    DVT prophylaxis:Heparin  drip  Code Status:Full - Family Communication:None at bedside Disposition Plan:Home possibly in 2-3 days or when CTS signs off. Consults called:CT surgery; Cardiology; Neurology; PT/OT/ST/Nutrition   Objective: Vitals:   04/12/18 0900 04/12/18 1028 04/12/18 1115 04/12/18 1519  BP:  (!) 184/115 (!) 169/93 (!) 156/90  Pulse:  (!) 115 99 98  Resp:  18 (!) 21 20  Temp:   99.5 F (37.5 C) 99.3 F (37.4 C)  TempSrc:   Oral Oral  SpO2:   96% 96%  Weight: 85.7 kg     Height: 5\' 3"  (1.6 m)       Intake/Output Summary (Last 24 hours) at 04/12/2018 1534 Last data filed at 04/12/2018 1400 Gross per 24 hour  Intake 843.73 ml  Output 800 ml  Net 43.73 ml   Filed Weights   04/09/18 1547 04/12/18 0900  Weight: 85.7 kg 85.7 kg  Exam:  . General: 63 y.o. year-old male well-developed well-nourished no acute distress.  Alert oriented x3.  Cardiovascular: Regular rate and rhythm with no no rubs or gallops.  No JVD or thyromegaly noted...   . Respiratory: Clear to auscultation with no wheezes or rales.  Good inspiratory effort. . Abdomen: Soft nontender nondistended with normal bowel sounds x4 quadrants. . Musculoskeletal: Trace lower extremity edema. Ankle mildly  Edematous and tender on  palpation.  Tenderness improving from 7 out of 10 yesterday to 5 out of 10 today 2/4 pulses in all 4 extremities. Marland Kitchen Psychiatry: Mood is appropriate for condition and setting   Data Reviewed: CBC: Recent Labs  Lab 04/10/18 0519 04/11/18 0643 04/12/18 0607  WBC 7.1 12.2* 12.1*  HGB 13.8 14.6 13.4  HCT 42.4 43.0 41.0  MCV 88.1 87.4 87.8  PLT 233 228 301   Basic Metabolic Panel: Recent Labs  Lab 04/10/18 1237 04/11/18 0643 04/12/18 0607  NA 138 139 139  K 3.9 3.5 3.2*  CL 109 108 108  CO2 18* 21* 21*  GLUCOSE 93 104* 112*  BUN 13 12 10   CREATININE 1.02 1.06 1.00  CALCIUM 8.5* 8.5* 8.3*   GFR: Estimated Creatinine Clearance: 72.2 mL/min (by C-G formula based on SCr of 1 mg/dL). Liver Function Tests: Recent Labs  Lab 04/11/18 0643  AST 25  ALT 19  ALKPHOS 80  BILITOT 0.6  PROT 6.8  ALBUMIN 3.4*   No results for input(s): LIPASE, AMYLASE in the last 168 hours. No results for input(s): AMMONIA in the last 168 hours. Coagulation Profile: Recent Labs  Lab 04/11/18 0643  INR 1.11   Cardiac Enzymes: No results for input(s): CKTOTAL, CKMB, CKMBINDEX, TROPONINI in the last 168 hours. BNP (last 3 results) No results for input(s): PROBNP in the last 8760 hours. HbA1C: Recent Labs    04/10/18 0519 04/11/18 0643  HGBA1C 5.6 5.5   CBG: Recent Labs  Lab 04/11/18 0630  GLUCAP 97   Lipid Profile: Recent Labs    04/10/18 0519  CHOL 168  HDL 36*  LDLCALC 118*  TRIG 72  CHOLHDL 4.7   Thyroid Function Tests: Recent Labs    04/09/18 2215  TSH 1.586   Anemia Panel: No results for input(s): VITAMINB12, FOLATE, FERRITIN, TIBC, IRON, RETICCTPCT in the last 72 hours. Urine analysis: No results found for: COLORURINE, APPEARANCEUR, LABSPEC, PHURINE, GLUCOSEU, HGBUR, BILIRUBINUR, KETONESUR, PROTEINUR, UROBILINOGEN, NITRITE, LEUKOCYTESUR Sepsis Labs: @LABRCNTIP (procalcitonin:4,lacticidven:4)  ) Recent Results (from the past 240 hour(s))  MRSA PCR Screening      Status: None   Collection Time: 04/11/18  3:02 AM  Result Value Ref Range Status   MRSA by PCR NEGATIVE NEGATIVE Final    Comment:        The GeneXpert MRSA Assay (FDA approved for NASAL specimens only), is one component of a comprehensive MRSA colonization surveillance program. It is not intended to diagnose MRSA infection nor to guide or monitor treatment for MRSA infections. Performed at [facility], [place] [address], [place], [place] [address]       Studies: Dg Chest 2 View  Result Date: 04/11/2018 CLINICAL DATA:  Chest pain today EXAM: CHEST - 2 VIEW COMPARISON:  None. FINDINGS: Shallow inspiration with elevation of left hemidiaphragm. Heart size and pulmonary vascularity are normal for technique. Probable linear atelectasis in the lung bases. No airspace disease or consolidation. No blunting of costophrenic angles. No pneumothorax. Mediastinal contours appear intact. IMPRESSION: Shallow inspiration with atelectasis in the lung bases. Electronically  Signed   By: Lucienne Capers M.D.   On: 04/11/2018 22:24   Ct Angio Chest/abd/pel For Dissection W And/or W/wo  Result Date: 04/12/2018 CLINICAL DATA:  Atrial myxoma.  Stroke. EXAM: CT ANGIOGRAPHY CHEST, ABDOMEN AND PELVIS TECHNIQUE: Multidetector CT imaging through the chest, abdomen and pelvis was performed using the standard protocol during bolus administration of intravenous contrast. Multiplanar reconstructed images and MIPs were obtained and reviewed to evaluate the vascular anatomy. CONTRAST:  129mL ISOVUE-370 IOPAMIDOL (ISOVUE-370) INJECTION 76% COMPARISON:  None. FINDINGS: CTA CHEST FINDINGS Cardiovascular: There is no evidence of aortic dissection, acute intramural hematoma, or aneurysm. Maximal diameter of the ascending aorta is 3.3 cm. Great vessels are patent. No significant coronary artery calcifications. There is a 2.0 cm mass within the left atrium abutting the anterior atrial wall consistent with the patient's known  atrial myxoma. No obvious acute pulmonary thromboembolism. Mediastinum/Nodes: There is no evidence of abnormal mediastinal adenopathy or pericardial effusion. Visualized thyroid is unremarkable. Esophagus is within normal limits. Lungs/Pleura: There are bilateral 2 mm pulmonary nodules in the right upper lobe on image 27, left upper lobe on image 39, and left lower lobe on image 56. No pneumothorax or pleural effusion. Minimal subsegmental atelectasis at the lung bases. Musculoskeletal: T6 hemangioma is noted. Irregularity of the inferior endplate is likely related to degenerative change. No vertebral compression deformity. Review of the MIP images confirms the above findings. CTA ABDOMEN AND PELVIS FINDINGS VASCULAR Aorta: Nonaneurysmal and patent. There is no evidence of dissection. Celiac: Patent.  Branch vessels are patent. SMA: SMA is patent. Renals: A single right and 3 left renal arteries are patent. IMA: Patent. Inflow: Bilateral common, internal, and external iliac arteries are patent. There is no evidence of dissection. Review of the MIP images confirms the above findings. NON-VASCULAR Hepatobiliary: 4.8 cm cyst in the left lobe of the liver has a benign appearance. Other smaller hypodensities are also likely benign cysts. Gallbladder is unremarkable. Diffuse hepatic steatosis. Pancreas: Unremarkable Spleen: Unremarkable Adrenals/Urinary Tract: Adrenal glands are within normal limits. There are tiny calculi in the upper and lower poles of both kidneys. Bladder is within normal limits. Stomach/Bowel: No obvious mass in the colon. No evidence of small-bowel obstruction. Stomach is decompressed. Prominence of the wall of the rectum and distal sigmoid colon is likely related to decompression. Lymphatic: No abnormal retroperitoneal adenopathy. Reproductive: Prostate is within normal limits. Other: No free fluid. Musculoskeletal: L5 pars defects.  Grade 1 L5-S1 spondylolisthesis. Review of the MIP images  confirms the above findings. IMPRESSION: Vascular: No evidence of aortic dissection or acute aortic vascular pathology. Nonvascular: 2.0 cm mass in the left atrium consistent with the patient's known left atrial myxoma. Small bilateral pulmonary nodules measuring 2 mm. No follow-up needed if patient is low-risk (and has no known or suspected primary neoplasm). Non-contrast chest CT can be considered in 12 months if patient is high-risk. This recommendation follows the consensus statement: Guidelines for Management of Incidental Pulmonary Nodules Detected on CT Images: From the Fleischner Society 2017; Radiology 2017; 284:228-243. Bilateral nephrolithiasis. L5-S1 grade 1 spondylolisthesis. Electronically Signed   By: Marybelle Killings M.D.   On: 04/12/2018 14:24    Scheduled Meds: . atorvastatin  40 mg Oral q1800  . colchicine  0.6 mg Oral BID  . metoprolol succinate  50 mg Oral Daily  . sodium chloride flush  3 mL Intravenous Q12H    Continuous Infusions: . sodium chloride 75 mL/hr at 04/11/18 2300  . sodium chloride    .  heparin 1,400 Units/hr (04/11/18 2319)     LOS: 3 days     Kayleen Memos, MD [facility] Pager [phone]  If 7PM-7AM, please contact night-coverage [email or web address] Password [facility] 04/12/2018, 3:34 PM

## 2018-04-12 NOTE — Progress Notes (Signed)
ANTICOAGULATION CONSULT NOTE -follow up Pharmacy Consult for Heparin Indication: CVA with atrial myxoma  No Known Allergies  Patient Measurements: Height: 5\' 3"  (160 cm) Weight: 188 lb 15 oz (85.7 kg) IBW/kg (Calculated) : 56.9 Heparin Dosing Weight: 85.7 kg  Vital Signs: Temp: 99.5 F (37.5 C) (02/01 1115) Temp Source: Oral (02/01 1115) BP: 169/93 (02/01 1115) Pulse Rate: 99 (02/01 1115)  Labs: Recent Labs    04/10/18 0519 04/10/18 1237 04/11/18 0643 04/11/18 2214 04/12/18 0607 04/12/18 1437  HGB 13.8  --  14.6  --  13.4  --   HCT 42.4  --  43.0  --  41.0  --   PLT 233  --  228  --  194  --   APTT  --   --  73*  --   --   --   LABPROT  --   --  14.2  --   --   --   INR  --   --  1.11  --   --   --   HEPARINUNFRC 0.41  --  0.28* 0.22* 0.39 0.40  CREATININE  --  1.02 1.06  --  1.00  --     Estimated Creatinine Clearance: 72.2 mL/min (by C-G formula based on SCr of 1 mg/dL).   Medical History: Past Medical History:  Diagnosis Date  . Atrial myxoma 04/08/2018  . CVA (cerebral vascular accident) ([place]) 04/08/2018  . Dyslipidemia   . Hypertension     Assessment: 63 yo male with expressive aphasia. Patient found to have CVA with atrial myxoma. Pharmacy consulted to dose heparin. No anticoagulation PTA noted.  Heparin level 0.40, therapeutic level x 2 on heparin drip 1400 units/hr. No bleeding reported. CBC wnl  TCTS notes tentatively plan minimally-invasive resection of left atrial myxoma on Tuesday 2/4.  Goal of Therapy:  Heparin level 0.3-0.5 units/mL Monitor platelets by anticoagulation protocol: Yes   Plan:  Continue heparin drip at 1400 units/hr Daily heparin level, CBC.   Nicole Cella, RPh Clinical Pharmacist Please check AMION for all [place] phone numbers After 10:00 PM, call [place] [phone] 04/12/2018,3:02 PM

## 2018-04-12 NOTE — Progress Notes (Signed)
Progress Note  Patient Name: Shawn Mccann Date of Encounter: 04/12/2018  Primary Cardiologist: Fransico Him, MD   Subjective   No new CV complaints. No new neuro events. Able to speak in slightly longer sentences, broader vocabulary. L foot gout attack.  Inpatient Medications    Scheduled Meds: . atorvastatin  40 mg Oral q1800  . colchicine  0.6 mg Oral BID  . metoprolol succinate  50 mg Oral Daily  . sodium chloride flush  3 mL Intravenous Q12H   Continuous Infusions: . sodium chloride 75 mL/hr at 04/11/18 2300  . sodium chloride    . heparin 1,400 Units/hr (04/11/18 2319)   PRN Meds: sodium chloride, acetaminophen **OR** acetaminophen (TYLENOL) oral liquid 160 mg/5 mL **OR** acetaminophen, ondansetron (ZOFRAN) IV, oxyCODONE, senna-docusate, sodium chloride flush   Vital Signs    Vitals:   04/12/18 0745 04/12/18 0900 04/12/18 1028 04/12/18 1115  BP: (!) 158/88  (!) 184/115 (!) 169/93  Pulse: 94  (!) 115 99  Resp: (!) 21  18 (!) 21  Temp: 98.1 F (36.7 C)   99.5 F (37.5 C)  TempSrc: Oral   Oral  SpO2: 95%   96%  Weight:  85.7 kg    Height:  5\' 3"  (1.6 m)      Intake/Output Summary (Last 24 hours) at 04/12/2018 1332 Last data filed at 04/12/2018 1207 Gross per 24 hour  Intake 843.73 ml  Output 500 ml  Net 343.73 ml   Last 3 Weights 04/12/2018 04/09/2018  Weight (lbs) 188 lb 15 oz 188 lb 15 oz  Weight (kg) 85.7 kg 85.7 kg      Telemetry    NSR - Personally Reviewed  ECG    No new tracing - Personally Reviewed  Physical Exam  Appears comfortable GEN: No acute distress.   Neck: No JVD Cardiac: RRR, no murmurs, rubs, or gallops.  Respiratory: Clear to auscultation bilaterally. GI: Soft, nontender, non-distended  MS: Mild L ankle swelling, tenderness. No problems at R radial cath access site. Neuro:  Nonfocal except expressive aphasia Psych: Normal affect   Labs    Chemistry Recent Labs  Lab 04/10/18 1237 04/11/18 0643 04/12/18 0607  NA 138 139  139  K 3.9 3.5 3.2*  CL 109 108 108  CO2 18* 21* 21*  GLUCOSE 93 104* 112*  BUN 13 12 10   CREATININE 1.02 1.06 1.00  CALCIUM 8.5* 8.5* 8.3*  PROT  --  6.8  --   ALBUMIN  --  3.4*  --   AST  --  25  --   ALT  --  19  --   ALKPHOS  --  80  --   BILITOT  --  0.6  --   GFRNONAA >60 >60 >60  GFRAA >60 >60 >60  ANIONGAP 11 10 10      Hematology Recent Labs  Lab 04/10/18 0519 04/11/18 0643 04/12/18 0607  WBC 7.1 12.2* 12.1*  RBC 4.81 4.92 4.67  HGB 13.8 14.6 13.4  HCT 42.4 43.0 41.0  MCV 88.1 87.4 87.8  MCH 28.7 29.7 28.7  MCHC 32.5 34.0 32.7  RDW 12.4 12.4 12.3  PLT 233 228 194    Cardiac EnzymesNo results for input(s): TROPONINI in the last 168 hours. No results for input(s): TROPIPOC in the last 168 hours.   BNPNo results for input(s): BNP, PROBNP in the last 168 hours.   DDimer No results for input(s): DDIMER in the last 168 hours.   Radiology    Dg  Chest 2 View  Result Date: 04/11/2018 CLINICAL DATA:  Chest pain today EXAM: CHEST - 2 VIEW COMPARISON:  None. FINDINGS: Shallow inspiration with elevation of left hemidiaphragm. Heart size and pulmonary vascularity are normal for technique. Probable linear atelectasis in the lung bases. No airspace disease or consolidation. No blunting of costophrenic angles. No pneumothorax. Mediastinal contours appear intact. IMPRESSION: Shallow inspiration with atelectasis in the lung bases. Electronically Signed   By: Lucienne Capers M.D.   On: 04/11/2018 22:24   Vas US Doppler Pre Cabg  Result Date: 04/11/2018 PREOPERATIVE VASCULAR EVALUATION  Indications:      Pre CABG evaluation. Limitations:      Tachycardiac Comparison Study: No comparison study available Performing Technologist: Rudell Cobb RDMS, RVT  Examination Guidelines: A complete evaluation includes B-mode imaging, spectral Doppler, color Doppler, and power Doppler as needed of all accessible portions of each vessel. Bilateral testing is considered an integral part of a  complete examination. Limited examinations for reoccurring indications may be performed as noted.  Right Carotid Findings: +----------+--------+--------+--------+--------+--------+           PSV cm/sEDV cm/sStenosisDescribeComments +----------+--------+--------+--------+--------+--------+ CCA Prox  130     25                               +----------+--------+--------+--------+--------+--------+ CCA Distal99      28                               +----------+--------+--------+--------+--------+--------+ ICA Prox  45      14      1-39%                    +----------+--------+--------+--------+--------+--------+ ICA Distal67      22                               +----------+--------+--------+--------+--------+--------+ ECA       122     16                               +----------+--------+--------+--------+--------+--------+ Portions of this table do not appear on this page. +----------+--------+-------+--------+------------+           PSV cm/sEDV cmsDescribeArm Pressure +----------+--------+-------+--------+------------+ Subclavian105                                 +----------+--------+-------+--------+------------+ +---------+--------+--+--------+--+---------+ VertebralPSV cm/s58EDV cm/s18Antegrade +---------+--------+--+--------+--+---------+ Left Carotid Findings: +----------+--------+--------+--------+--------+--------+           PSV cm/sEDV cm/sStenosisDescribeComments +----------+--------+--------+--------+--------+--------+ CCA Prox  95      16                               +----------+--------+--------+--------+--------+--------+ CCA Distal86      23                               +----------+--------+--------+--------+--------+--------+ ICA Prox  70      26      1-39%                    +----------+--------+--------+--------+--------+--------+ ICA Mid   73  26                                +----------+--------+--------+--------+--------+--------+ ICA Distal73      27                               +----------+--------+--------+--------+--------+--------+ ECA       125     27                               +----------+--------+--------+--------+--------+--------+ +----------+--------+--------+--------+------------+ SubclavianPSV cm/sEDV cm/sDescribeArm Pressure +----------+--------+--------+--------+------------+           105                     83           +----------+--------+--------+--------+------------+ +---------+--------+--+--------+--+---------+ VertebralPSV cm/s50EDV cm/s16Antegrade +---------+--------+--+--------+--+---------+  ABI Findings: +--------+------------------+-----+---------+-----------------------+ Right   Rt Pressure (mmHg)IndexWaveform Comment                 +--------+------------------+-----+---------+-----------------------+ Brachial                       triphasicCatheterization on site +--------+------------------+-----+---------+-----------------------+ PTA     205               2.47 triphasic                        +--------+------------------+-----+---------+-----------------------+ DP      199               2.40 triphasic                        +--------+------------------+-----+---------+-----------------------+ +--------+------------------+-----+---------+-------+ Left    Lt Pressure (mmHg)IndexWaveform Comment +--------+------------------+-----+---------+-------+ [id number]                     triphasic        +--------+------------------+-----+---------+-------+ PTA     192               2.31 triphasic        +--------+------------------+-----+---------+-------+ DP      182               2.19 triphasic        +--------+------------------+-----+---------+-------+ +-------+---------------+----------------+ ABI/TBIToday's ABI/TBIPrevious ABI/TBI  +-------+---------------+----------------+ Right  2.47                            +-------+---------------+----------------+ Left   2.31                            +-------+---------------+----------------+  Right Doppler Findings: +--------+--------+-----+---------+-----------------------+ Site    PressureIndexDoppler  Comments                +--------+--------+-----+---------+-----------------------+ Brachial             triphasicCatheterization on site +--------+--------+-----+---------+-----------------------+ Radial               triphasic                        +--------+--------+-----+---------+-----------------------+ Ulnar                biphasic                         +--------+--------+-----+---------+-----------------------+  Left Doppler Findings: +--------+--------+-----+---------+--------+ Site    PressureIndexDoppler  Comments +--------+--------+-----+---------+--------+ [id number]           triphasic         +--------+--------+-----+---------+--------+ Radial               triphasic         +--------+--------+-----+---------+--------+ Ulnar                triphasic         +--------+--------+-----+---------+--------+  Summary: Right Carotid: Velocities in the right ICA are consistent with a 1-39% stenosis. Left Carotid: Velocities in the left ICA are consistent with a 1-39% stenosis. Vertebrals: Bilateral vertebral arteries demonstrate antegrade flow. Right ABI: Resting right ankle-brachial index indicates noncompressible right lower extremity arteries. Left ABI: Resting left ankle-brachial index indicates noncompressible left lower extremity arteries. Right Upper Extremity: Doppler waveforms remain within normal limits with right radial compression. Doppler waveform obliterate with right ulnar compression. Left Upper Extremity: Doppler waveforms remain within normal limits with left radial compression. Doppler waveform obliterate with left  ulnar compression.  Electronically signed by Curt Jews MD on 04/11/2018 at 4:52:49 PM.    Final     Cardiac Studies   TEE 04/10/2018  - Left ventricle: The cavity size was normal. Wall thickness was   normal. Systolic function was normal. The estimated ejection   fraction was in the range of 55% to 60%. Wall motion was normal;   there were no regional wall motion abnormalities. - Left atrium: There is a medium-sized mobilemyxoma attached to the   fossa ovalis. - Right atrium: No evidence of thrombus in the atrial cavity or   appendage. - Atrial septum: No defect or patent foramen ovale was identified.  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  These usually run posterior to the aorta and not intra-arterial.  We were unable to confirm the course.  LAD contains mid 40% eccentric narrowing.  Right coronary is dominant and without obstruction.  Normal LVEDP.  No tumor blush is noted, hence possibly explaining fragmentation of the myxoma leading to multiple emboli.  Tachycardia and hypotension secondary to beta-blocker withdrawal  Patient Profile     63 y.o. male with expressive aphasia following embolic stroke from left atrial myxoma, HTN, mild nonobstructive CAD  Assessment & Plan    1. LA myxoma: surgery 04/15/2018 2. CVA/residual expressive aphasia, right side weakness seems resolved. 3. HTN: low dose metoprolol resumed yesterday for beta blocker rebound, remains tachycardic and hypertensive. Increase metoprolol dose. 4. HLP:  Statin started on this admission. 5. Minor CAD: asymptomatic, not clinically important at this time, byt target LDL<70.     For questions or updates, please contact [place] Please consult [email or web address] for contact info under        Signed, Sanda Klein, MD  04/12/2018, 1:32 PM   .

## 2018-04-12 NOTE — Progress Notes (Signed)
[address]       [place] [address]             [phone]     CARDIOTHORACIC SURGERY PROGRESS NOTE  1 Day Post-Op  S/P Procedure(s) (LRB): LEFT HEART CATH AND CORONARY ANGIOGRAPHY (N/A)  Subjective: No complaints except pain left foot c/w gout flare.  Expressive aphasia persists.  Neuro otherwise stable  Objective: Vital signs in last 24 hours: Temp:  [98.1 F (36.7 C)-99.1 F (37.3 C)] 98.1 F (36.7 C) (02/01 0745) Pulse Rate:  [88-115] 115 (02/01 1028) Cardiac Rhythm: Normal sinus rhythm (02/01 0800) Resp:  [17-27] 18 (02/01 1028) BP: (146-184)/(88-115) 184/115 (02/01 1028) SpO2:  [94 %-96 %] 95 % (02/01 0745) Weight:  [85.7 kg] 85.7 kg (02/01 0900)  Physical Exam:  Rhythm:   sinus  Breath sounds: clear  Heart sounds:  RRR  Incisions:  n/a  Abdomen:  soft  Extremities:  warm   Intake/Output from previous day: 01/31 0701 - 02/01 0700 In: -  Out: 150 [Urine:150] Intake/Output this shift: Total I/O In: 621.7 [P.O.:240; I.V.:381.7] Out: 350 [Urine:350]  Lab Results: Recent Labs    04/11/18 0643 04/12/18 0607  WBC 12.2* 12.1*  HGB 14.6 13.4  HCT 43.0 41.0  PLT 228 194   BMET:  Recent Labs    04/11/18 0643 04/12/18 0607  NA 139 139  K 3.5 3.2*  CL 108 108  CO2 21* 21*  GLUCOSE 104* 112*  BUN 12 10  CREATININE 1.06 1.00  CALCIUM 8.5* 8.3*    CBG (last 3)  Recent Labs    04/11/18 0630  GLUCAP 97   PT/INR:   Recent Labs    04/11/18 0643  LABPROT 14.2  INR 1.11    CXR:  CHEST - 2 VIEW  COMPARISON:  None.  FINDINGS: Shallow inspiration with elevation of left hemidiaphragm. Heart size and pulmonary vascularity are normal for technique. Probable linear atelectasis in the lung bases. No airspace disease or consolidation. No blunting of costophrenic angles. No pneumothorax. Mediastinal contours appear intact.  IMPRESSION: Shallow inspiration with atelectasis in the lung bases.   Electronically  Signed   By: Lucienne Capers M.D.   On: 04/11/2018 22:24   PREOPERATIVE VASCULAR EVALUATION  Indications:      Pre CABG evaluation. Limitations:      Tachycardiac Comparison Study: No comparison study available  Performing Technologist: Rudell Cobb RDMS, RVT    Examination Guidelines: A complete evaluation includes B-mode imaging, spectral Doppler, color Doppler, and power Doppler as needed of all accessible portions of each vessel. Bilateral testing is considered an integral part of a complete examination. Limited examinations for reoccurring indications may be performed as noted.    Right Carotid Findings: +----------+--------+--------+--------+--------+--------+           PSV cm/sEDV cm/sStenosisDescribeComments +----------+--------+--------+--------+--------+--------+ CCA Prox  130     25                               +----------+--------+--------+--------+--------+--------+ CCA Distal99      28                               +----------+--------+--------+--------+--------+--------+ ICA Prox  45      14      1-39%                    +----------+--------+--------+--------+--------+--------+  ICA Distal67      22                               +----------+--------+--------+--------+--------+--------+ ECA       122     16                               +----------+--------+--------+--------+--------+--------+  Portions of this table do not appear on this page.    +----------+--------+-------+--------+------------+           PSV cm/sEDV cmsDescribeArm Pressure +----------+--------+-------+--------+------------+ Subclavian105                                 +----------+--------+-------+--------+------------+  +---------+--------+--+--------+--+---------+ VertebralPSV cm/s58EDV cm/s18Antegrade +---------+--------+--+--------+--+---------+  Left Carotid  Findings: +----------+--------+--------+--------+--------+--------+           PSV cm/sEDV cm/sStenosisDescribeComments +----------+--------+--------+--------+--------+--------+ CCA Prox  95      16                               +----------+--------+--------+--------+--------+--------+ CCA Distal86      23                               +----------+--------+--------+--------+--------+--------+ ICA Prox  70      26      1-39%                    +----------+--------+--------+--------+--------+--------+ ICA Mid   73      26                               +----------+--------+--------+--------+--------+--------+ ICA Distal73      27                               +----------+--------+--------+--------+--------+--------+ ECA       125     27                               +----------+--------+--------+--------+--------+--------+   +----------+--------+--------+--------+------------+ SubclavianPSV cm/sEDV cm/sDescribeArm Pressure +----------+--------+--------+--------+------------+           105                     83           +----------+--------+--------+--------+------------+  +---------+--------+--+--------+--+---------+ VertebralPSV cm/s50EDV cm/s16Antegrade +---------+--------+--+--------+--+---------+    ABI Findings: +--------+------------------+-----+---------+-----------------------+ Right   Rt Pressure (mmHg)IndexWaveform Comment                 +--------+------------------+-----+---------+-----------------------+ Brachial                       triphasicCatheterization on site +--------+------------------+-----+---------+-----------------------+ PTA     205               2.47 triphasic                        +--------+------------------+-----+---------+-----------------------+ DP      199               2.40 triphasic                         +--------+------------------+-----+---------+-----------------------+  +--------+------------------+-----+---------+-------+  Left    Lt Pressure (mmHg)IndexWaveform Comment +--------+------------------+-----+---------+-------+ [id number]                     triphasic        +--------+------------------+-----+---------+-------+ PTA     192               2.31 triphasic        +--------+------------------+-----+---------+-------+ DP      182               2.19 triphasic        +--------+------------------+-----+---------+-------+  +-------+---------------+----------------+ ABI/TBIToday's ABI/TBIPrevious ABI/TBI +-------+---------------+----------------+ Right  2.47                            +-------+---------------+----------------+ Left   2.31                            +-------+---------------+----------------+    Right Doppler Findings: +--------+--------+-----+---------+-----------------------+ Site    PressureIndexDoppler  Comments                +--------+--------+-----+---------+-----------------------+ Brachial             triphasicCatheterization on site +--------+--------+-----+---------+-----------------------+ Radial               triphasic                        +--------+--------+-----+---------+-----------------------+ Ulnar                biphasic                         +--------+--------+-----+---------+-----------------------+     Left Doppler Findings: +--------+--------+-----+---------+--------+ Site    PressureIndexDoppler  Comments +--------+--------+-----+---------+--------+ [id number]           triphasic         +--------+--------+-----+---------+--------+ Radial               triphasic         +--------+--------+-----+---------+--------+ Ulnar                triphasic         +--------+--------+-----+---------+--------+     Summary: Right Carotid:  Velocities in the right ICA are consistent with a 1-39% stenosis.  Left Carotid: Velocities in the left ICA are consistent with a 1-39% stenosis. Vertebrals: Bilateral vertebral arteries demonstrate antegrade flow.  Right ABI: Resting right ankle-brachial index indicates noncompressible right lower extremity arteries. Left ABI: Resting left ankle-brachial index indicates noncompressible left lower extremity arteries. Right Upper Extremity: Doppler waveforms remain within normal limits with right radial compression. Doppler waveform obliterate with right ulnar compression. Left Upper Extremity: Doppler waveforms remain within normal limits with left radial compression. Doppler waveform obliterate with left ulnar compression.   Electronically signed by Curt Jews MD on 04/11/2018 at 4:52:49 PM.     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  These usually run posterior to the aorta and not intra-arterial.  We were unable to confirm the course.  LAD contains mid 40% eccentric narrowing.  Right coronary is dominant and without obstruction.  Normal LVEDP.  No tumor blush is noted, hence possibly explaining fragmentation of the myxoma leading to multiple emboli.  Tachycardia and hypotension secondary to beta-blocker withdrawal  RECOMMENDATIONS:   Resume IV  heparin in 4 hours without bolus.  Consider adding beta-blocker therapy to solve beta-blocker withdrawal.  Recommendations   Antiplatelet/Anticoag No indication for antiplatelet therapy at this time .  Surgeon Notes     04/11/2018 8:27 AM CV Procedure addendum by Belva Crome, MD    04/10/2018 2:43 PM Op Note signed by Sanda Klein, MD  Indications   Cardiac myxoma [D15.1 (ICD-10-CM)]  Cerebrovascular accident (CVA) due to embolism of middle cerebral artery, unspecified blood vessel laterality ([place]) [T01.601  (ICD-10-CM)]  Procedural Details   Technical Details The right radial area was sterilely prepped and draped. Intravenous sedation with Versed and fentanyl was administered. 1% Xylocaine was infiltrated to achieve local analgesia. Using real-time vascular ultrasound, a double wall stick with an angiocath was utilized to obtain intra-arterial access. A VUS image was saved for the record.The modified Seldinger technique was used to place a 63F " Slender" sheath in the right radial artery. Weight based heparin was administered. Coronary angiography was done using 5 F catheters. Right coronary angiography was performed with a JR4. Left ventricular hemodymic recordings were done using the JR 4 catheter. Left coronary angiography was performed with a JL 3.5 cm. Recognizing anomalous origin of the circumflex from the right sinus of Valsalva, and E Z Rad right coronary guide catheter was used to better visualize the vessel.  Hemostasis was achieved using a pneumatic band.  During this procedure the patient is administered a total of Versed 0.5 mg and Fentanyl 25 mg to achieve and maintain moderate conscious sedation. The patient's heart rate, blood pressure, and oxygen saturation are monitored continuously during the procedure. The period of conscious sedation is 20 minutes, of which I was present face-to-face 100% of this time. Estimated blood loss <50 mL.   During this procedure medications were administered to achieve and maintain moderate conscious sedation while the patient's heart rate, blood pressure, and oxygen saturation were continuously monitored and I was present face-to-face 100% of this time.  Medications  (Filter: Administrations occurring from 04/11/18 0714 to 04/11/18 0932)  Medication Rate/Dose/Volume Action  Date Time   labetalol (NORMODYNE,TRANDATE) injection (mg) 10 mg Given 04/11/18 0740   Total dose as of 04/12/18 1119        10 mg        midazolam (VERSED) injection (mg) 0.5 mg Given  04/11/18 0748   Total dose as of 04/12/18 1119        0.5 mg        fentaNYL (SUBLIMAZE) injection (mcg) 25 mcg Given 04/11/18 0749   Total dose as of 04/12/18 1119        25 mcg        lidocaine (PF) (XYLOCAINE) 1 % injection (mL) 2 mL Given 04/11/18 0747   Total dose as of 04/12/18 1119        2 mL        Radial Cocktail/Verapamil only (mL)  Given 04/11/18 0754   Total dose as of 04/12/18 1119        Cannot be calculated        heparin injection (Units) 3,000 Units Given 04/11/18 0756   Total dose as of 04/12/18 1119        3,000 Units        iohexol (OMNIPAQUE) 350 MG/ML injection (mL) 60 mL Given 04/11/18 0810   Total dose as of 04/12/18 1119        60 mL        Heparin (Porcine) in NaCl 1000-0.9  UT/500ML-% SOLN (mL) 500 mL Given 04/11/18 0819   Total dose as of 04/12/18 1119 500 mL Given 0819   1,000 mL        atorvastatin (LIPITOR) tablet 40 mg (mg) *Not included in total [facility] Hold 04/11/18 0720   Dosing weight:  85.7 kg        Total dose as of 04/12/18 1119        Cannot be calculated        senna-docusate (Senokot-S) tablet 1 tablet (tablet) *Not included in total MAR Hold 04/11/18 0720   Total dose as of 04/12/18 1119        Cannot be calculated        Sedation Time   Sedation Time Physician-1: 20 minutes  Coronary Findings   Diagnostic  Dominance: Right  Left Anterior Descending  Prox LAD lesion 40% stenosed  Prox LAD lesion is 40% stenosed.  Left Circumflex  Vessel is small.  Ost Cx to Mid Cx lesion 15% stenosed  Ost Cx to Mid Cx lesion is 15% stenosed.  First Obtuse Marginal Branch  Vessel is small in size.  Right Coronary Artery  Mid RCA to Dist RCA lesion 15% stenosed  Mid RCA to Dist RCA lesion is 15% stenosed.  Intervention   No interventions have been documented.  Left Heart   Left Ventricle No tumor blush was noted during coronary angiography.  Coronary Diagrams   Diagnostic  Dominance: Right      Assessment/Plan: S/P Procedure(s)  (LRB): LEFT HEART CATH AND CORONARY ANGIOGRAPHY (N/A)  I have personally reviewed the patient's diagnostic cardiac cath films with results as noted above  We tentatively plan minimally-invasive resection of left atrial myxoma on Tuesday 2/4  Will get CTA to evaluate the feasibility of peripheral arterial cannulation for surgery   I spent in excess of 15 minutes during the conduct of this hospital encounter and >50% of this time involved direct face-to-face encounter with the patient for counseling and/or coordination of their care.          Rexene Alberts, MD 04/12/2018 11:18 AM

## 2018-04-13 LAB — HEPARIN LEVEL (UNFRACTIONATED): HEPARIN UNFRACTIONATED: 0.44 [IU]/mL (ref 0.30–0.70)

## 2018-04-13 LAB — CBC
HCT: 39.1 % (ref 39.0–52.0)
Hemoglobin: 12.7 g/dL — ABNORMAL LOW (ref 13.0–17.0)
MCH: 28.7 pg (ref 26.0–34.0)
MCHC: 32.5 g/dL (ref 30.0–36.0)
MCV: 88.5 fL (ref 80.0–100.0)
PLATELETS: 198 10*3/uL (ref 150–400)
RBC: 4.42 MIL/uL (ref 4.22–5.81)
RDW: 12.3 % (ref 11.5–15.5)
WBC: 9.6 10*3/uL (ref 4.0–10.5)
nRBC: 0 % (ref 0.0–0.2)

## 2018-04-13 LAB — BASIC METABOLIC PANEL
Anion gap: 9 (ref 5–15)
BUN: 13 mg/dL (ref 8–23)
CALCIUM: 8.2 mg/dL — AB (ref 8.9–10.3)
CO2: 20 mmol/L — AB (ref 22–32)
Chloride: 109 mmol/L (ref 98–111)
Creatinine, Ser: 1.09 mg/dL (ref 0.61–1.24)
GFR calc Af Amer: >60 mL/min (ref >60)
GLUCOSE: 102 mg/dL — AB (ref 70–99)
Potassium: 3.2 mmol/L — ABNORMAL LOW (ref 3.5–5.1)
Sodium: 138 mmol/L (ref 135–145)

## 2018-04-13 LAB — MAGNESIUM: Magnesium: 1.8 mg/dL (ref 1.7–2.4)

## 2018-04-13 LAB — PHOSPHORUS: Phosphorus: 2.7 mg/dL (ref 2.5–4.6)

## 2018-04-13 MED ORDER — POTASSIUM CHLORIDE CRYS ER 20 MEQ PO TBCR
40.0000 meq | EXTENDED_RELEASE_TABLET | Freq: Two times a day (BID) | ORAL | Status: AC
  Administered 2018-04-13 (×2): 40 meq via ORAL
  Filled 2018-04-13 (×2): qty 2

## 2018-04-13 MED ORDER — MAGNESIUM SULFATE 2 GM/50ML IV SOLN
2.0000 g | Freq: Once | INTRAVENOUS | Status: AC
  Administered 2018-04-13: 2 g via INTRAVENOUS
  Filled 2018-04-13: qty 50

## 2018-04-13 NOTE — Progress Notes (Addendum)
PROGRESS NOTE  Shawn Mccann DTO:[id number] DOB: [birth year] DOA: 04/09/2018 PCP: Ocie Doyne., MD  HPI/Recap of past 24 hours: Shawn Mccann is a 63 y.o. male with past medical history significant of HTN presenting with dysarthria; he was found to have multiple infracts on evaluation at [facility] and was transferred to [facility].  Day prior to presentation, came home from work after feeling funny; it started about 330.  He drove to his sister's house and he was unable to speak at all.  Went to an Urgent Care initially and they sent him to [facility] ER where CT head showed multiple acute infarcts.  MRI brain completed. Neurology consulted and followed. 2D Echo revealed 2.2 x 1.3 mm left atrial mass suspicious for atrial myxoma. Cardiology consulted and followed. CTS also consulted and following. Plan for resection of L atrial myxoma on 04/15/18. On IV heparin until his surgery on Tuesday.  Heart cath done on 04/11/18 as preparation for CT surgery revealed normal coronary arteries with a small anomalous LCx of the right coronary cusp.    Reports left ankle acute gout flare, mildly edematous and particularly tender with touch. Last flare was about 1 year ago, feels the same. Started on colchicine with improvement of symptoms.  04/13/2018: Patient seen and examined at his bedside.  No acute events overnight.  Pain on his left ankle is improved.  Also reports some stiffness in his left knee.  Assessment/Plan: Principal Problem:   Atrial myxoma Active Problems:   CVA (cerebral vascular accident) ([place])   Hypertension   Dyslipidemia  Acute cardioembolic CVAs -Patient with acute left MCA CVA on CT -MRI with multiple acute infarcts secondary to shower of emboli -Source of emboli is almost certainly atrial myxoma -Cardiac cath revealed normal coronary arteries with a small anomalous LCx of the right coronary cusp.   -Plan CT surgery for resection of left atrial myxoma on Tuesday, 04/15/2018 -PT OT  ST -Antiplatelet regimen as per cardiology  Left atrial myxoma -Cardiology and CTS following -Possible CT surgery for resection of left atrial myxoma on Tuesday, 04/15/2018 -Continue heparin drip as recommended by neurology; post surgery, anticoagulation is not needed  Improving uncontrolled HTN -Toprol-XL increased to 50 mg daily on 04/12/2018 due to uncontrolled hypertension and tachycardia which are improving -On Toprol-XL 200 mg daily at home -Long-term blood pressure: To normotensive  Hypokalemia Potassium 3.2 Repleted with KCl p.o. 40 mg x 2 doses  Hypomagnesemia Repleted with 2 g of IV magnesium  Acute left ankle gout flare Uric acid level- not elevated C/w colchicine 0.6 mg BID  HLD -Continue Lipitor 40 mg daily LDL 118 with goal less than 70  Alcohol use disorder Alcohol use cessation counseling done at bedside  Diffuse hepatic steatosis Seen on CT a chest/abdomen/pelvis done on 04/12/2018  L5-S1 grade 1 spondylolisthesis Noted on CT  Small bilateral pulmonary nodules Noncontrast chest CT can be considered in 12 months if patient is high risk  Bilateral nephrolithiasis Asymptomatic     DVT prophylaxis:Heparin  drip  Code Status:Full - Family Communication:None at bedside Disposition Plan:Home possibly in 2-3 days or when CTS signs off. Consults called:CT surgery; Cardiology; Neurology; PT/OT/ST/Nutrition   Objective: Vitals:   04/13/18 0350 04/13/18 0700 04/13/18 0800 04/13/18 1129  BP: (!) 144/89 (!) 158/90  (!) 158/90  Pulse: 84 91  92  Resp: 18 16    Temp: 97.7 F (36.5 C) 97.6 F (36.4 C)  97.6 F (36.4 C)  TempSrc: Oral Oral  Oral  SpO2:  95%   92%  Weight:      Height:   5\' 3"  (1.6 m)     Intake/Output Summary (Last 24 hours) at 04/13/2018 1430 Last data filed at 04/13/2018 1200 Gross per 24 hour  Intake 1144 ml  Output -  Net 1144 ml   Filed Weights   04/09/18 1547 04/12/18 0900  Weight: 85.7 kg 85.7 kg     Exam:  . General: 63 y.o. year-old male well-developed well-nourished in no acute distress.  Alert and oriented x3.  Mild expressive aphasia.  .  Cardiovascular: Regular rate and rhythm with no rubs or gallops.  No JVD or thyromegaly noted . Respiratory: Clear to auscultation with no wheezes or rales.  Good inspiratory effort.   . Abdomen: Soft nontender nondistended with normal bowel sounds x4 quadrants. . Musculoskeletal: Trace lower extremity edema. Ankle mildly  Edematous with mild tenderness on palpation.   Marland Kitchen Psychiatry: Mood is appropriate for condition and setting   Data Reviewed: CBC: Recent Labs  Lab 04/10/18 0519 04/11/18 0643 04/12/18 0607 04/13/18 0535  WBC 7.1 12.2* 12.1* 9.6  HGB 13.8 14.6 13.4 12.7*  HCT 42.4 43.0 41.0 39.1  MCV 88.1 87.4 87.8 88.5  PLT 233 228 194 751   Basic Metabolic Panel: Recent Labs  Lab 04/10/18 1237 04/11/18 0643 04/12/18 0607 04/13/18 0535  NA 138 139 139 138  K 3.9 3.5 3.2* 3.2*  CL 109 108 108 109  CO2 18* 21* 21* 20*  GLUCOSE 93 104* 112* 102*  BUN 13 12 10 13   CREATININE 1.02 1.06 1.00 1.09  CALCIUM 8.5* 8.5* 8.3* 8.2*  MG  --   --   --  1.8  PHOS  --   --   --  2.7   GFR: Estimated Creatinine Clearance: 66.2 mL/min (by C-G formula based on SCr of 1.09 mg/dL). Liver Function Tests: Recent Labs  Lab 04/11/18 0643  AST 25  ALT 19  ALKPHOS 80  BILITOT 0.6  PROT 6.8  ALBUMIN 3.4*   No results for input(s): LIPASE, AMYLASE in the last 168 hours. No results for input(s): AMMONIA in the last 168 hours. Coagulation Profile: Recent Labs  Lab 04/11/18 0643  INR 1.11   Cardiac Enzymes: No results for input(s): CKTOTAL, CKMB, CKMBINDEX, TROPONINI in the last 168 hours. BNP (last 3 results) No results for input(s): PROBNP in the last 8760 hours. HbA1C: Recent Labs    04/11/18 0643  HGBA1C 5.5   CBG: Recent Labs  Lab 04/11/18 0630  GLUCAP 97   Lipid Profile: No results for input(s): CHOL, HDL,  LDLCALC, TRIG, CHOLHDL, LDLDIRECT in the last 72 hours. Thyroid Function Tests: No results for input(s): TSH, T4TOTAL, FREET4, T3FREE, THYROIDAB in the last 72 hours. Anemia Panel: No results for input(s): VITAMINB12, FOLATE, FERRITIN, TIBC, IRON, RETICCTPCT in the last 72 hours. Urine analysis: No results found for: COLORURINE, APPEARANCEUR, LABSPEC, PHURINE, GLUCOSEU, HGBUR, BILIRUBINUR, KETONESUR, PROTEINUR, UROBILINOGEN, NITRITE, LEUKOCYTESUR Sepsis Labs: @LABRCNTIP (procalcitonin:4,lacticidven:4)  ) Recent Results (from the past 240 hour(s))  MRSA PCR Screening     Status: None   Collection Time: 04/11/18  3:02 AM  Result Value Ref Range Status   MRSA by PCR NEGATIVE NEGATIVE Final    Comment:        The GeneXpert MRSA Assay (FDA approved for NASAL specimens only), is one component of a comprehensive MRSA colonization surveillance program. It is not intended to diagnose MRSA infection nor to guide or monitor treatment for MRSA infections. Performed at  [facility], [place] [address], [place], [place] [address]       Studies: No results found.  Scheduled Meds: . atorvastatin  40 mg Oral q1800  . colchicine  0.6 mg Oral BID  . metoprolol succinate  50 mg Oral Daily  . potassium chloride  40 mEq Oral BID  . sodium chloride flush  3 mL Intravenous Q12H    Continuous Infusions: . sodium chloride 75 mL/hr at 04/13/18 0733  . sodium chloride    . heparin 1,400 Units/hr (04/12/18 1843)     LOS: 4 days     Kayleen Memos, MD [facility] Pager [phone]  If 7PM-7AM, please contact night-coverage [email or web address] Password [email or web address] 04/13/2018, 2:30 PM

## 2018-04-13 NOTE — Progress Notes (Signed)
Progress Note  Patient Name: Gannon Heinzman Date of Encounter: 04/13/2018  Primary Cardiologist: Fransico Him, MD   Subjective   No new neurological or cardiovascular events.  Aphasia persists.  Left ankle hurts less.  Inpatient Medications    Scheduled Meds: . atorvastatin  40 mg Oral q1800  . colchicine  0.6 mg Oral BID  . metoprolol succinate  50 mg Oral Daily  . potassium chloride  40 mEq Oral BID  . sodium chloride flush  3 mL Intravenous Q12H   Continuous Infusions: . sodium chloride 75 mL/hr at 04/13/18 0733  . sodium chloride    . heparin 1,400 Units/hr (04/12/18 1843)   PRN Meds: sodium chloride, acetaminophen **OR** acetaminophen (TYLENOL) oral liquid 160 mg/5 mL **OR** acetaminophen, ondansetron (ZOFRAN) IV, oxyCODONE, senna-docusate, sodium chloride flush   Vital Signs    Vitals:   04/12/18 1934 04/12/18 2327 04/13/18 0350 04/13/18 0700  BP: (!) 157/92 135/84 (!) 144/89 (!) 158/90  Pulse: 87 91 84 91  Resp: 17 15 18 16   Temp: 98.9 F (37.2 C) 99 F (37.2 C) 97.7 F (36.5 C) 97.6 F (36.4 C)  TempSrc: Oral Oral Oral Oral  SpO2: 95% 95% 95%   Weight:      Height:        Intake/Output Summary (Last 24 hours) at 04/13/2018 1046 Last data filed at 04/12/2018 1843 Gross per 24 hour  Intake 1144 ml  Output 450 ml  Net 694 ml   Last 3 Weights 04/12/2018 04/09/2018  Weight (lbs) 188 lb 15 oz 188 lb 15 oz  Weight (kg) 85.7 kg 85.7 kg      Telemetry    Sinus rhythm- Personally Reviewed  ECG    No new tracing- Personally Reviewed  Physical Exam  Comfortable lying fully flat in bed GEN: No acute distress.   Neck: No JVD Cardiac: RRR, no murmurs, rubs, or gallops.  Respiratory: Clear to auscultation bilaterally. GI: Soft, nontender, non-distended  MS: No edema; No deformity. Neuro:  Nonfocal .  Expressive aphasia but seems to comprehend.  No noticeable extremity weakness on my exam. Psych: Normal affect   Labs    Chemistry Recent Labs  Lab  04/11/18 0643 04/12/18 0607 04/13/18 0535  NA 139 139 138  K 3.5 3.2* 3.2*  CL 108 108 109  CO2 21* 21* 20*  GLUCOSE 104* 112* 102*  BUN 12 10 13   CREATININE 1.06 1.00 1.09  CALCIUM 8.5* 8.3* 8.2*  PROT 6.8  --   --   ALBUMIN 3.4*  --   --   AST 25  --   --   ALT 19  --   --   ALKPHOS 80  --   --   BILITOT 0.6  --   --   GFRNONAA >60 >60 >60  GFRAA >60 >60 >60  ANIONGAP 10 10 9      Hematology Recent Labs  Lab 04/11/18 0643 04/12/18 0607 04/13/18 0535  WBC 12.2* 12.1* 9.6  RBC 4.92 4.67 4.42  HGB 14.6 13.4 12.7*  HCT 43.0 41.0 39.1  MCV 87.4 87.8 88.5  MCH 29.7 28.7 28.7  MCHC 34.0 32.7 32.5  RDW 12.4 12.3 12.3  PLT 228 194 198    Cardiac EnzymesNo results for input(s): TROPONINI in the last 168 hours. No results for input(s): TROPIPOC in the last 168 hours.   BNPNo results for input(s): BNP, PROBNP in the last 168 hours.   DDimer No results for input(s): DDIMER in the last 168  hours.   Radiology    Dg Chest 2 View  Result Date: 04/11/2018 CLINICAL DATA:  Chest pain today EXAM: CHEST - 2 VIEW COMPARISON:  None. FINDINGS: Shallow inspiration with elevation of left hemidiaphragm. Heart size and pulmonary vascularity are normal for technique. Probable linear atelectasis in the lung bases. No airspace disease or consolidation. No blunting of costophrenic angles. No pneumothorax. Mediastinal contours appear intact. IMPRESSION: Shallow inspiration with atelectasis in the lung bases. Electronically Signed   By: Lucienne Capers M.D.   On: 04/11/2018 22:24   Ct Angio Chest/abd/pel For Dissection W And/or W/wo  Result Date: 04/12/2018 CLINICAL DATA:  Atrial myxoma.  Stroke. EXAM: CT ANGIOGRAPHY CHEST, ABDOMEN AND PELVIS TECHNIQUE: Multidetector CT imaging through the chest, abdomen and pelvis was performed using the standard protocol during bolus administration of intravenous contrast. Multiplanar reconstructed images and MIPs were obtained and reviewed to evaluate the  vascular anatomy. CONTRAST:  11mL ISOVUE-370 IOPAMIDOL (ISOVUE-370) INJECTION 76% COMPARISON:  None. FINDINGS: CTA CHEST FINDINGS Cardiovascular: There is no evidence of aortic dissection, acute intramural hematoma, or aneurysm. Maximal diameter of the ascending aorta is 3.3 cm. Great vessels are patent. No significant coronary artery calcifications. There is a 2.0 cm mass within the left atrium abutting the anterior atrial wall consistent with the patient's known atrial myxoma. No obvious acute pulmonary thromboembolism. Mediastinum/Nodes: There is no evidence of abnormal mediastinal adenopathy or pericardial effusion. Visualized thyroid is unremarkable. Esophagus is within normal limits. Lungs/Pleura: There are bilateral 2 mm pulmonary nodules in the right upper lobe on image 27, left upper lobe on image 39, and left lower lobe on image 56. No pneumothorax or pleural effusion. Minimal subsegmental atelectasis at the lung bases. Musculoskeletal: T6 hemangioma is noted. Irregularity of the inferior endplate is likely related to degenerative change. No vertebral compression deformity. Review of the MIP images confirms the above findings. CTA ABDOMEN AND PELVIS FINDINGS VASCULAR Aorta: Nonaneurysmal and patent. There is no evidence of dissection. Celiac: Patent.  Branch vessels are patent. SMA: SMA is patent. Renals: A single right and 3 left renal arteries are patent. IMA: Patent. Inflow: Bilateral common, internal, and external iliac arteries are patent. There is no evidence of dissection. Review of the MIP images confirms the above findings. NON-VASCULAR Hepatobiliary: 4.8 cm cyst in the left lobe of the liver has a benign appearance. Other smaller hypodensities are also likely benign cysts. Gallbladder is unremarkable. Diffuse hepatic steatosis. Pancreas: Unremarkable Spleen: Unremarkable Adrenals/Urinary Tract: Adrenal glands are within normal limits. There are tiny calculi in the upper and lower poles of both  kidneys. Bladder is within normal limits. Stomach/Bowel: No obvious mass in the colon. No evidence of small-bowel obstruction. Stomach is decompressed. Prominence of the wall of the rectum and distal sigmoid colon is likely related to decompression. Lymphatic: No abnormal retroperitoneal adenopathy. Reproductive: Prostate is within normal limits. Other: No free fluid. Musculoskeletal: L5 pars defects.  Grade 1 L5-S1 spondylolisthesis. Review of the MIP images confirms the above findings. IMPRESSION: Vascular: No evidence of aortic dissection or acute aortic vascular pathology. Nonvascular: 2.0 cm mass in the left atrium consistent with the patient's known left atrial myxoma. Small bilateral pulmonary nodules measuring 2 mm. No follow-up needed if patient is low-risk (and has no known or suspected primary neoplasm). Non-contrast chest CT can be considered in 12 months if patient is high-risk. This recommendation follows the consensus statement: Guidelines for Management of Incidental Pulmonary Nodules Detected on CT Images: From the Fleischner Society 2017; Radiology 2017; 284:228-243. Bilateral  nephrolithiasis. L5-S1 grade 1 spondylolisthesis. Electronically Signed   By: Marybelle Killings M.D.   On: 04/12/2018 14:24   Vas US Doppler Pre Cabg  Result Date: 04/11/2018 PREOPERATIVE VASCULAR EVALUATION  Indications:      Pre CABG evaluation. Limitations:      Tachycardiac Comparison Study: No comparison study available Performing Technologist: Rudell Cobb RDMS, RVT  Examination Guidelines: A complete evaluation includes B-mode imaging, spectral Doppler, color Doppler, and power Doppler as needed of all accessible portions of each vessel. Bilateral testing is considered an integral part of a complete examination. Limited examinations for reoccurring indications may be performed as noted.  Right Carotid Findings: +----------+--------+--------+--------+--------+--------+           PSV cm/sEDV  cm/sStenosisDescribeComments +----------+--------+--------+--------+--------+--------+ CCA Prox  130     25                               +----------+--------+--------+--------+--------+--------+ CCA Distal99      28                               +----------+--------+--------+--------+--------+--------+ ICA Prox  45      14      1-39%                    +----------+--------+--------+--------+--------+--------+ ICA Distal67      22                               +----------+--------+--------+--------+--------+--------+ ECA       122     16                               +----------+--------+--------+--------+--------+--------+ Portions of this table do not appear on this page. +----------+--------+-------+--------+------------+           PSV cm/sEDV cmsDescribeArm Pressure +----------+--------+-------+--------+------------+ Subclavian105                                 +----------+--------+-------+--------+------------+ +---------+--------+--+--------+--+---------+ VertebralPSV cm/s58EDV cm/s18Antegrade +---------+--------+--+--------+--+---------+ Left Carotid Findings: +----------+--------+--------+--------+--------+--------+           PSV cm/sEDV cm/sStenosisDescribeComments +----------+--------+--------+--------+--------+--------+ CCA Prox  95      16                               +----------+--------+--------+--------+--------+--------+ CCA Distal86      23                               +----------+--------+--------+--------+--------+--------+ ICA Prox  70      26      1-39%                    +----------+--------+--------+--------+--------+--------+ ICA Mid   73      26                               +----------+--------+--------+--------+--------+--------+ ICA Distal73      27                               +----------+--------+--------+--------+--------+--------+  ECA       125     27                                +----------+--------+--------+--------+--------+--------+ +----------+--------+--------+--------+------------+ SubclavianPSV cm/sEDV cm/sDescribeArm Pressure +----------+--------+--------+--------+------------+           105                     83           +----------+--------+--------+--------+------------+ +---------+--------+--+--------+--+---------+ VertebralPSV cm/s50EDV cm/s16Antegrade +---------+--------+--+--------+--+---------+  ABI Findings: +--------+------------------+-----+---------+-----------------------+ Right   Rt Pressure (mmHg)IndexWaveform Comment                 +--------+------------------+-----+---------+-----------------------+ Brachial                       triphasicCatheterization on site +--------+------------------+-----+---------+-----------------------+ PTA     205               2.47 triphasic                        +--------+------------------+-----+---------+-----------------------+ DP      199               2.40 triphasic                        +--------+------------------+-----+---------+-----------------------+ +--------+------------------+-----+---------+-------+ Left    Lt Pressure (mmHg)IndexWaveform Comment +--------+------------------+-----+---------+-------+ [id number]                     triphasic        +--------+------------------+-----+---------+-------+ PTA     192               2.31 triphasic        +--------+------------------+-----+---------+-------+ DP      182               2.19 triphasic        +--------+------------------+-----+---------+-------+ +-------+---------------+----------------+ ABI/TBIToday's ABI/TBIPrevious ABI/TBI +-------+---------------+----------------+ Right  2.47                            +-------+---------------+----------------+ Left   2.31                            +-------+---------------+----------------+  Right Doppler Findings:  +--------+--------+-----+---------+-----------------------+ Site    PressureIndexDoppler  Comments                +--------+--------+-----+---------+-----------------------+ Brachial             triphasicCatheterization on site +--------+--------+-----+---------+-----------------------+ Radial               triphasic                        +--------+--------+-----+---------+-----------------------+ Ulnar                biphasic                         +--------+--------+-----+---------+-----------------------+  Left Doppler Findings: +--------+--------+-----+---------+--------+ Site    PressureIndexDoppler  Comments +--------+--------+-----+---------+--------+ [id number]           triphasic         +--------+--------+-----+---------+--------+ Radial               triphasic         +--------+--------+-----+---------+--------+  Ulnar                triphasic         +--------+--------+-----+---------+--------+  Summary: Right Carotid: Velocities in the right ICA are consistent with a 1-39% stenosis. Left Carotid: Velocities in the left ICA are consistent with a 1-39% stenosis. Vertebrals: Bilateral vertebral arteries demonstrate antegrade flow. Right ABI: Resting right ankle-brachial index indicates noncompressible right lower extremity arteries. Left ABI: Resting left ankle-brachial index indicates noncompressible left lower extremity arteries. Right Upper Extremity: Doppler waveforms remain within normal limits with right radial compression. Doppler waveform obliterate with right ulnar compression. Left Upper Extremity: Doppler waveforms remain within normal limits with left radial compression. Doppler waveform obliterate with left ulnar compression.  Electronically signed by Curt Jews MD on 04/11/2018 at 4:52:49 PM.    Final     Cardiac Studies   TEE 04/10/2018  - Left ventricle: The cavity size was normal. Wall thickness was normal. Systolic function was  normal. The estimated ejection fraction was in the range of 55% to 60%. Wall motion was normal; there were no regional wall motion abnormalities. - Left atrium: There is a medium-sized mobilemyxoma attached to the fossa ovalis. - Right atrium: No evidence of thrombus in the atrial cavity or appendage. - Atrial septum: No defect or patent foramen ovale was identified.  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 These usually run posterior to the aorta and not intra-arterial. We were unable to confirm the course.  LAD contains mid 40% eccentric narrowing.  Right coronary is dominant and without obstruction.  Normal LVEDP.  No tumor blush is noted, hence possibly explaining fragmentation of the myxoma leading to multiple emboli.  Tachycardia and hypotension secondary to beta-blocker withdrawal  Patient Profile     63 y.o. 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  Assessment & Plan    1. LA myxoma:  Scheduled for surgery 04/15/2018 2. CVA/residual expressive aphasia, right side weakness seems resolved.  Has residual expressive aphasia. 3. HTN: Blood pressure and heart rate have improved on the higher dose of beta-blocker, will allow another 24 hours at this dose before further titration 4. HLP:  Statin started on this admission. 5. Minor CAD: asymptomatic, not clinically important at this time, byt target LDL<70.     For questions or updates, please contact [facility] Please consult [email or web address] for contact info under        Signed, Sanda Klein, MD  04/13/2018, 10:46 AM

## 2018-04-13 NOTE — Progress Notes (Signed)
ANTICOAGULATION CONSULT NOTE -follow up  Pharmacy Consult for Heparin Indication: CVA with atrial myxoma  No Known Allergies  Patient Measurements: Height: 5\' 3"  (160 cm) Weight: 188 lb 15 oz (85.7 kg) IBW/kg (Calculated) : 56.9 Heparin Dosing Weight: 85.7 kg  Vital Signs: Temp: 97.7 F (36.5 C) (02/02 0350) Temp Source: Oral (02/02 0350) BP: 144/89 (02/02 0350) Pulse Rate: 84 (02/02 0350)  Labs: Recent Labs    04/11/18 0643  04/12/18 0607 04/12/18 1437 04/13/18 0535  HGB 14.6  --  13.4  --  12.7*  HCT 43.0  --  41.0  --  39.1  PLT 228  --  194  --  198  APTT 73*  --   --   --   --   LABPROT 14.2  --   --   --   --   INR 1.11  --   --   --   --   HEPARINUNFRC 0.28*   < > 0.39 0.40 0.44  CREATININE 1.06  --  1.00  --  1.09   < > = values in this interval not displayed.    Estimated Creatinine Clearance: 66.2 mL/min (by C-G formula based on SCr of 1.09 mg/dL).   Medical History: Past Medical History:  Diagnosis Date  . Atrial myxoma 04/08/2018  . CVA (cerebral vascular accident) ([place]) 04/08/2018  . Dyslipidemia   . Hypertension     Assessment: 63 yo male with expressive aphasia. Patient found to have CVA with atrial myxoma. Pharmacy consulted to dose heparin. No anticoagulation PTA noted.  TCTS notes tentatively plan minimally-invasive resection of left atrial myxoma on Tuesday 2/4.  2/2: Heparin level 0.44, therapeutic on heparin drip 1400 units/hr. No bleeding reported. Hgb 12.7, pltc 198   Goal of Therapy:  Heparin level 0.3-0.5 units/mL Monitor platelets by anticoagulation protocol: Yes   Plan:  Continue heparin drip at 1400 units/hr Daily heparin level, CBC  Thank you for involving pharmacy in this patient's care.  Janae Bridgeman, PharmD PGY1 Pharmacy Resident Phone: [phone] 04/13/2018 7:13 AM

## 2018-04-14 ENCOUNTER — Inpatient Hospital Stay (HOSPITAL_COMMUNITY): Payer: BLUE CROSS/BLUE SHIELD

## 2018-04-14 LAB — PULMONARY FUNCTION TEST
FEF 25-75 Pre: 0.75 L/sec
FEF2575-%Pred-Pre: 32 %
FEV1-%Pred-Pre: 38 %
FEV1-Pre: 1.11 L
FEV1FVC-%Pred-Pre: 97 %
FEV6-%Pred-Pre: 41 %
FEV6-Pre: 1.5 L
FEV6FVC-%Pred-Pre: 103 %
FVC-%Pred-Pre: 39 %
FVC-Pre: 1.53 L
Pre FEV1/FVC ratio: 73 %
Pre FEV6/FVC Ratio: 98 %

## 2018-04-14 LAB — CBC
HCT: 38.2 % — ABNORMAL LOW (ref 39.0–52.0)
Hemoglobin: 12.8 g/dL — ABNORMAL LOW (ref 13.0–17.0)
MCH: 29.5 pg (ref 26.0–34.0)
MCHC: 33.5 g/dL (ref 30.0–36.0)
MCV: 88 fL (ref 80.0–100.0)
Platelets: 203 10*3/uL (ref 150–400)
RBC: 4.34 MIL/uL (ref 4.22–5.81)
RDW: 12.3 % (ref 11.5–15.5)
WBC: 8.4 10*3/uL (ref 4.0–10.5)
nRBC: 0 % (ref 0.0–0.2)

## 2018-04-14 LAB — BASIC METABOLIC PANEL
Anion gap: 8 (ref 5–15)
BUN: 12 mg/dL (ref 8–23)
CO2: 21 mmol/L — ABNORMAL LOW (ref 22–32)
Calcium: 8 mg/dL — ABNORMAL LOW (ref 8.9–10.3)
Chloride: 111 mmol/L (ref 98–111)
Creatinine, Ser: 1.03 mg/dL (ref 0.61–1.24)
GFR calc Af Amer: >60 mL/min (ref >60)
GFR calc non Af Amer: >60 mL/min (ref >60)
Glucose, Bld: 104 mg/dL — ABNORMAL HIGH (ref 70–99)
Potassium: 3.7 mmol/L (ref 3.5–5.1)
Sodium: 140 mmol/L (ref 135–145)

## 2018-04-14 MED ORDER — DOPAMINE-DEXTROSE 3.2-5 MG/ML-% IV SOLN
0.0000 ug/kg/min | INTRAVENOUS | Status: DC
  Filled 2018-04-14 (×2): qty 250

## 2018-04-14 MED ORDER — SODIUM CHLORIDE 0.9 % IV SOLN
750.0000 mg | INTRAVENOUS | Status: DC
  Filled 2018-04-14 (×2): qty 750

## 2018-04-14 MED ORDER — PHENYLEPHRINE HCL-NACL 20-0.9 MG/250ML-% IV SOLN
30.0000 ug/min | INTRAVENOUS | Status: AC
  Administered 2018-04-15: 30 ug/min via INTRAVENOUS
  Filled 2018-04-14 (×2): qty 250

## 2018-04-14 MED ORDER — KENNESTONE BLOOD CARDIOPLEGIA (KBC) MANNITOL SYRINGE (20%, 32ML)
32.0000 mL | Freq: Once | INTRAVENOUS | Status: DC
  Filled 2018-04-14: qty 1

## 2018-04-14 MED ORDER — DEXMEDETOMIDINE HCL IN NACL 400 MCG/100ML IV SOLN
0.1000 ug/kg/h | INTRAVENOUS | Status: AC
  Administered 2018-04-15: .5 ug/kg/h via INTRAVENOUS
  Filled 2018-04-14 (×2): qty 100

## 2018-04-14 MED ORDER — TRANEXAMIC ACID 1000 MG/10ML IV SOLN
1.5000 mg/kg/h | INTRAVENOUS | Status: AC
  Administered 2018-04-15: 1.5 mg/kg/h via INTRAVENOUS
  Filled 2018-04-14 (×2): qty 25

## 2018-04-14 MED ORDER — KENNESTONE BLOOD CARDIOPLEGIA VIAL
13.0000 mL | Freq: Once | Status: DC
  Filled 2018-04-14: qty 1

## 2018-04-14 MED ORDER — NITROGLYCERIN IN D5W 200-5 MCG/ML-% IV SOLN
2.0000 ug/min | INTRAVENOUS | Status: AC
  Administered 2018-04-15: 16.67 ug/min via INTRAVENOUS
  Filled 2018-04-14 (×2): qty 250

## 2018-04-14 MED ORDER — TRANEXAMIC ACID (OHS) BOLUS VIA INFUSION
15.0000 mg/kg | INTRAVENOUS | Status: AC
  Administered 2018-04-15: 1285.5 mg via INTRAVENOUS
  Filled 2018-04-14: qty 1286

## 2018-04-14 MED ORDER — VANCOMYCIN HCL 10 G IV SOLR
1250.0000 mg | INTRAVENOUS | Status: AC
  Administered 2018-04-15: 1250 mg via INTRAVENOUS
  Filled 2018-04-14 (×2): qty 1250

## 2018-04-14 MED ORDER — NOREPINEPHRINE-SODIUM CHLORIDE 4-0.9 MG/250ML-% IV SOLN
0.0000 ug/min | INTRAVENOUS | Status: DC
  Filled 2018-04-14 (×2): qty 250

## 2018-04-14 MED ORDER — SODIUM CHLORIDE 0.9 % IV SOLN
INTRAVENOUS | Status: DC
  Filled 2018-04-14 (×2): qty 30

## 2018-04-14 MED ORDER — EPINEPHRINE PF 1 MG/ML IJ SOLN
0.0000 ug/min | INTRAVENOUS | Status: DC
  Filled 2018-04-14 (×2): qty 4

## 2018-04-14 MED ORDER — MILRINONE LACTATE IN DEXTROSE 20-5 MG/100ML-% IV SOLN
0.3000 ug/kg/min | INTRAVENOUS | Status: DC
  Filled 2018-04-14 (×2): qty 100

## 2018-04-14 MED ORDER — ENSURE ENLIVE PO LIQD
237.0000 mL | Freq: Two times a day (BID) | ORAL | Status: DC
  Administered 2018-04-14: 237 mL via ORAL

## 2018-04-14 MED ORDER — SODIUM CHLORIDE 0.9 % IV SOLN
1.5000 g | INTRAVENOUS | Status: AC
  Administered 2018-04-15: 1.5 g via INTRAVENOUS
  Filled 2018-04-14 (×2): qty 1.5

## 2018-04-14 MED ORDER — MAGNESIUM SULFATE 50 % IJ SOLN
40.0000 meq | INTRAMUSCULAR | Status: DC
  Filled 2018-04-14 (×2): qty 9.85

## 2018-04-14 MED ORDER — VANCOMYCIN HCL 1000 MG IV SOLR
INTRAVENOUS | Status: AC
  Administered 2018-04-15: 1000 mL
  Filled 2018-04-14 (×2): qty 1000

## 2018-04-14 MED ORDER — POTASSIUM CHLORIDE 2 MEQ/ML IV SOLN
80.0000 meq | INTRAVENOUS | Status: DC
  Filled 2018-04-14 (×2): qty 40

## 2018-04-14 MED ORDER — PLASMA-LYTE 148 IV SOLN
INTRAVENOUS | Status: AC
  Administered 2018-04-15: 500 mL
  Filled 2018-04-14 (×2): qty 2.5

## 2018-04-14 MED ORDER — TRANEXAMIC ACID (OHS) PUMP PRIME SOLUTION
2.0000 mg/kg | INTRAVENOUS | Status: DC
  Filled 2018-04-14 (×2): qty 1.71

## 2018-04-14 MED ORDER — INSULIN REGULAR(HUMAN) IN NACL 100-0.9 UT/100ML-% IV SOLN
INTRAVENOUS | Status: AC
  Administered 2018-04-15: .9 [IU]/h via INTRAVENOUS
  Filled 2018-04-14 (×2): qty 100

## 2018-04-14 NOTE — Progress Notes (Signed)
Nutrition Follow-up  DOCUMENTATION CODES:   Not applicable  INTERVENTION:  Provide Ensure Enlive po BID, each supplement provides 350 kcal and 20 grams of protein  Encourage adequate PO intake.   NUTRITION DIAGNOSIS:   Increased nutrient needs related to other (see comment)(surgery) as evidenced by estimated needs; ongoing  GOAL:   Patient will meet greater than or equal to 90% of their needs; progressing  MONITOR:   PO intake, Supplement acceptance, Labs, Weight trends, I & O's, Skin  REASON FOR ASSESSMENT:   Consult Other (Comment)(CVA)  ASSESSMENT:   Pt is a 63 yo male with PMH of HTN and dyslipidemia. Presented to [facility] with dysarthria, expressive aphasia on 1/28. Workup revealed multiple infarcts and myxoma in left atrium. Transferred to [facility] 1/29.  Procedure 1/31: LEFT HEART CATH AND CORONARY ANGIOGRAPHY   Pt is currently on a heart healthy diet. Meal completion has been 50-100%. RD to order nutritional supplements to aid in caloric and protein needs as well as in post op healing. Plans for left atrial myxoma via right minithoracotomy tomorrow.   Labs and medications reviewed.    Diet Order:   Diet Order            Diet NPO time specified  Diet effective midnight        Diet Heart Room service appropriate? Yes; Fluid consistency: Thin  Diet effective now              EDUCATION NEEDS:   No education needs have been identified at this time  Skin:  Skin Assessment: Reviewed RN Assessment  Last BM:  2/2  Height:   Ht Readings from Last 1 Encounters:  04/14/18 5\' 3"  (1.6 m)    Weight:   Wt Readings from Last 1 Encounters:  04/12/18 85.7 kg    Ideal Body Weight:  59.09 kg  BMI:  Body mass index is 33.47 kg/m.  Estimated Nutritional Needs:   Kcal:  1800-2000  Protein:  90-105 grams  Fluid:  1.8 - 2 L/day    Corrin Parker, MS, RD, LDN Pager # [phone] After hours/ weekend pager # [phone]

## 2018-04-14 NOTE — Progress Notes (Signed)
Physical Therapy Treatment Patient Details Name: Shawn Mccann MRN: [id number] DOB: [birth year] Today's Date: 04/14/2018    History of Present Illness Pt is a 63 y/o male admitted secondary to expressive aphasia. 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 Pt is undergoing pre-op work up for CABG for resection of myxoma.  PMH including but not limited to hypertension, hyperlipidemia     PT Comments    Pt received in bed, willing to work with therapy. He reports pain 5/10 in L ankle, which limited him throughout session. Bed mobility with S for safety, no physical assist needed. Monitored vitals in sitting (HR: 105 bpm). BP remained high with all mobility but within acceptable limits for therapy. Sit to stand with minA for hip extension for final boost at top. Standing vitals HR: 116 bpm. Performed toe raises in standing, HR increased to 125 bpm. Sat pt down and HR recovered to 105 bpm. Because of HR recovery, suspect HR increases is due to deconditioning, so decided to sit him up in chair. Stand pivot transfer to chair using RW with modA to stand, min guard for pivoting for safety, then pt plopped into chair, requiring maxA for lowering to chair, reporting because of L ankle pain. Left pt in chair with needs met, call bell in reach, family in room, chair alarm active.    Follow Up Recommendations  CIR     Equipment Recommendations  Other (comment)(defer to next venue)    Recommendations for Other Services       Precautions / Restrictions Precautions Precautions: Fall Precaution Comments: Watch pt's HR with activity Restrictions Weight Bearing Restrictions: No    Mobility  Bed Mobility Overal bed mobility: Needs Assistance Bed Mobility: Supine to Sit     Supine to sit: Supervision     General bed mobility comments: Pt able to move to sit from supine with no physical assist given. Supervision for safety  Transfers Overall  transfer level: Needs assistance Equipment used: Rolling walker (2 wheeled)   Sit to Stand: Min assist;Mod assist(MinA on first attempt; ModA on second)         General transfer comment: Able to stand from bed with minA on first attempt; modA on second attempt due to fatigue. MaxA for eccentric lowering to chair.  Ambulation/Gait Ambulation/Gait assistance: (unable due to elevated HR with standing)               Stairs             Wheelchair Mobility    Modified Rankin (Stroke Patients Only)       Balance Overall balance assessment: Needs assistance Sitting-balance support: Feet supported;Bilateral upper extremity supported Sitting balance-Leahy Scale: Fair     Standing balance support: Bilateral upper extremity supported;During functional activity Standing balance-Leahy Scale: Poor                              Cognition Arousal/Alertness: Awake/alert Behavior During Therapy: WFL for tasks assessed/performed Overall Cognitive Status: Difficult to assess Area of Impairment: Problem solving;Following commands                       Following Commands: Follows one step commands consistently;Follows one step commands with increased time;Follows multi-step commands inconsistently     Problem Solving: Difficulty sequencing;Requires verbal cues;Requires tactile cues        Exercises  General Comments        Pertinent Vitals/Pain Pain Assessment: 0-10 Pain Score: 5  Pain Location: Lt ankle - pt reports gout  Pain Descriptors / Indicators: Grimacing;Guarding Pain Intervention(s): Monitored during session;Limited activity within patient's tolerance    Home Living                      Prior Function            PT Goals (current goals can now be found in the care plan section) Acute Rehab PT Goals Patient Stated Goal: Pt unable to state  PT Goal Formulation: Patient unable to participate in goal setting Time For  Goal Achievement: 04/24/18 Potential to Achieve Goals: Good Progress towards PT goals: Progressing toward goals    Frequency    Min 4X/week      PT Plan Current plan remains appropriate    Co-evaluation              AM-PAC PT "6 Clicks" Mobility   Outcome Measure  Help needed turning from your back to your side while in a flat bed without using bedrails?: None Help needed moving from lying on your back to sitting on the side of a flat bed without using bedrails?: A Little Help needed moving to and from a bed to a chair (including a wheelchair)?: A Lot Help needed standing up from a chair using your arms (e.g., wheelchair or bedside chair)?: A Lot Help needed to walk in hospital room?: A Lot Help needed climbing 3-5 steps with a railing? : Total 6 Click Score: 14    End of Session Equipment Utilized During Treatment: Gait belt Activity Tolerance: Patient limited by fatigue;Patient limited by pain Patient left: with call bell/phone within reach;in chair;with chair alarm set Nurse Communication: Mobility status PT Visit Diagnosis: Other abnormalities of gait and mobility (R26.89);Other symptoms and signs involving the nervous system (R29.898)     Time:  -     Charges:                        Emma Schultz, SPT    Emma Schultz 04/14/2018, 3:48 PM   

## 2018-04-14 NOTE — Progress Notes (Signed)
PROGRESS NOTE    Shawn Mccann  FWY:[id number] DOB: 29-Apr-1955 DOA: 04/09/2018 PCP: Ocie Doyne., MD     Brief Narrative:  Shawn Mccann is a 63 year old male with past medical history significant for hypertension presented with dysarthria.  Evaluation at [facility] has revealed multiple infarcts and echocardiogram revealed 2.2 x 1.3 mm left atrial mass suspicious for atrial myxoma.  Patient was transferred to was [facility].  Cardiology consulted, cardiothoracic surgery also consulted.  Planning for left atrial myxoma resection on 2/4.  New events last 24 hours / Subjective: Feeling well without any complaints.  Denies any chest pain or shortness of breath.  Assessment & Plan:   Principal Problem:   Atrial myxoma Active Problems:   CVA (cerebral vascular accident) ([place])   Hypertension   Dyslipidemia  Acute cardioembolic CVA with residual expressive aphasia  Source of emboli thought to be secondary to atrial myxoma Appreciate neurology, recommend antiplatelet post-op. Follow up with [facility] in 4 weeks for stroke follow up. Neurology signed off 1/31  CIR evaluation pending postoperatively Antiplatelet regimen per cardiology  Left atrial myxoma Plan for cardiothoracic surgery for resection of left atrial myxoma 2/4 Continue IV heparin  Essential hypertension Continue Toprol  Acute left ankle gout flare Continue colchicine  Hyperlipidemia Continue Lipitor  Other incidental findings in hospital Diffuse hepatic steatosis Small bilateral pulmonary nodules L5-S1 grade 1 spondylolisthesis Bilateral nephrolithiasis    DVT prophylaxis: IV heparin Code Status: Full Family Communication: No family at bedside Disposition Plan: Pending surgical intervention, CIR eval post-op   Consultants:   Cardiology  Neurology  Cardiothoracic surgery   Procedures:   None   Antimicrobials:  Anti-infectives (From admission, onward)   None        Objective: Vitals:   04/13/18 2359 04/14/18 0310 04/14/18 0715 04/14/18 1100  BP: (!) 156/89 (!) 153/93 (!) 160/83 (!) 171/98  Pulse: 88 (!) 106 82   Resp:   18 20  Temp: 99.1 F (37.3 C) 98.2 F (36.8 C) (!) 97.5 F (36.4 C) 97.6 F (36.4 C)  TempSrc: Oral Oral Oral Oral  SpO2: 99% 98% 100% 98%  Weight:      Height:   5\' 3"  (1.6 m)     Intake/Output Summary (Last 24 hours) at 04/14/2018 1145 Last data filed at 04/14/2018 0900 Gross per 24 hour  Intake 1559.35 ml  Output 870 ml  Net 689.35 ml   Filed Weights   04/09/18 1547 04/12/18 0900  Weight: 85.7 kg 85.7 kg    Examination:  General exam: Appears calm and comfortable  Respiratory system: Clear to auscultation. Respiratory effort normal. Cardiovascular system: S1 & S2 heard, RRR. No JVD, murmurs, rubs, gallops or clicks. No pedal edema. Gastrointestinal system: Abdomen is nondistended, soft and nontender. No organomegaly or masses felt. Normal bowel sounds heard. Central nervous system: Alert and oriented  Extremities: Symmetric 5 x 5 power. Skin: No rashes, lesions or ulcers Psychiatry: Judgement and insight appear normal. Mood & affect appropriate.   Data Reviewed: I have personally reviewed following labs and imaging studies  CBC: Recent Labs  Lab 04/10/18 0519 04/11/18 0643 04/12/18 0607 04/13/18 0535 04/14/18 0706  WBC 7.1 12.2* 12.1* 9.6 8.4  HGB 13.8 14.6 13.4 12.7* 12.8*  HCT 42.4 43.0 41.0 39.1 38.2*  MCV 88.1 87.4 87.8 88.5 88.0  PLT 233 228 194 198 277   Basic Metabolic Panel: Recent Labs  Lab 04/10/18 1237 04/11/18 0643 04/12/18 0607 04/13/18 0535 04/14/18 0706  NA 138 139 139  138 140  K 3.9 3.5 3.2* 3.2* 3.7  CL 109 108 108 109 111  CO2 18* 21* 21* 20* 21*  GLUCOSE 93 104* 112* 102* 104*  BUN 13 12 10 13 12   CREATININE 1.02 1.06 1.00 1.09 1.03  CALCIUM 8.5* 8.5* 8.3* 8.2* 8.0*  MG  --   --   --  1.8  --   PHOS  --   --   --  2.7  --    GFR: Estimated Creatinine Clearance:  70.1 mL/min (by C-G formula based on SCr of 1.03 mg/dL). Liver Function Tests: Recent Labs  Lab 04/11/18 0643  AST 25  ALT 19  ALKPHOS 80  BILITOT 0.6  PROT 6.8  ALBUMIN 3.4*   No results for input(s): LIPASE, AMYLASE in the last 168 hours. No results for input(s): AMMONIA in the last 168 hours. Coagulation Profile: Recent Labs  Lab 04/11/18 0643  INR 1.11   Cardiac Enzymes: No results for input(s): CKTOTAL, CKMB, CKMBINDEX, TROPONINI in the last 168 hours. BNP (last 3 results) No results for input(s): PROBNP in the last 8760 hours. HbA1C: No results for input(s): HGBA1C in the last 72 hours. CBG: Recent Labs  Lab 04/11/18 0630  GLUCAP 97   Lipid Profile: No results for input(s): CHOL, HDL, LDLCALC, TRIG, CHOLHDL, LDLDIRECT in the last 72 hours. Thyroid Function Tests: No results for input(s): TSH, T4TOTAL, FREET4, T3FREE, THYROIDAB in the last 72 hours. Anemia Panel: No results for input(s): VITAMINB12, FOLATE, FERRITIN, TIBC, IRON, RETICCTPCT in the last 72 hours. Sepsis Labs: No results for input(s): PROCALCITON, LATICACIDVEN in the last 168 hours.  Recent Results (from the past 240 hour(s))  MRSA PCR Screening     Status: None   Collection Time: 04/11/18  3:02 AM  Result Value Ref Range Status   MRSA by PCR NEGATIVE NEGATIVE Final    Comment:        The GeneXpert MRSA Assay (FDA approved for NASAL specimens only), is one component of a comprehensive MRSA colonization surveillance program. It is not intended to diagnose MRSA infection nor to guide or monitor treatment for MRSA infections. Performed at [facility], [place] [address], [place], [place] [address]        Radiology Studies: Ct Angio Chest/abd/pel For Dissection W And/or W/wo  Result Date: 04/12/2018 CLINICAL DATA:  Atrial myxoma.  Stroke. EXAM: CT ANGIOGRAPHY CHEST, ABDOMEN AND PELVIS TECHNIQUE: Multidetector CT imaging through the chest, abdomen and pelvis was performed using the  standard protocol during bolus administration of intravenous contrast. Multiplanar reconstructed images and MIPs were obtained and reviewed to evaluate the vascular anatomy. CONTRAST:  163mL ISOVUE-370 IOPAMIDOL (ISOVUE-370) INJECTION 76% COMPARISON:  None. FINDINGS: CTA CHEST FINDINGS Cardiovascular: There is no evidence of aortic dissection, acute intramural hematoma, or aneurysm. Maximal diameter of the ascending aorta is 3.3 cm. Great vessels are patent. No significant coronary artery calcifications. There is a 2.0 cm mass within the left atrium abutting the anterior atrial wall consistent with the patient's known atrial myxoma. No obvious acute pulmonary thromboembolism. Mediastinum/Nodes: There is no evidence of abnormal mediastinal adenopathy or pericardial effusion. Visualized thyroid is unremarkable. Esophagus is within normal limits. Lungs/Pleura: There are bilateral 2 mm pulmonary nodules in the right upper lobe on image 27, left upper lobe on image 39, and left lower lobe on image 56. No pneumothorax or pleural effusion. Minimal subsegmental atelectasis at the lung bases. Musculoskeletal: T6 hemangioma is noted. Irregularity of the inferior endplate is likely related to  degenerative change. No vertebral compression deformity. Review of the MIP images confirms the above findings. CTA ABDOMEN AND PELVIS FINDINGS VASCULAR Aorta: Nonaneurysmal and patent. There is no evidence of dissection. Celiac: Patent.  Branch vessels are patent. SMA: SMA is patent. Renals: A single right and 3 left renal arteries are patent. IMA: Patent. Inflow: Bilateral common, internal, and external iliac arteries are patent. There is no evidence of dissection. Review of the MIP images confirms the above findings. NON-VASCULAR Hepatobiliary: 4.8 cm cyst in the left lobe of the liver has a benign appearance. Other smaller hypodensities are also likely benign cysts. Gallbladder is unremarkable. Diffuse hepatic steatosis. Pancreas:  Unremarkable Spleen: Unremarkable Adrenals/Urinary Tract: Adrenal glands are within normal limits. There are tiny calculi in the upper and lower poles of both kidneys. Bladder is within normal limits. Stomach/Bowel: No obvious mass in the colon. No evidence of small-bowel obstruction. Stomach is decompressed. Prominence of the wall of the rectum and distal sigmoid colon is likely related to decompression. Lymphatic: No abnormal retroperitoneal adenopathy. Reproductive: Prostate is within normal limits. Other: No free fluid. Musculoskeletal: L5 pars defects.  Grade 1 L5-S1 spondylolisthesis. Review of the MIP images confirms the above findings. IMPRESSION: Vascular: No evidence of aortic dissection or acute aortic vascular pathology. Nonvascular: 2.0 cm mass in the left atrium consistent with the patient's known left atrial myxoma. Small bilateral pulmonary nodules measuring 2 mm. No follow-up needed if patient is low-risk (and has no known or suspected primary neoplasm). Non-contrast chest CT can be considered in 12 months if patient is high-risk. This recommendation follows the consensus statement: Guidelines for Management of Incidental Pulmonary Nodules Detected on CT Images: From the Fleischner Society 2017; Radiology 2017; 284:228-243. Bilateral nephrolithiasis. L5-S1 grade 1 spondylolisthesis. Electronically Signed   By: Marybelle Killings M.D.   On: 04/12/2018 14:24      Scheduled Meds: . atorvastatin  40 mg Oral q1800  . colchicine  0.6 mg Oral BID  . metoprolol succinate  50 mg Oral Daily  . sodium chloride flush  3 mL Intravenous Q12H   Continuous Infusions: . sodium chloride 75 mL/hr at 04/13/18 0733  . sodium chloride    . heparin 1,400 Units/hr (04/14/18 0945)     LOS: 5 days    Time spent: 35 minutes   Dessa Phi, DO [facility] [email or web address] 04/14/2018, 11:45 AM

## 2018-04-14 NOTE — Anesthesia Preprocedure Evaluation (Addendum)
Anesthesia Evaluation  Patient identified by MRN, date of birth, ID band Patient awake    Reviewed: Allergy & Precautions, H&P , NPO status , Patient's Chart, lab work & pertinent test results, reviewed documented beta blocker date and time   Airway Mallampati: II  TM Distance: >3 FB Neck ROM: Full    Dental no notable dental hx. (+) Teeth Intact, Dental Advisory Given   Pulmonary neg pulmonary ROS,    Pulmonary exam normal breath sounds clear to auscultation       Cardiovascular Exercise Tolerance: Good hypertension, Pt. on medications and Pt. on home beta blockers  Rhythm:Regular Rate:Normal  Atrial Myxoma   Neuro/Psych CVA negative psych ROS   GI/Hepatic negative GI ROS, Neg liver ROS,   Endo/Other  negative endocrine ROS  Renal/GU negative Renal ROS  negative genitourinary   Musculoskeletal   Abdominal   Peds  Hematology negative hematology ROS (+)   Anesthesia Other Findings   Reproductive/Obstetrics negative OB ROS                            Anesthesia Physical Anesthesia Plan  ASA: III  Anesthesia Plan: General   Post-op Pain Management:    Induction: Intravenous  PONV Risk Score and Plan: 2 and Midazolam and Ondansetron  Airway Management Planned: Double Lumen EBT  Additional Equipment: Arterial line, CVP, PA Cath, TEE and Ultrasound Guidance Line Placement  Intra-op Plan:   Post-operative Plan: Post-operative intubation/ventilation  Informed Consent: I have reviewed the patients History and Physical, chart, labs and discussed the procedure including the risks, benefits and alternatives for the proposed anesthesia with the patient or authorized representative who has indicated his/her understanding and acceptance.     Dental advisory given  Plan Discussed with: CRNA  Anesthesia Plan Comments:         Anesthesia Quick Evaluation

## 2018-04-14 NOTE — Progress Notes (Signed)
[address]       [place] [address]             [phone]     CARDIOTHORACIC SURGERY PROGRESS NOTE  3 Days Post-Op  S/P Procedure(s) (LRB): LEFT HEART CATH AND CORONARY ANGIOGRAPHY (N/A)  Subjective: No complaints.  Expressive aphasia persists.  No other symptoms  Objective: Vital signs in last 24 hours: Temp:  [97.5 F (36.4 C)-99.1 F (37.3 C)] 97.6 F (36.4 C) (02/03 1100) Pulse Rate:  [82-106] 82 (02/03 0715) Cardiac Rhythm: Normal sinus rhythm (02/03 0715) Resp:  [18-20] 20 (02/03 1100) BP: (153-171)/(81-98) 171/98 (02/03 1100) SpO2:  [97 %-100 %] 98 % (02/03 1100)  Physical Exam:  Rhythm:   sinus  Breath sounds: clear  Heart sounds:  RRR w/out murmur  Incisions:  n/a  Abdomen:  soft  Extremities:  warm   Intake/Output from previous day: 02/02 0701 - 02/03 0700 In: 1319.4 [P.O.:444; I.V.:875.4] Out: 670 [Urine:670] Intake/Output this shift: Total I/O In: 240 [P.O.:240] Out: 200 [Urine:200]  Lab Results: Recent Labs    04/13/18 0535 04/14/18 0706  WBC 9.6 8.4  HGB 12.7* 12.8*  HCT 39.1 38.2*  PLT 198 203   BMET:  Recent Labs    04/13/18 0535 04/14/18 0706  NA 138 140  K 3.2* 3.7  CL 109 111  CO2 20* 21*  GLUCOSE 102* 104*  BUN 13 12  CREATININE 1.09 1.03  CALCIUM 8.2* 8.0*    CBG (last 3)  No results for input(s): GLUCAP in the last 72 hours. PT/INR:  No results for input(s): LABPROT, INR in the last 72 hours.  CXR:  CHEST - 2 VIEW  COMPARISON:  None.  FINDINGS: Shallow inspiration with elevation of left hemidiaphragm. Heart size and pulmonary vascularity are normal for technique. Probable linear atelectasis in the lung bases. No airspace disease or consolidation. No blunting of costophrenic angles. No pneumothorax. Mediastinal contours appear intact.  IMPRESSION: Shallow inspiration with atelectasis in the lung bases.   Electronically Signed   By: Lucienne Capers M.D.   On: 04/11/2018  22:24   CT ANGIOGRAPHY CHEST, ABDOMEN AND PELVIS  TECHNIQUE: Multidetector CT imaging through the chest, abdomen and pelvis was performed using the standard protocol during bolus administration of intravenous contrast. Multiplanar reconstructed images and MIPs were obtained and reviewed to evaluate the vascular anatomy.  CONTRAST:  172mL ISOVUE-370 IOPAMIDOL (ISOVUE-370) INJECTION 76%  COMPARISON:  None.  FINDINGS: CTA CHEST FINDINGS  Cardiovascular: There is no evidence of aortic dissection, acute intramural hematoma, or aneurysm. Maximal diameter of the ascending aorta is 3.3 cm. Great vessels are patent. No significant coronary artery calcifications. There is a 2.0 cm mass within the left atrium abutting the anterior atrial wall consistent with the patient's known atrial myxoma. No obvious acute pulmonary thromboembolism.  Mediastinum/Nodes: There is no evidence of abnormal mediastinal adenopathy or pericardial effusion. Visualized thyroid is unremarkable. Esophagus is within normal limits.  Lungs/Pleura: There are bilateral 2 mm pulmonary nodules in the right upper lobe on image 27, left upper lobe on image 39, and left lower lobe on image 56. No pneumothorax or pleural effusion. Minimal subsegmental atelectasis at the lung bases.  Musculoskeletal: T6 hemangioma is noted. Irregularity of the inferior endplate is likely related to degenerative change. No vertebral compression deformity.  Review of the MIP images confirms the above findings.  CTA ABDOMEN AND PELVIS FINDINGS  VASCULAR  Aorta: Nonaneurysmal and patent. There is no evidence of dissection.  Celiac: Patent.  Branch vessels are patent.  SMA: SMA is patent.  Renals: A single right and 3 left renal arteries are patent.  IMA: Patent.  Inflow: Bilateral common, internal, and external iliac arteries are patent. There is no evidence of dissection.  Review of the MIP images confirms the  above findings.  NON-VASCULAR  Hepatobiliary: 4.8 cm cyst in the left lobe of the liver has a benign appearance. Other smaller hypodensities are also likely benign cysts. Gallbladder is unremarkable. Diffuse hepatic steatosis.  Pancreas: Unremarkable  Spleen: Unremarkable  Adrenals/Urinary Tract: Adrenal glands are within normal limits. There are tiny calculi in the upper and lower poles of both kidneys. Bladder is within normal limits.  Stomach/Bowel: No obvious mass in the colon. No evidence of small-bowel obstruction. Stomach is decompressed. Prominence of the wall of the rectum and distal sigmoid colon is likely related to decompression.  Lymphatic: No abnormal retroperitoneal adenopathy.  Reproductive: Prostate is within normal limits.  Other: No free fluid.  Musculoskeletal: L5 pars defects.  Grade 1 L5-S1 spondylolisthesis.  Review of the MIP images confirms the above findings.  IMPRESSION: Vascular:  No evidence of aortic dissection or acute aortic vascular pathology.  Nonvascular:  2.0 cm mass in the left atrium consistent with the patient's known left atrial myxoma.  Small bilateral pulmonary nodules measuring 2 mm. No follow-up needed if patient is low-risk (and has no known or suspected primary neoplasm). Non-contrast chest CT can be considered in 12 months if patient is high-risk. This recommendation follows the consensus statement: Guidelines for Management of Incidental Pulmonary Nodules Detected on CT Images: From the Fleischner Society 2017; Radiology 2017; 284:228-243.  Bilateral nephrolithiasis.  L5-S1 grade 1 spondylolisthesis.   Electronically Signed   By: Marybelle Killings M.D.   On: 04/12/2018 14:24    Assessment/Plan: S/P Procedure(s) (LRB): LEFT HEART CATH AND CORONARY ANGIOGRAPHY (N/A)  We plan resection of left atrial myxoma via right minithoracotomy approach in the operating room tomorrow.  I have again  reviewed the indications, risk, and potential benefits of surgery with the patient at the bedside this morning.  The patient understands and accepts all potential associated risks of surgery including but not limited to risk of death, stroke, myocardial infarction, congestive heart failure, respiratory failure, renal failure, bleeding requiring blood transfusion and/or reexploration, arrhythmia, heart block or bradycardia requiring permanent pacemaker, pneumonia, pleural effusion, wound infection, pulmonary embolus or other thromboembolic complication, chronic pain or other delayed complications.  Alternative surgical approaches have been discussed including a comparison between conventional sternotomy and minimally-invasive techniques.  The relative risks and benefits of each have been reviewed as they pertain to the patient's specific circumstances, and all of their questions have been addressed.  Specific risks potentially related to the minimally-invasive approach were discussed at length, including but not limited to risk of conversion to full or partial sternotomy, aortic dissection or other major vascular complication, unilateral acute lung injury or pulmonary edema, phrenic nerve dysfunction or paralysis, rib fracture, chronic pain, lung hernia, or lymphocele.  All questions answered.     Rexene Alberts, MD 04/14/2018 9:00 am

## 2018-04-14 NOTE — Progress Notes (Signed)
Progress Note  Patient Name: Shawn Mccann Date of Encounter: 04/14/2018  Primary Cardiologist: Fransico Him, MD   Subjective   No chest pain aphasia   Inpatient Medications    Scheduled Meds: . atorvastatin  40 mg Oral q1800  . colchicine  0.6 mg Oral BID  . metoprolol succinate  50 mg Oral Daily  . sodium chloride flush  3 mL Intravenous Q12H   Continuous Infusions: . sodium chloride 75 mL/hr at 04/13/18 0733  . sodium chloride    . heparin 1,400 Units/hr (04/13/18 1506)   PRN Meds: sodium chloride, acetaminophen **OR** acetaminophen (TYLENOL) oral liquid 160 mg/5 mL **OR** acetaminophen, ondansetron (ZOFRAN) IV, oxyCODONE, senna-docusate, sodium chloride flush   Vital Signs    Vitals:   04/13/18 1936 04/13/18 2359 04/14/18 0310 04/14/18 0715  BP: (!) 156/81 (!) 156/89 (!) 153/93 (!) 160/83  Pulse: 87 88 (!) 106 82  Resp:    18  Temp: 99.1 F (37.3 C) 99.1 F (37.3 C) 98.2 F (36.8 C) (!) 97.5 F (36.4 C)  TempSrc: Oral Oral Oral Oral  SpO2: 97% 99% 98% 100%  Weight:      Height:    5\' 3"  (1.6 m)    Intake/Output Summary (Last 24 hours) at 04/14/2018 0844 Last data filed at 04/14/2018 0700 Gross per 24 hour  Intake 1319.35 ml  Output 670 ml  Net 649.35 ml   Last 3 Weights 04/12/2018 04/09/2018  Weight (lbs) 188 lb 15 oz 188 lb 15 oz  Weight (kg) 85.7 kg 85.7 kg      Telemetry    Sinus rhythm- Personally Reviewed  ECG    No new tracing- Personally Reviewed  Physical Exam  Comfortable lying fully flat in bed GEN: No acute distress.   Neck: No JVD Cardiac: RRR, no murmurs, rubs, or gallops.  Respiratory: Clear to auscultation bilaterally. GI: Soft, nontender, non-distended  MS: No edema; No deformity. Neuro:  Expressive aphasia  Psych: Normal affect   Labs    Chemistry Recent Labs  Lab 04/11/18 [phone] 04/12/18 0607 04/13/18 0535 04/14/18 0706  NA 139 139 138 140  K 3.5 3.2* 3.2* 3.7  CL 108 108 109 111  CO2 21* 21* 20* 21*  GLUCOSE 104*  112* 102* 104*  BUN 12 10 13 12   CREATININE 1.06 1.00 1.09 1.03  CALCIUM 8.5* 8.3* 8.2* 8.0*  PROT 6.8  --   --   --   ALBUMIN 3.4*  --   --   --   AST 25  --   --   --   ALT 19  --   --   --   ALKPHOS 80  --   --   --   BILITOT 0.6  --   --   --   GFRNONAA >60 >60 >60 >60  GFRAA >60 >60 >60 >60  ANIONGAP 10 10 9 8      Hematology Recent Labs  Lab 04/12/18 0607 04/13/18 0535 04/14/18 0706  WBC 12.1* 9.6 8.4  RBC 4.67 4.42 4.34  HGB 13.4 12.7* 12.8*  HCT 41.0 39.1 38.2*  MCV 87.8 88.5 88.0  MCH 28.7 28.7 29.5  MCHC 32.7 32.5 33.5  RDW 12.3 12.3 12.3  PLT 194 198 203     Radiology    Ct Angio Chest/abd/pel For Dissection W And/or W/wo  Result Date: 04/12/2018 CLINICAL DATA:  Atrial myxoma.  Stroke. EXAM: CT ANGIOGRAPHY CHEST, ABDOMEN AND PELVIS TECHNIQUE: Multidetector CT imaging through the chest, abdomen and pelvis  was performed using the standard protocol during bolus administration of intravenous contrast. Multiplanar reconstructed images and MIPs were obtained and reviewed to evaluate the vascular anatomy. CONTRAST:  164mL ISOVUE-370 IOPAMIDOL (ISOVUE-370) INJECTION 76% COMPARISON:  None. FINDINGS: CTA CHEST FINDINGS Cardiovascular: There is no evidence of aortic dissection, acute intramural hematoma, or aneurysm. Maximal diameter of the ascending aorta is 3.3 cm. Great vessels are patent. No significant coronary artery calcifications. There is a 2.0 cm mass within the left atrium abutting the anterior atrial wall consistent with the patient's known atrial myxoma. No obvious acute pulmonary thromboembolism. Mediastinum/Nodes: There is no evidence of abnormal mediastinal adenopathy or pericardial effusion. Visualized thyroid is unremarkable. Esophagus is within normal limits. Lungs/Pleura: There are bilateral 2 mm pulmonary nodules in the right upper lobe on image 27, left upper lobe on image 39, and left lower lobe on image 56. No pneumothorax or pleural effusion. Minimal  subsegmental atelectasis at the lung bases. Musculoskeletal: T6 hemangioma is noted. Irregularity of the inferior endplate is likely related to degenerative change. No vertebral compression deformity. Review of the MIP images confirms the above findings. CTA ABDOMEN AND PELVIS FINDINGS VASCULAR Aorta: Nonaneurysmal and patent. There is no evidence of dissection. Celiac: Patent.  Branch vessels are patent. SMA: SMA is patent. Renals: A single right and 3 left renal arteries are patent. IMA: Patent. Inflow: Bilateral common, internal, and external iliac arteries are patent. There is no evidence of dissection. Review of the MIP images confirms the above findings. NON-VASCULAR Hepatobiliary: 4.8 cm cyst in the left lobe of the liver has a benign appearance. Other smaller hypodensities are also likely benign cysts. Gallbladder is unremarkable. Diffuse hepatic steatosis. Pancreas: Unremarkable Spleen: Unremarkable Adrenals/Urinary Tract: Adrenal glands are within normal limits. There are tiny calculi in the upper and lower poles of both kidneys. Bladder is within normal limits. Stomach/Bowel: No obvious mass in the colon. No evidence of small-bowel obstruction. Stomach is decompressed. Prominence of the wall of the rectum and distal sigmoid colon is likely related to decompression. Lymphatic: No abnormal retroperitoneal adenopathy. Reproductive: Prostate is within normal limits. Other: No free fluid. Musculoskeletal: L5 pars defects.  Grade 1 L5-S1 spondylolisthesis. Review of the MIP images confirms the above findings. IMPRESSION: Vascular: No evidence of aortic dissection or acute aortic vascular pathology. Nonvascular: 2.0 cm mass in the left atrium consistent with the patient's known left atrial myxoma. Small bilateral pulmonary nodules measuring 2 mm. No follow-up needed if patient is low-risk (and has no known or suspected primary neoplasm). Non-contrast chest CT can be considered in 12 months if patient is  high-risk. This recommendation follows the consensus statement: Guidelines for Management of Incidental Pulmonary Nodules Detected on CT Images: From the Fleischner Society 2017; Radiology 2017; 284:228-243. Bilateral nephrolithiasis. L5-S1 grade 1 spondylolisthesis. Electronically Signed   By: Marybelle Killings M.D.   On: 04/12/2018 14:24    Cardiac Studies   TEE 04/10/2018  - Left ventricle: The cavity size was normal. Wall thickness was normal. Systolic function was normal. The estimated ejection fraction was in the range of 55% to 60%. Wall motion was normal; there were no regional wall motion abnormalities. - Left atrium: There is a medium-sized mobilemyxoma attached to the fossa ovalis. - Right atrium: No evidence of thrombus in the atrial cavity or appendage. - Atrial septum: No defect or patent foramen ovale was identified.  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 These usually run posterior to the aorta and  not intra-arterial. We were unable to confirm the course.  LAD contains mid 40% eccentric narrowing.  Right coronary is dominant and without obstruction.  Normal LVEDP.  No tumor blush is noted, hence possibly explaining fragmentation of the myxoma leading to multiple emboli.  Tachycardia and hypotension secondary to beta-blocker withdrawal  Patient Profile     63 y.o. 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  Assessment & Plan    1. LA myxoma:  Scheduled for surgery 04/15/2018 in am  Minimally invasive resection of LA myxoma  2. CVA/residual expressive aphasia, right side weakness seems resolved.  Has residual expressive aphasia. 3. HTN: Blood pressure and heart rate have improved on the higher dose of beta-blocker, will allow another 24 hours at this dose before further titration 4. HLP:  Statin  started on this admission. 5. Minor CAD: asymptomatic, not clinically important at this time, byt target LDL<70.     For questions or updates, please contact [facility] Please consult [email or web address] for contact info under        Signed, Jenkins Rouge, MD  04/14/2018, 8:44 AM

## 2018-04-15 ENCOUNTER — Inpatient Hospital Stay (HOSPITAL_COMMUNITY): Payer: BLUE CROSS/BLUE SHIELD | Admitting: Certified Registered Nurse Anesthetist

## 2018-04-15 ENCOUNTER — Inpatient Hospital Stay (HOSPITAL_COMMUNITY): Payer: BLUE CROSS/BLUE SHIELD

## 2018-04-15 ENCOUNTER — Encounter (HOSPITAL_COMMUNITY)
Admission: AD | Disposition: A | Discharge status: Home Health | Payer: Self-pay | Source: Other Acute Inpatient Hospital | Attending: Thoracic Surgery (Cardiothoracic Vascular Surgery)

## 2018-04-15 ENCOUNTER — Encounter (HOSPITAL_COMMUNITY): Payer: Self-pay | Admitting: Certified Registered Nurse Anesthetist

## 2018-04-15 DIAGNOSIS — D151 Benign neoplasm of heart: Secondary | ICD-10-CM | POA: Diagnosis not present

## 2018-04-15 DIAGNOSIS — Z86018 Personal history of other benign neoplasm: Secondary | ICD-10-CM

## 2018-04-15 HISTORY — PX: TEE WITHOUT CARDIOVERSION: SHX5443

## 2018-04-15 HISTORY — PX: MINIMALLY INVASIVE EXCISION OF ATRIAL MYXOMA: SHX5974

## 2018-04-15 HISTORY — DX: Personal history of other benign neoplasm: Z86.018

## 2018-04-15 LAB — POCT I-STAT 4, (NA,K, GLUC, HGB,HCT)
Glucose, Bld: 105 mg/dL — ABNORMAL HIGH (ref 70–99)
Glucose, Bld: 109 mg/dL — ABNORMAL HIGH (ref 70–99)
Glucose, Bld: 123 mg/dL — ABNORMAL HIGH (ref 70–99)
Glucose, Bld: 124 mg/dL — ABNORMAL HIGH (ref 70–99)
HCT: 30 % — ABNORMAL LOW (ref 39.0–52.0)
HCT: 26 % — ABNORMAL LOW (ref 39.0–52.0)
HEMATOCRIT: 30 % — AB (ref 39.0–52.0)
HEMATOCRIT: 24 % — AB (ref 39.0–52.0)
Hemoglobin: 10.2 g/dL — ABNORMAL LOW (ref 13.0–17.0)
Hemoglobin: 10.2 g/dL — ABNORMAL LOW (ref 13.0–17.0)
Hemoglobin: 8.2 g/dL — ABNORMAL LOW (ref 13.0–17.0)
Hemoglobin: 8.8 g/dL — ABNORMAL LOW (ref 13.0–17.0)
POTASSIUM: 3.3 mmol/L — AB (ref 3.5–5.1)
Potassium: 3.7 mmol/L (ref 3.5–5.1)
Potassium: 4 mmol/L (ref 3.5–5.1)
Potassium: 3.9 mmol/L (ref 3.5–5.1)
SODIUM: 139 mmol/L (ref 135–145)
Sodium: 139 mmol/L (ref 135–145)
Sodium: 137 mmol/L (ref 135–145)
Sodium: 140 mmol/L (ref 135–145)

## 2018-04-15 LAB — POCT I-STAT 7, (LYTES, BLD GAS, ICA,H+H)
Acid-base deficit: 2 mmol/L (ref 0.0–2.0)
Acid-base deficit: 4 mmol/L — ABNORMAL HIGH (ref 0.0–2.0)
Bicarbonate: 22.8 mmol/L (ref 20.0–28.0)
Bicarbonate: 21.8 mmol/L (ref 20.0–28.0)
Calcium, Ion: 1.04 mmol/L — ABNORMAL LOW (ref 1.15–1.40)
Calcium, Ion: 1.13 mmol/L — ABNORMAL LOW (ref 1.15–1.40)
HCT: 26 % — ABNORMAL LOW (ref 39.0–52.0)
HCT: 30 % — ABNORMAL LOW (ref 39.0–52.0)
Hemoglobin: 8.8 g/dL — ABNORMAL LOW (ref 13.0–17.0)
Hemoglobin: 10.2 g/dL — ABNORMAL LOW (ref 13.0–17.0)
O2 Saturation: 100 %
O2 Saturation: 97 %
PCO2 ART: 38.5 mmHg (ref 32.0–48.0)
POTASSIUM: 3.7 mmol/L (ref 3.5–5.1)
Potassium: 3.9 mmol/L (ref 3.5–5.1)
SODIUM: 139 mmol/L (ref 135–145)
Sodium: 140 mmol/L (ref 135–145)
TCO2: 24 mmol/L (ref 22–32)
TCO2: 23 mmol/L (ref 22–32)
pCO2 arterial: 38.8 mmHg (ref 32.0–48.0)
pH, Arterial: 7.38 (ref 7.350–7.450)
pH, Arterial: 7.354 (ref 7.350–7.450)
pO2, Arterial: 349 mmHg — ABNORMAL HIGH (ref 83.0–108.0)
pO2, Arterial: 97 mmHg (ref 83.0–108.0)

## 2018-04-15 LAB — CBC
HCT: 35.7 % — ABNORMAL LOW (ref 39.0–52.0)
HCT: 32 % — ABNORMAL LOW (ref 39.0–52.0)
HCT: 30.8 % — ABNORMAL LOW (ref 39.0–52.0)
HEMOGLOBIN: 12.1 g/dL — AB (ref 13.0–17.0)
Hemoglobin: 10.8 g/dL — ABNORMAL LOW (ref 13.0–17.0)
Hemoglobin: 10.1 g/dL — ABNORMAL LOW (ref 13.0–17.0)
MCH: 29.5 pg (ref 26.0–34.0)
MCH: 29.9 pg (ref 26.0–34.0)
MCH: 28.9 pg (ref 26.0–34.0)
MCHC: 33.9 g/dL (ref 30.0–36.0)
MCHC: 33.8 g/dL (ref 30.0–36.0)
MCHC: 32.8 g/dL (ref 30.0–36.0)
MCV: 87.1 fL (ref 80.0–100.0)
MCV: 88.6 fL (ref 80.0–100.0)
MCV: 88.3 fL (ref 80.0–100.0)
Platelets: 214 10*3/uL (ref 150–400)
Platelets: 143 10*3/uL — ABNORMAL LOW (ref 150–400)
Platelets: 192 10*3/uL (ref 150–400)
RBC: 4.1 MIL/uL — ABNORMAL LOW (ref 4.22–5.81)
RBC: 3.61 MIL/uL — ABNORMAL LOW (ref 4.22–5.81)
RBC: 3.49 MIL/uL — ABNORMAL LOW (ref 4.22–5.81)
RDW: 12.3 % (ref 11.5–15.5)
RDW: 12.1 % (ref 11.5–15.5)
RDW: 12.4 % (ref 11.5–15.5)
WBC: 7 10*3/uL (ref 4.0–10.5)
WBC: 10.7 10*3/uL — ABNORMAL HIGH (ref 4.0–10.5)
WBC: 9.6 10*3/uL (ref 4.0–10.5)
nRBC: 0 % (ref 0.0–0.2)
nRBC: 0 % (ref 0.0–0.2)
nRBC: 0 % (ref 0.0–0.2)

## 2018-04-15 LAB — GLUCOSE, CAPILLARY
GLUCOSE-CAPILLARY: 103 mg/dL — AB (ref 70–99)
Glucose-Capillary: 108 mg/dL — ABNORMAL HIGH (ref 70–99)
Glucose-Capillary: 87 mg/dL (ref 70–99)
Glucose-Capillary: 107 mg/dL — ABNORMAL HIGH (ref 70–99)
Glucose-Capillary: 139 mg/dL — ABNORMAL HIGH (ref 70–99)

## 2018-04-15 LAB — BASIC METABOLIC PANEL
Anion gap: 6 (ref 5–15)
BUN: 15 mg/dL (ref 8–23)
CO2: 23 mmol/L (ref 22–32)
Calcium: 8.1 mg/dL — ABNORMAL LOW (ref 8.9–10.3)
Chloride: 110 mmol/L (ref 98–111)
Creatinine, Ser: 1 mg/dL (ref 0.61–1.24)
GFR calc Af Amer: >60 mL/min (ref >60)
GFR calc non Af Amer: >60 mL/min (ref >60)
Glucose, Bld: 101 mg/dL — ABNORMAL HIGH (ref 70–99)
Potassium: 3.4 mmol/L — ABNORMAL LOW (ref 3.5–5.1)
Sodium: 139 mmol/L (ref 135–145)

## 2018-04-15 LAB — TYPE AND SCREEN
ABO/RH(D): A POS
Antibody Screen: NEGATIVE

## 2018-04-15 LAB — HEMOGLOBIN AND HEMATOCRIT, BLOOD
HCT: 26.1 % — ABNORMAL LOW (ref 39.0–52.0)
Hemoglobin: 8.7 g/dL — ABNORMAL LOW (ref 13.0–17.0)

## 2018-04-15 LAB — CREATININE, SERUM
Creatinine, Ser: 0.88 mg/dL (ref 0.61–1.24)
GFR calc Af Amer: >60 mL/min (ref >60)

## 2018-04-15 LAB — APTT: aPTT: 33 seconds (ref 24–36)

## 2018-04-15 LAB — PROTIME-INR
INR: 1.41
Prothrombin Time: 17.1 seconds — ABNORMAL HIGH (ref 11.4–15.2)

## 2018-04-15 LAB — ABO/RH: ABO/RH(D): A POS

## 2018-04-15 LAB — MAGNESIUM: Magnesium: 2.6 mg/dL — ABNORMAL HIGH (ref 1.7–2.4)

## 2018-04-15 LAB — PLATELET COUNT: Platelets: 224 10*3/uL (ref 150–400)

## 2018-04-15 SURGERY — EXCISION, MYXOMA, CARDIAC ATRIUM, MINIMALLY INVASIVE
Anesthesia: General | Site: Chest

## 2018-04-15 MED ORDER — GLUTARALDEHYDE 0.625% SOAKING SOLUTION
Freq: Once | TOPICAL | Status: DC
  Filled 2018-04-15 (×2): qty 50

## 2018-04-15 MED ORDER — ALBUMIN HUMAN 5 % IV SOLN
INTRAVENOUS | Status: DC | PRN
  Administered 2018-04-15: 12:00:00 via INTRAVENOUS

## 2018-04-15 MED ORDER — PROTAMINE SULFATE 10 MG/ML IV SOLN
INTRAVENOUS | Status: AC
  Filled 2018-04-15: qty 25

## 2018-04-15 MED ORDER — ONDANSETRON HCL 4 MG/2ML IJ SOLN: 4.0000 mg | Freq: Four times a day (QID) | INTRAMUSCULAR | Status: DC | PRN

## 2018-04-15 MED ORDER — ACETAMINOPHEN 160 MG/5ML PO SOLN
650.0000 mg | Freq: Once | ORAL | Status: AC
  Administered 2018-04-15: 650 mg via ORAL
  Filled 2018-04-15: qty 20.3

## 2018-04-15 MED ORDER — ONDANSETRON HCL 4 MG/2ML IJ SOLN
INTRAMUSCULAR | Status: DC | PRN
  Administered 2018-04-15: 4 mg via INTRAVENOUS

## 2018-04-15 MED ORDER — OXYCODONE HCL 5 MG PO TABS: 5.0000 mg | ORAL_TABLET | ORAL | Status: DC | PRN

## 2018-04-15 MED ORDER — KENNESTONE BLOOD CARDIOPLEGIA (KBC) MANNITOL SYRINGE (20%, 32ML)
32.0000 mL | Freq: Once | INTRAVENOUS | Status: DC
  Filled 2018-04-15: qty 32

## 2018-04-15 MED ORDER — DOCUSATE SODIUM 100 MG PO CAPS
200.0000 mg | ORAL_CAPSULE | Freq: Every day | ORAL | Status: DC
  Administered 2018-04-16 – 2018-04-19 (×4): 200 mg via ORAL
  Filled 2018-04-15 (×7): qty 2

## 2018-04-15 MED ORDER — PROPOFOL 10 MG/ML IV BOLUS
INTRAVENOUS | Status: AC
  Filled 2018-04-15: qty 20

## 2018-04-15 MED ORDER — SODIUM CHLORIDE 0.9 % IV SOLN
INTRAVENOUS | Status: DC | PRN
  Administered 2018-04-15: 750 mg via INTRAVENOUS

## 2018-04-15 MED ORDER — HEPARIN SODIUM (PORCINE) 1000 UNIT/ML IJ SOLN
INTRAMUSCULAR | Status: AC
  Filled 2018-04-15: qty 1

## 2018-04-15 MED ORDER — ACETAMINOPHEN 650 MG RE SUPP: 650.0000 mg | Freq: Once | RECTAL | Status: DC

## 2018-04-15 MED ORDER — GLUTARALDEHYDE 0.625% SOAKING SOLUTION
TOPICAL | Status: DC | PRN
  Administered 2018-04-15: 1 via TOPICAL

## 2018-04-15 MED ORDER — SODIUM CHLORIDE 0.9 % IV SOLN
INTRAVENOUS | Status: DC
  Administered 2018-04-15: 13:00:00 via INTRAVENOUS

## 2018-04-15 MED ORDER — ACETAMINOPHEN 500 MG PO TABS
1000.0000 mg | ORAL_TABLET | Freq: Four times a day (QID) | ORAL | Status: DC
  Filled 2018-04-15: qty 2

## 2018-04-15 MED ORDER — FENTANYL CITRATE (PF) 250 MCG/5ML IJ SOLN
INTRAMUSCULAR | Status: AC
  Filled 2018-04-15: qty 25

## 2018-04-15 MED ORDER — PHENYLEPHRINE HCL-NACL 20-0.9 MG/250ML-% IV SOLN: 0.0000 ug/min | INTRAVENOUS | Status: DC

## 2018-04-15 MED ORDER — MIDAZOLAM HCL (PF) 10 MG/2ML IJ SOLN
INTRAMUSCULAR | Status: AC
  Filled 2018-04-15: qty 2

## 2018-04-15 MED ORDER — ALBUMIN HUMAN 5 % IV SOLN: 250.0000 mL | INTRAVENOUS | Status: AC | PRN

## 2018-04-15 MED ORDER — INSULIN REGULAR(HUMAN) IN NACL 100-0.9 UT/100ML-% IV SOLN
INTRAVENOUS | Status: DC
  Administered 2018-04-15: 1.3 [IU]/h via INTRAVENOUS

## 2018-04-15 MED ORDER — 0.9 % SODIUM CHLORIDE (POUR BTL) OPTIME
TOPICAL | Status: DC | PRN
  Administered 2018-04-15: 5000 mL

## 2018-04-15 MED ORDER — BISACODYL 5 MG PO TBEC
10.0000 mg | DELAYED_RELEASE_TABLET | Freq: Every day | ORAL | Status: DC
  Administered 2018-04-16 – 2018-04-19 (×4): 10 mg via ORAL
  Filled 2018-04-15 (×6): qty 2

## 2018-04-15 MED ORDER — SODIUM CHLORIDE 0.9% FLUSH: 3.0000 mL | INTRAVENOUS | Status: DC | PRN

## 2018-04-15 MED ORDER — ROCURONIUM BROMIDE 50 MG/5ML IV SOSY
PREFILLED_SYRINGE | INTRAVENOUS | Status: AC
  Filled 2018-04-15: qty 15

## 2018-04-15 MED ORDER — PROPOFOL 10 MG/ML IV BOLUS
INTRAVENOUS | Status: DC | PRN
  Administered 2018-04-15: 50 mg via INTRAVENOUS
  Administered 2018-04-15: 30 mg via INTRAVENOUS
  Administered 2018-04-15: 50 mg via INTRAVENOUS

## 2018-04-15 MED ORDER — LIDOCAINE 2% (20 MG/ML) 5 ML SYRINGE
INTRAMUSCULAR | Status: AC
  Filled 2018-04-15: qty 5

## 2018-04-15 MED ORDER — SODIUM CHLORIDE 0.9 % IV SOLN: 250.0000 mL | INTRAVENOUS | Status: DC

## 2018-04-15 MED ORDER — SODIUM CHLORIDE 0.9 % IV SOLN
INTRAVENOUS | Status: DC | PRN
  Administered 2018-04-15: 20 ug/min via INTRAVENOUS

## 2018-04-15 MED ORDER — NITROGLYCERIN 0.2 MG/ML ON CALL CATH LAB
INTRAVENOUS | Status: DC | PRN
  Administered 2018-04-15: 40 ug via INTRAVENOUS

## 2018-04-15 MED ORDER — MAGNESIUM SULFATE 4 GM/100ML IV SOLN
4.0000 g | Freq: Once | INTRAVENOUS | Status: AC
  Administered 2018-04-15: 4 g via INTRAVENOUS
  Filled 2018-04-15: qty 100

## 2018-04-15 MED ORDER — DEXAMETHASONE SODIUM PHOSPHATE 10 MG/ML IJ SOLN
INTRAMUSCULAR | Status: DC | PRN
  Administered 2018-04-15: 10 mg via INTRAVENOUS

## 2018-04-15 MED ORDER — SUCCINYLCHOLINE CHLORIDE 200 MG/10ML IV SOSY
PREFILLED_SYRINGE | INTRAVENOUS | Status: AC
  Filled 2018-04-15: qty 10

## 2018-04-15 MED ORDER — PANTOPRAZOLE SODIUM 40 MG PO TBEC
40.0000 mg | DELAYED_RELEASE_TABLET | Freq: Every day | ORAL | Status: DC
  Administered 2018-04-17 – 2018-04-23 (×7): 40 mg via ORAL
  Filled 2018-04-15 (×7): qty 1

## 2018-04-15 MED ORDER — CHLORHEXIDINE GLUCONATE CLOTH 2 % EX PADS
6.0000 | MEDICATED_PAD | Freq: Every day | CUTANEOUS | Status: DC
  Administered 2018-04-15 – 2018-04-20 (×6): 6 via TOPICAL

## 2018-04-15 MED ORDER — ARTIFICIAL TEARS OPHTHALMIC OINT
TOPICAL_OINTMENT | OPHTHALMIC | Status: AC
  Filled 2018-04-15: qty 3.5

## 2018-04-15 MED ORDER — ACETAMINOPHEN 500 MG PO TABS
1000.0000 mg | ORAL_TABLET | Freq: Four times a day (QID) | ORAL | Status: AC
  Administered 2018-04-16 – 2018-04-20 (×13): 1000 mg via ORAL
  Filled 2018-04-15 (×14): qty 2

## 2018-04-15 MED ORDER — ONDANSETRON HCL 4 MG/2ML IJ SOLN
INTRAMUSCULAR | Status: AC
  Filled 2018-04-15: qty 2

## 2018-04-15 MED ORDER — ACETAMINOPHEN 160 MG/5ML PO SOLN: 650.0000 mg | Freq: Once | ORAL | Status: DC

## 2018-04-15 MED ORDER — LACTATED RINGERS IV SOLN
INTRAVENOUS | Status: DC
  Administered 2018-04-22: 11:00:00 via INTRAVENOUS

## 2018-04-15 MED ORDER — LACTATED RINGERS IV SOLN
INTRAVENOUS | Status: DC | PRN
  Administered 2018-04-15: 07:00:00 via INTRAVENOUS

## 2018-04-15 MED ORDER — TRAMADOL HCL 50 MG PO TABS
50.0000 mg | ORAL_TABLET | ORAL | Status: DC | PRN
  Administered 2018-04-16 – 2018-04-21 (×2): 50 mg via ORAL
  Filled 2018-04-15: qty 2
  Filled 2018-04-15 (×2): qty 1

## 2018-04-15 MED ORDER — INSULIN REGULAR BOLUS VIA INFUSION
0.0000 [IU] | Freq: Three times a day (TID) | INTRAVENOUS | Status: DC
  Filled 2018-04-15: qty 10

## 2018-04-15 MED ORDER — BISACODYL 10 MG RE SUPP: 10.0000 mg | Freq: Every day | RECTAL | Status: DC

## 2018-04-15 MED ORDER — VANCOMYCIN HCL IN DEXTROSE 1-5 GM/200ML-% IV SOLN
1000.0000 mg | Freq: Once | INTRAVENOUS | Status: AC
  Administered 2018-04-15: 1000 mg via INTRAVENOUS
  Filled 2018-04-15: qty 200

## 2018-04-15 MED ORDER — ASPIRIN EC 325 MG PO TBEC
325.0000 mg | DELAYED_RELEASE_TABLET | Freq: Every day | ORAL | Status: DC
  Administered 2018-04-16 – 2018-04-18 (×3): 325 mg via ORAL
  Filled 2018-04-15 (×3): qty 1

## 2018-04-15 MED ORDER — ACETAMINOPHEN 160 MG/5ML PO SOLN: 1000.0000 mg | Freq: Four times a day (QID) | ORAL | Status: DC

## 2018-04-15 MED ORDER — CHLORHEXIDINE GLUCONATE 0.12 % MT SOLN
15.0000 mL | OROMUCOSAL | Status: AC
  Administered 2018-04-15: 15 mL via OROMUCOSAL

## 2018-04-15 MED ORDER — BUPIVACAINE HCL (PF) 0.5 % IJ SOLN
INTRAMUSCULAR | Status: AC
  Filled 2018-04-15: qty 10

## 2018-04-15 MED ORDER — DEXAMETHASONE SODIUM PHOSPHATE 10 MG/ML IJ SOLN
INTRAMUSCULAR | Status: AC
  Filled 2018-04-15: qty 1

## 2018-04-15 MED ORDER — POTASSIUM CHLORIDE 10 MEQ/50ML IV SOLN
10.0000 meq | INTRAVENOUS | Status: AC
  Administered 2018-04-15 (×3): 10 meq via INTRAVENOUS

## 2018-04-15 MED ORDER — ARTIFICIAL TEARS OPHTHALMIC OINT
TOPICAL_OINTMENT | OPHTHALMIC | Status: DC | PRN
  Administered 2018-04-15: 1 via OPHTHALMIC

## 2018-04-15 MED ORDER — MIDAZOLAM HCL 2 MG/2ML IJ SOLN: 2.0000 mg | INTRAMUSCULAR | Status: DC | PRN

## 2018-04-15 MED ORDER — ASPIRIN 81 MG PO CHEW: 324.0000 mg | CHEWABLE_TABLET | Freq: Every day | ORAL | Status: DC

## 2018-04-15 MED ORDER — ORAL CARE MOUTH RINSE
15.0000 mL | Freq: Two times a day (BID) | OROMUCOSAL | Status: DC
  Administered 2018-04-15 – 2018-04-23 (×14): 15 mL via OROMUCOSAL

## 2018-04-15 MED ORDER — FAMOTIDINE IN NACL 20-0.9 MG/50ML-% IV SOLN
20.0000 mg | Freq: Two times a day (BID) | INTRAVENOUS | Status: DC
  Filled 2018-04-15: qty 50

## 2018-04-15 MED ORDER — EPHEDRINE 5 MG/ML INJ
INTRAVENOUS | Status: AC
  Filled 2018-04-15: qty 10

## 2018-04-15 MED ORDER — LACTATED RINGERS IV SOLN
INTRAVENOUS | Status: DC
  Administered 2018-04-16: 10 mL/h via INTRAVENOUS

## 2018-04-15 MED ORDER — FENTANYL CITRATE (PF) 250 MCG/5ML IJ SOLN
INTRAMUSCULAR | Status: DC | PRN
  Administered 2018-04-15 (×2): 100 ug via INTRAVENOUS
  Administered 2018-04-15: 150 ug via INTRAVENOUS
  Administered 2018-04-15: 50 ug via INTRAVENOUS
  Administered 2018-04-15: 250 ug via INTRAVENOUS
  Administered 2018-04-15: 200 ug via INTRAVENOUS
  Administered 2018-04-15: 150 ug via INTRAVENOUS

## 2018-04-15 MED ORDER — DEXMEDETOMIDINE HCL IN NACL 200 MCG/50ML IV SOLN: 0.0000 ug/kg/h | INTRAVENOUS | Status: DC

## 2018-04-15 MED ORDER — MIDAZOLAM HCL 5 MG/5ML IJ SOLN
INTRAMUSCULAR | Status: DC | PRN
  Administered 2018-04-15: 3 mg via INTRAVENOUS
  Administered 2018-04-15 (×2): 2 mg via INTRAVENOUS

## 2018-04-15 MED ORDER — NITROGLYCERIN IN D5W 200-5 MCG/ML-% IV SOLN
0.0000 ug/min | INTRAVENOUS | Status: DC
  Administered 2018-04-15: 5 ug/min via INTRAVENOUS

## 2018-04-15 MED ORDER — BUPIVACAINE 0.5 % ON-Q PUMP SINGLE CATH 400 ML
400.0000 mL | INJECTION | Status: DC
  Filled 2018-04-15: qty 400

## 2018-04-15 MED ORDER — MORPHINE SULFATE (PF) 2 MG/ML IV SOLN: 1.0000 mg | INTRAVENOUS | Status: DC | PRN

## 2018-04-15 MED ORDER — METOPROLOL TARTRATE 5 MG/5ML IV SOLN
2.5000 mg | INTRAVENOUS | Status: DC | PRN
  Administered 2018-04-18: 5 mg via INTRAVENOUS
  Administered 2018-04-20: 2.5 mg via INTRAVENOUS
  Administered 2018-04-21 (×3): 5 mg via INTRAVENOUS
  Administered 2018-04-21: 3 mg via INTRAVENOUS
  Administered 2018-04-21 – 2018-04-22 (×3): 5 mg via INTRAVENOUS
  Filled 2018-04-15 (×10): qty 5

## 2018-04-15 MED ORDER — SUGAMMADEX SODIUM 200 MG/2ML IV SOLN
INTRAVENOUS | Status: DC | PRN
  Administered 2018-04-15: 200 mg via INTRAVENOUS

## 2018-04-15 MED ORDER — ROCURONIUM BROMIDE 50 MG/5ML IV SOSY
PREFILLED_SYRINGE | INTRAVENOUS | Status: DC | PRN
  Administered 2018-04-15: 10 mg via INTRAVENOUS
  Administered 2018-04-15 (×2): 50 mg via INTRAVENOUS
  Administered 2018-04-15: 5 mg via INTRAVENOUS
  Administered 2018-04-15: 50 mg via INTRAVENOUS

## 2018-04-15 MED ORDER — SODIUM CHLORIDE 0.45 % IV SOLN: INTRAVENOUS | Status: DC | PRN

## 2018-04-15 MED ORDER — SODIUM CHLORIDE 0.9 % IV SOLN
INTRAVENOUS | Status: AC
  Administered 2018-04-15: 14:00:00 via INTRAVENOUS

## 2018-04-15 MED ORDER — LACTATED RINGERS IV SOLN: 500.0000 mL | Freq: Once | INTRAVENOUS | Status: DC | PRN

## 2018-04-15 MED ORDER — ROCURONIUM BROMIDE 50 MG/5ML IV SOSY
PREFILLED_SYRINGE | INTRAVENOUS | Status: AC
  Filled 2018-04-15: qty 5

## 2018-04-15 MED ORDER — HEPARIN SODIUM (PORCINE) 1000 UNIT/ML IJ SOLN
INTRAMUSCULAR | Status: DC | PRN
  Administered 2018-04-15: 28000 [IU] via INTRAVENOUS

## 2018-04-15 MED ORDER — INSULIN ASPART 100 UNIT/ML ~~LOC~~ SOLN
0.0000 [IU] | SUBCUTANEOUS | Status: DC
  Administered 2018-04-15 – 2018-04-16 (×3): 2 [IU] via SUBCUTANEOUS

## 2018-04-15 MED ORDER — PROTAMINE SULFATE 10 MG/ML IV SOLN
INTRAVENOUS | Status: DC | PRN
  Administered 2018-04-15: 30 mg via INTRAVENOUS
  Administered 2018-04-15: 20 mg via INTRAVENOUS
  Administered 2018-04-15: 50 mg via INTRAVENOUS
  Administered 2018-04-15 (×2): 30 mg via INTRAVENOUS
  Administered 2018-04-15: 60 mg via INTRAVENOUS
  Administered 2018-04-15: 30 mg via INTRAVENOUS

## 2018-04-15 MED ORDER — SODIUM CHLORIDE 0.9% FLUSH
3.0000 mL | Freq: Two times a day (BID) | INTRAVENOUS | Status: DC
  Administered 2018-04-16 – 2018-04-23 (×10): 3 mL via INTRAVENOUS

## 2018-04-15 MED ORDER — BUPIVACAINE HCL 0.5 % IJ SOLN
INTRAMUSCULAR | Status: DC | PRN
  Administered 2018-04-15: 10 mL

## 2018-04-15 MED ORDER — KENNESTONE BLOOD CARDIOPLEGIA VIAL
13.0000 mL | Freq: Once | Status: DC
  Filled 2018-04-15: qty 13

## 2018-04-15 MED ORDER — SODIUM CHLORIDE 0.9 % IV SOLN
INTRAVENOUS | Status: DC | PRN
  Administered 2018-04-15: 12:00:00 via INTRAVENOUS

## 2018-04-15 MED ORDER — SODIUM CHLORIDE 0.9 % IV SOLN
1.5000 g | Freq: Two times a day (BID) | INTRAVENOUS | Status: AC
  Administered 2018-04-15 – 2018-04-17 (×4): 1.5 g via INTRAVENOUS
  Filled 2018-04-15 (×4): qty 1.5

## 2018-04-15 MED ORDER — PHENYLEPHRINE 40 MCG/ML (10ML) SYRINGE FOR IV PUSH (FOR BLOOD PRESSURE SUPPORT)
PREFILLED_SYRINGE | INTRAVENOUS | Status: AC
  Filled 2018-04-15: qty 10

## 2018-04-15 MED ORDER — LACTATED RINGERS IV SOLN
INTRAVENOUS | Status: DC | PRN
  Administered 2018-04-15 (×2): via INTRAVENOUS

## 2018-04-15 SURGICAL SUPPLY — 121 items
ADAPTER CARDIO PERF ANTE/RETRO (ADAPTER) ×3 IMPLANT
BAG DECANTER FOR FLEXI CONT (MISCELLANEOUS) ×3 IMPLANT
BLADE SURG 11 STRL SS (BLADE) ×3 IMPLANT
BOOT SUTURE AID YELLOW STND (SUTURE) ×3 IMPLANT
CANISTER SUCT 3000ML PPV (MISCELLANEOUS) ×6 IMPLANT
CANNULA FEM VENOUS REMOTE 22FR (CANNULA) ×3 IMPLANT
CANNULA FEMORAL ART 14 SM (MISCELLANEOUS) ×3 IMPLANT
CANNULA GUNDRY RCSP 15FR (MISCELLANEOUS) ×3 IMPLANT
CANNULA OPTISITE PERFUSION 16F (CANNULA) IMPLANT
CANNULA OPTISITE PERFUSION 18F (CANNULA) ×3 IMPLANT
CANNULA SUMP PERICARDIAL (CANNULA) ×6 IMPLANT
CATH KIT ON Q 5IN SLV (PAIN MANAGEMENT) IMPLANT
CATH KIT ON-Q SILVERSOAK 5IN (CATHETERS) ×3 IMPLANT
CATH ROBINSON RED A/P 18FR (CATHETERS) ×3 IMPLANT
CELLS DAT CNTRL 66122 CELL SVR (MISCELLANEOUS) ×2 IMPLANT
CONN ST 1/4X3/8  BEN (MISCELLANEOUS) ×2
CONN ST 1/4X3/8 BEN (MISCELLANEOUS) ×4 IMPLANT
CONNECTOR 1/2X3/8X1/2 3 WAY (MISCELLANEOUS) ×1
CONNECTOR 1/2X3/8X1/2 3WAY (MISCELLANEOUS) ×2 IMPLANT
CONT SPEC 4OZ CLIKSEAL STRL BL (MISCELLANEOUS) ×3 IMPLANT
COVER BACK TABLE 24X17X13 BIG (DRAPES) ×3 IMPLANT
COVER PROBE W GEL 5X96 (DRAPES) ×3 IMPLANT
COVER WAND RF STERILE (DRAPES) ×3 IMPLANT
CRADLE DONUT ADULT HEAD (MISCELLANEOUS) ×3 IMPLANT
DERMABOND ADVANCED (GAUZE/BANDAGES/DRESSINGS) ×3
DERMABOND ADVANCED .7 DNX12 (GAUZE/BANDAGES/DRESSINGS) ×6 IMPLANT
DEVICE CLOSURE PERCLS PRGLD 6F (VASCULAR PRODUCTS) ×6 IMPLANT
DEVICE PMI PUNCTURE CLOSURE (MISCELLANEOUS) ×3 IMPLANT
DEVICE TROCAR PUNCTURE CLOSURE (ENDOMECHANICALS) ×3 IMPLANT
DRAIN CHANNEL 28F RND 3/8 FF (WOUND CARE) ×6 IMPLANT
DRAPE BILATERAL SPLIT (DRAPES) ×3 IMPLANT
DRAPE C-ARM 42X72 X-RAY (DRAPES) ×3 IMPLANT
DRAPE CV SPLIT W-CLR ANES SCRN (DRAPES) ×3 IMPLANT
DRAPE INCISE IOBAN 66X45 STRL (DRAPES) ×6 IMPLANT
DRAPE PERI GROIN 82X75IN TIB (DRAPES) ×3 IMPLANT
DRAPE SLUSH/WARMER DISC (DRAPES) ×3 IMPLANT
DRSG AQUACEL AG ADV 3.5X 6 (GAUZE/BANDAGES/DRESSINGS) ×3 IMPLANT
DRSG COVADERM 4X8 (GAUZE/BANDAGES/DRESSINGS) ×3 IMPLANT
ELECT BLADE 6.5 EXT (BLADE) ×3 IMPLANT
ELECT REM PT RETURN 9FT ADLT (ELECTROSURGICAL) ×6
ELECTRODE REM PT RTRN 9FT ADLT (ELECTROSURGICAL) ×4 IMPLANT
FEMORAL VENOUS CANN RAP (CANNULA) IMPLANT
GAUZE SPONGE 4X4 12PLY STRL (GAUZE/BANDAGES/DRESSINGS) ×3 IMPLANT
GAUZE SPONGE 4X4 12PLY STRL LF (GAUZE/BANDAGES/DRESSINGS) ×3 IMPLANT
GLOVE ORTHO TXT STRL SZ7.5 (GLOVE) ×9 IMPLANT
GOWN STRL REUS W/ TWL LRG LVL3 (GOWN DISPOSABLE) ×8 IMPLANT
GOWN STRL REUS W/TWL LRG LVL3 (GOWN DISPOSABLE) ×4
GRASPER SUT TROCAR 14GX15 (MISCELLANEOUS) ×3 IMPLANT
KIT BASIN OR (CUSTOM PROCEDURE TRAY) ×3 IMPLANT
KIT DILATOR VASC 18G NDL (KITS) ×3 IMPLANT
KIT DRAINAGE VACCUM ASSIST (KITS) ×3 IMPLANT
KIT SUCTION CATH 14FR (SUCTIONS) ×3 IMPLANT
KIT TURNOVER KIT B (KITS) ×3 IMPLANT
LEAD PACING MYOCARDI (MISCELLANEOUS) ×3 IMPLANT
LINE VENT (MISCELLANEOUS) ×3 IMPLANT
NEEDLE AORTIC ROOT 14G 7F (CATHETERS) ×6 IMPLANT
NS IRRIG 1000ML POUR BTL (IV SOLUTION) ×15 IMPLANT
PACK OPEN HEART (CUSTOM PROCEDURE TRAY) ×3 IMPLANT
PAD ARMBOARD 7.5X6 YLW CONV (MISCELLANEOUS) ×6 IMPLANT
PAD ELECT DEFIB RADIOL ZOLL (MISCELLANEOUS) ×3 IMPLANT
PERCLOSE PROGLIDE 6F (VASCULAR PRODUCTS) ×9
RTRCTR WOUND ALEXIS 18CM MED (MISCELLANEOUS) ×3
SET CANNULATION TOURNIQUET (MISCELLANEOUS) ×3 IMPLANT
SET CARDIOPLEGIA MPS 5001102 (MISCELLANEOUS) ×3 IMPLANT
SET IRRIG TUBING LAPAROSCOPIC (IRRIGATION / IRRIGATOR) ×3 IMPLANT
SET MICROPUNCTURE 5F STIFF (MISCELLANEOUS) ×3 IMPLANT
SHEATH PINNACLE 6F 10CM (SHEATH) ×3 IMPLANT
SHEATH PINNACLE 8F 10CM (SHEATH) ×6 IMPLANT
SOLUTION ANTI FOG 6CC (MISCELLANEOUS) ×3 IMPLANT
SUT BONE WAX W31G (SUTURE) ×3 IMPLANT
SUT E-PACK MINIMALLY INVASIVE (SUTURE) ×3 IMPLANT
SUT ETHIBOND (SUTURE) IMPLANT
SUT ETHIBOND 2 0 SH (SUTURE) IMPLANT
SUT ETHIBOND 2 0 V4 (SUTURE) IMPLANT
SUT ETHIBOND 2 0V4 GREEN (SUTURE) IMPLANT
SUT ETHIBOND 2-0 RB-1 WHT (SUTURE) IMPLANT
SUT ETHIBOND 4 0 TF (SUTURE) IMPLANT
SUT ETHIBOND 5 0 C 1 30 (SUTURE) IMPLANT
SUT ETHIBOND NAB MH 2-0 36IN (SUTURE) IMPLANT
SUT ETHIBOND X763 2 0 SH 1 (SUTURE) ×9 IMPLANT
SUT GORETEX 6.0 TH-9 30 IN (SUTURE) IMPLANT
SUT GORETEX CV 4 TH 22 36 (SUTURE) ×3 IMPLANT
SUT GORETEX CV-5THC-13 36IN (SUTURE) IMPLANT
SUT GORETEX CV4 TH-18 (SUTURE) ×6 IMPLANT
SUT GORETEX TH-18 36 INCH (SUTURE) IMPLANT
SUT MNCRL AB 3-0 PS2 18 (SUTURE) IMPLANT
SUT PROLENE 3 0 SH DA (SUTURE) IMPLANT
SUT PROLENE 3 0 SH1 36 (SUTURE) ×30 IMPLANT
SUT PROLENE 4 0 RB 1 (SUTURE) ×1
SUT PROLENE 4-0 RB1 .5 CRCL 36 (SUTURE) ×2 IMPLANT
SUT PROLENE 5 0 C 1 36 (SUTURE) IMPLANT
SUT PROLENE 6 0 C 1 30 (SUTURE) IMPLANT
SUT SILK  1 MH (SUTURE)
SUT SILK 1 MH (SUTURE) IMPLANT
SUT SILK 1 TIES 10X30 (SUTURE) IMPLANT
SUT SILK 2 0 SH CR/8 (SUTURE) IMPLANT
SUT SILK 2 0 TIES 10X30 (SUTURE) IMPLANT
SUT SILK 2 0SH CR/8 30 (SUTURE) IMPLANT
SUT SILK 3 0 (SUTURE)
SUT SILK 3 0 SH CR/8 (SUTURE) IMPLANT
SUT SILK 3 0SH CR/8 30 (SUTURE) IMPLANT
SUT SILK 3-0 18XBRD TIE 12 (SUTURE) IMPLANT
SUT TEM PAC WIRE 2 0 SH (SUTURE) IMPLANT
SUT VIC AB 2-0 CTX 36 (SUTURE) IMPLANT
SUT VIC AB 2-0 UR6 27 (SUTURE) IMPLANT
SUT VIC AB 3-0 SH 8-18 (SUTURE) IMPLANT
SUT VICRYL 2 TP 1 (SUTURE) IMPLANT
SYR 10ML LL (SYRINGE) ×3 IMPLANT
SYSTEM SAHARA CHEST DRAIN ATS (WOUND CARE) ×3 IMPLANT
TAPE CLOTH SOFT 2X10 (GAUZE/BANDAGES/DRESSINGS) ×3 IMPLANT
TAPE CLOTH SURG 6X10 WHT LF (GAUZE/BANDAGES/DRESSINGS) ×3 IMPLANT
TAPE UMBILICAL COTTON 1/8X30 (MISCELLANEOUS) ×3 IMPLANT
TOWEL GREEN STERILE (TOWEL DISPOSABLE) ×3 IMPLANT
TOWEL GREEN STERILE FF (TOWEL DISPOSABLE) ×3 IMPLANT
TROCAR XCEL BLADELESS 5X75MML (TROCAR) ×3 IMPLANT
TROCAR XCEL NON-BLD 11X100MML (ENDOMECHANICALS) ×6 IMPLANT
TUBE SUCT INTRACARD DLP 20F (MISCELLANEOUS) ×3 IMPLANT
TUNNELER SHEATH ON-Q 11GX8 DSP (PAIN MANAGEMENT) ×3 IMPLANT
UNDERPAD 30X30 (UNDERPADS AND DIAPERS) ×3 IMPLANT
WATER STERILE IRR 1000ML POUR (IV SOLUTION) ×6 IMPLANT
WIRE EMERALD 3MM-J .035X150CM (WIRE) ×9 IMPLANT

## 2018-04-15 NOTE — Anesthesia Procedure Notes (Signed)
Arterial Line Insertion Start/End2/06/2018 7:15 AM, 04/15/2018 7:26 AM Performed by: Harden Mo, CRNA, CRNA  Patient location: Pre-op. Preanesthetic checklist: patient identified, IV checked, site marked, risks and benefits discussed, surgical consent, monitors and equipment checked, pre-op evaluation and anesthesia consent Lidocaine 1% used for infiltration and patient sedated Left, radial was placed Catheter size: 20 G Hand hygiene performed  and maximum sterile barriers used   Attempts: 1 Procedure performed without using ultrasound guided technique. Ultrasound Notes:anatomy identified, needle tip was noted to be adjacent to the nerve/plexus identified and no ultrasound evidence of intravascular and/or intraneural injection Following insertion, dressing applied and Biopatch. Post procedure assessment: normal  Patient tolerated the procedure well with no immediate complications.

## 2018-04-15 NOTE — Anesthesia Procedure Notes (Signed)
Procedure Name: Intubation Date/Time: 04/15/2018 8:01 AM Performed by: Harden Mo, CRNA Pre-anesthesia Checklist: Patient identified, Emergency Drugs available, Suction available and Patient being monitored Patient Re-evaluated:Patient Re-evaluated prior to induction Oxygen Delivery Method: Circle System Utilized Preoxygenation: Pre-oxygenation with 100% oxygen Induction Type: IV induction Ventilation: Mask ventilation without difficulty and Oral airway inserted - appropriate to patient size Laryngoscope Size: Mac and 4 Grade View: Grade I Endobronchial tube: EBT position confirmed by auscultation, EBT position confirmed by fiberoptic bronchoscope, Double lumen EBT and Left and 37 Fr Number of attempts: 1 Airway Equipment and Method: Stylet and Oral airway Placement Confirmation: ETT inserted through vocal cords under direct vision,  positive ETCO2 and breath sounds checked- equal and bilateral Tube secured with: Tape Dental Injury: Teeth and Oropharynx as per pre-operative assessment  Comments: Intubation by Ralph Leyden, SRNA

## 2018-04-15 NOTE — Brief Op Note (Signed)
04/09/2018 - 04/15/2018  12:35 PM  PATIENT:  Shawn Mccann  63 y.o. male  PRE-OPERATIVE DIAGNOSIS:  LEFT ATRIAL MASS  POST-OPERATIVE DIAGNOSIS:  LEFT ATRIAL MASS  PROCEDURE:  TRANSESOPHAGEAL ECHOCARDIOGRAM (TEE), MINIMALLY INVASIVE EXCISION OF LEFT ATRIAL MASS and   SURGEON:  Surgeon(s) and Role:    Rexene Alberts, MD - Primary  PHYSICIAN ASSISTANT: Lars Pinks PA-C  ASSISTANTS: Ara Kussmaul RNFA  ANESTHESIA:   general  EBL:  800 mL   DRAINS: Chest tubes placed in the mediastinal and pleural spaces   SPECIMEN:  Source of Specimen:  Left atrial mass  DISPOSITION OF SPECIMEN:  PATHOLOGY  COUNTS CORRECT:  YES  DICTATION: .Dragon Dictation  PLAN OF CARE: Admit to inpatient   PATIENT DISPOSITION:  ICU - intubated and hemodynamically stable.   Delay start of Pharmacological VTE agent (>24hrs) due to surgical blood loss or risk of bleeding: yes  BASELINE WEIGHT: 189 kg

## 2018-04-15 NOTE — Op Note (Addendum)
CARDIOTHORACIC SURGERY OPERATIVE NOTE  Date of Procedure:  04/15/2018  Preoperative Diagnosis: Left Atrial Myxoma  Postoperative Diagnosis: Same  Procedure:    Minimally-Invasive Resection Left Atrial Myxoma  Ultrasound-guided percutaneous arterial and venous access for cardiopulmonary bypass  Right miniature anterior thoracotomy  Autologous pericardial patch closure of interatrial septum    Surgeon: Valentina Gu. Roxy Manns, MD  Assistant: Nani Skillern, PA-C  Anesthesia: Arabella Merles, MD  Operative Findings:  Benign-appearing left atrial mass c/w atrial myxoma  Normal left ventricular systolic function               BRIEF CLINICAL NOTE AND INDICATIONS FOR SURGERY  Patient is a 63 year old male with history of hypertension and hyperlipidemia who has otherwise been healthy until he presented with acute onset expressive aphasia yesterday.  He was initially evaluated at [facility] in [place] where CT of the head revealed signs of an acute stroke.  CTA of the head and neck and MRI of the head confirmed the presence of multiple infarcts consistent with shower of embolization to both sides of the brain with a dominant lesion in the left frontal opercular cortex without any associated signs of hemorrhage.  An echocardiogram was performed suggesting the presence of left atrial myxoma.  The patient was transferred to [facility] and has been evaluated previously by both the cardiology and neurology teams.  TEE confirmed the presence of a mass eminating from the left atrial surface of the fossa ovalis with gross anatomical characteristics consistent with atrial myxoma.  The patient has been seen in consultation and counseled at length regarding the indications, risks and potential benefits of surgery.  All questions have been answered, and the patient provides full informed consent for the operation as described.    DETAILS OF THE OPERATIVE  PROCEDURE  Preparation:  The patient is brought to the operating room on the above mentioned date and central monitoring was established by the anesthesia team including placement of Swan-Ganz catheter through the left internal jugular vein.  A radial arterial line is placed. The patient is placed in the supine position on the operating table.  Intravenous antibiotics are administered. General endotracheal anesthesia is induced uneventfully. The patient is initially intubated using a dual lumen endotracheal tube.  A Foley catheter is placed.  Baseline transesophageal echocardiogram was performed.  Findings were notable for an obvious mass adherent to the left atrial surface of the interatrial septum consistent with atrial myxoma.  There was no mitral regurgitation.  There was normal left ventricular size and systolic function.  There was no aortic insufficiency.  No other abnormalities were noted.  A soft roll is placed behind the patient's left scapula and the neck gently extended and turned to the left.   The patient's right neck, chest, abdomen, both groins, and both lower extremities are prepared and draped in a sterile manner. A time out procedure is performed.   Percutaneous Vascular Access:  Percutaneous arterial and venous access were obtained on the right side.  Using ultrasound guidance the right common femoral vein was cannulated using the Seldinger technique and single Perclose vascular closure devise was placed, after which time an 8 French sheath inserted.  The right common femoral artery was cannulated using a micropuncture wire and sheath.  A pair of Perclose vascular closure devices were placed at opposing 30 degree angles in the femoral artery, and a 8 French sheath inserted.  The right internal jugular vein was cannulated  using ultrasound guidance and an  8 French sheath inserted.     Surgical Approach:  A right miniature anterolateral thoracotomy incision is performed. The  incision is placed just lateral to and superior to the right nipple. The pectoralis major muscle is retracted medially and completely preserved. The right pleural space is entered through the 3rd intercostal space. A soft tissue retractor is placed.  Two 11 mm ports are placed through separate stab incisions inferiorly. The right pleural space is insufflated continuously with carbon dioxide gas through the posterior port during the remainder of the operation.  A pledgeted sutures placed through the dome of the right hemidiaphragm and retracted inferiorly to facilitate exposure.  A longitudinal incision is made in the pericardium 3 cm anterior to the phrenic nerve and silk traction sutures are placed on either side of the incision for exposure.   Extracorporeal Cardiopulmonary Bypass and Myocardial Protection:  The patient was heparinized systemically.  The right common femoral vein is cannulated through the venous sheath and a guidewire advanced into the right atrium using TEE guidance.  The femoral vein cannulated using a 22 Fr long femoral venous cannula.  The right common femoral artery is cannulated through the arterial sheath and a guidewire advanced into the descending thoracic aorta using TEE guidance.  Femoral artery is cannulated with a 18 French femoral arterial cannula.  The right internal jugular vein is cannulated through the venous sheath and a guidewire advanced into the right atrium.  The internal jugular vein is cannulated using a 14 French pediatric femoral venous cannula.   Adequate heparinization is verified.   The entire pre-bypass portion of the operation was notable for stable hemodynamics.  Cardiopulmonary bypass was begun.  Vacuum assist venous drainage is utilized. The incision in the pericardium is extended in both directions.  Venous drainage and exposure are notably excellent. A portion of the patient's pericardium is removed and subsequently tanned in 0.625% glutaraldehyde  solution for 3 minutes.  The patch is subsequently rinsed and consecutive baths of saline per protocol.   Umbilical tapes are placed around the superior vena cava and the inferior vena cava.  An antegrade cardioplegia cannula is placed in the ascending aorta.    The patient is cooled to 32C systemic temperature.  The aortic cross clamp is applied and cardioplegia is delivered in an antegrade fashion through the aortic root using modified [place] cold blood cardioplegia ([place] blood cardioplegia protocol).   Initially there are high line pressures localized to the antegrade cannula.  The cross-clamp was temporarily removed and the existing antegrade cannula replaced with a new cannula.  The cross-clamp was replaced and cold blood cardioplegia resumed.  Line pressures are notably normal.  The initial cardioplegic arrest is rapid with early diastolic arrest.  Myocardial protection was felt to be excellent.   Resection of Left Atrial Myxoma:  A small incision is made into the left atrium through the intra-atrial groove.  The left atrial surface of the fossa ovalis is examined.  There is an obvious atrial myxoma adherent to the left atrial surface of the fossa ovalis.  A sump drain is placed across the mitral valve to serve as a left ventricular vent.  The inferior vena cava cannula is pulled down until the tip is just outside the right atrium and both umbilical tapes were snared.  An oblique right atriotomy incision was performed.  Retraction sutures are placed to expose the fossa ovalis.  Right angle clamp is passed through the left atriotomy incision and positioned along the rim  of the fossa ovalis posterior to the stalk of the myxoma.  An 11 blade knife is utilized to incise over the tip of the right angle clamp to communicate between the left and right atrium at the posterior surface of the fossa ovalis.  The entire fossa ovalis is now excised including the entire tumor with its associated stalk  attaching it to the intra-atrial septum.  The specimen is removed in 1 piece and sent to pathology for routine histology.  The defect in the intra-atrial septum is closed using an autologous pericardial patch.  The patch is trimmed to an elliptical shape and sewn in place using a 2 layer closure of running 3-0 Prolene suture.  Rewarming is begun.   Procedure Completion:  The left atriotomy was closed using a 2-layer closure of running 3-0 Prolene suture after placing a sump drain across the mitral valve to serve as a left ventricular vent.  One final dose of warm "reanimation dose" cardioplegia was administered through the aortic root.  The aortic cross clamp was removed after a total cross clamp time of 68 minutes.  The right atriotomy incision is subsequently closed using a 2 layer closure of running 3-0 Prolene suture.  Epicardial pacing wires are fixed to the inferior wall of the right ventricule and to the right atrial appendage. The patient is rewarmed to 37C temperature. The left ventricular vent is removed.  The antegrade cardioplegia cannula is removed. The patient is weaned and disconnected from cardiopulmonary bypass.  The patient's rhythm at separation from bypass was sinus.  The patient was weaned from bypass without any inotropic support. Total cardiopulmonary bypass time for the operation was 119 minutes.  Followup transesophageal echocardiogram performed after separation from bypass revealed normal left ventricular function.  There is no communication between the left and right atrium on Doppler assessment.  No other abnormalities are noted.  The femoral arterial and venous cannulas were removed and all Perclose sutures secured.  Manual pressure was maintained while Protamine was administered.   Single lung ventilation was begun. The atriotomy closure was inspected for hemostasis. The pericardial sac was drained using a 28 French Bard drain placed through the anterior port incision.   The right pleural space is irrigated with saline solution and inspected for hemostasis.   The On-Q pain management system is utilized for postoperative analgesia.  A single lumen catheter is passed through the subcutaneous tissues from the anterior chest wall to the posterior port incision.  The catheter was then passed through the port incision into the pleural space and tunneled into the subpleural space posteriorly to cover the second through the sixth intercostal nerve roots.  The catheter was flushed with 0.5% bupivacaine solution and ultimately connected to a continuous infusion pump.  The right pleural space was drained using a 28 French Bard drain placed through the posterior port incision. The miniature thoracotomy incision was closed in multiple layers in routine fashion. The right groin incision was inspected for hemostasis and closed in multiple layers in routine fashion.  The post-bypass portion of the operation was notable for stable rhythm and hemodynamics.  No blood products were administered during the operation.   Disposition:  The patient tolerated the procedure well.  The patient was extubated in the operating room and subsequently transported to the surgical intensive care unit in stable condition. There were no intraoperative complications. All sponge instrument and needle counts are verified correct at completion of the operation.     Valentina Gu. Roxy Manns MD 04/15/2018  12:38 PM

## 2018-04-15 NOTE — Transfer of Care (Signed)
Immediate Anesthesia Transfer of Care Note  Patient: Jazen Spraggins  Procedure(s) Performed: MINIMALLY INVASIVE EXCISION OF LEFT ATRIAL MYXOMA (N/A Chest) TRANSESOPHAGEAL ECHOCARDIOGRAM (TEE) (N/A )  Patient Location: ICU  Anesthesia Type:General  Level of Consciousness: awake, sedated and drowsy  Airway & Oxygen Therapy: Patient Spontanous Breathing and Patient connected to face mask oxygen  Post-op Assessment: Report given to RN, Post -op Vital signs reviewed and stable and Patient moving all extremities X 4  Post vital signs: Reviewed and stable  Last Vitals:  Vitals Value Taken Time  BP 112/73 04/15/2018  1:27 PM  Temp 36.1 C 04/15/2018  1:32 PM  Pulse    Resp 14 04/15/2018  1:32 PM  SpO2 100 % 04/15/2018  1:32 PM  Vitals shown include unvalidated device data.  Last Pain:  Vitals:   04/15/18 0410  TempSrc: Oral  PainSc:          Complications: No apparent anesthesia complications

## 2018-04-15 NOTE — Anesthesia Postprocedure Evaluation (Signed)
Anesthesia Post Note  Patient: Shawn Mccann  Procedure(s) Performed: MINIMALLY INVASIVE EXCISION OF LEFT ATRIAL MYXOMA (N/A Chest) TRANSESOPHAGEAL ECHOCARDIOGRAM (TEE) (N/A )     Patient location during evaluation: ICU Anesthesia Type: General Level of consciousness: awake and sedated Pain management: pain level controlled Vital Signs Assessment: post-procedure vital signs reviewed and stable Respiratory status: spontaneous breathing, nonlabored ventilation, respiratory function stable and patient connected to nasal cannula oxygen Cardiovascular status: blood pressure returned to baseline and stable Postop Assessment: no apparent nausea or vomiting Anesthetic complications: no    Last Vitals:  Vitals:   04/14/18 2302 04/15/18 0410  BP: (!) 159/91 (!) 143/89  Pulse: 77 61  Resp: 18 18  Temp: 36.5 C 36.4 C  SpO2: 99% 96%    Last Pain:  Vitals:   04/15/18 0410  TempSrc: Oral  PainSc:                  Shawn Mccann,W. EDMOND

## 2018-04-15 NOTE — Progress Notes (Signed)
      [address]       [place] [address]             [phone]      S/p resection LA myxoma  BP 113/73   Pulse 61   Temp 98.8 F (37.1 C)   Resp 12   Ht 5\' 3"  (1.6 m)   Wt 85.7 kg   SpO2 98%   BMI 33.47 kg/m  Ci= 2.7  Intake/Output Summary (Last 24 hours) at 04/15/2018 2201 Last data filed at 04/15/2018 2054 Gross per 24 hour  Intake 4272.47 ml  Output 3100 ml  Net 1172.47 ml   Hct= 32  Doing well early postop  Remo Lipps C. Roxan Hockey, MD [facility] [phone]

## 2018-04-15 NOTE — Progress Notes (Signed)
      [address]       [place] [address]             [phone]     CARDIOTHORACIC SURGERY PROGRESS NOTE  Subjective: Shawn Mccann has been scheduled for Procedure(s): MINIMALLY INVASIVE EXCISION OF LEFT ATRIAL MYXOMA (N/A) TRANSESOPHAGEAL ECHOCARDIOGRAM (TEE) (N/A) today.   Objective: Vital signs in last 24 hours: Temp:  [97.5 F (36.4 C)-98 F (36.7 C)] 97.6 F (36.4 C) (02/04 0410) Pulse Rate:  [61-120] 61 (02/04 0410) Cardiac Rhythm: Sinus tachycardia (02/03 1910) Resp:  [18-20] 18 (02/04 0410) BP: (143-175)/(83-104) 143/89 (02/04 0410) SpO2:  [96 %-100 %] 96 % (02/04 0410)  Physical Exam: Unchanged from previously   Intake/Output from previous day: 02/03 0701 - 02/04 0700 In: 699 [P.O.:699] Out: 500 [Urine:500] Intake/Output this shift: Total I/O In: -  Out: 300 [Urine:300]  Lab Results: Recent Labs    04/13/18 0535 04/14/18 0706  WBC 9.6 8.4  HGB 12.7* 12.8*  HCT 39.1 38.2*  PLT 198 203   BMET:  Recent Labs    04/13/18 0535 04/14/18 0706  NA 138 140  K 3.2* 3.7  CL 109 111  CO2 20* 21*  GLUCOSE 102* 104*  BUN 13 12  CREATININE 1.09 1.03  CALCIUM 8.2* 8.0*    CBG (last 3)  No results for input(s): GLUCAP in the last 72 hours. PT/INR:  No results for input(s): LABPROT, INR in the last 72 hours.  Assessment/Plan:   The various methods of treatment have been discussed with the patient. After consideration of the risks, benefits and treatment options the patient has consented to the planned procedure.   The patient has been seen and labs reviewed. There are no changes in the patient's condition to prevent proceeding with the planned procedure today.   Rexene Alberts, MD 04/15/2018 5:24 AM

## 2018-04-15 NOTE — Progress Notes (Signed)
  Echocardiogram Echocardiogram Transesophageal has been performed.  Shawn Mccann M 04/15/2018, 9:42 AM

## 2018-04-15 NOTE — Anesthesia Procedure Notes (Addendum)
Central Venous Catheter Insertion Performed by: Lillia Abed, MD, anesthesiologist Start/End2/06/2018 6:55 AM, 04/15/2018 7:10 AM Patient location: Pre-op. Preanesthetic checklist: patient identified, IV checked, site marked, risks and benefits discussed, surgical consent, monitors and equipment checked, pre-op evaluation, timeout performed and anesthesia consent Lidocaine 1% used for infiltration and patient sedated Hand hygiene performed  and maximum sterile barriers used  Catheter size: 8.5 Fr PA cath was placed.Sheath introducer Swan type:thermodilution Procedure performed using ultrasound guided technique. Ultrasound Notes:anatomy identified, needle tip was noted to be adjacent to the nerve/plexus identified, no ultrasound evidence of intravascular and/or intraneural injection and image(s) printed for medical record Attempts: 1 Following insertion, line sutured, dressing applied and Biopatch. Post procedure assessment: blood return through all ports, free fluid flow and no air  Patient tolerated the procedure well with no immediate complications.

## 2018-04-16 ENCOUNTER — Encounter (HOSPITAL_COMMUNITY): Payer: Self-pay | Admitting: Thoracic Surgery (Cardiothoracic Vascular Surgery)

## 2018-04-16 ENCOUNTER — Inpatient Hospital Stay (HOSPITAL_COMMUNITY): Payer: BLUE CROSS/BLUE SHIELD

## 2018-04-16 DIAGNOSIS — R269 Unspecified abnormalities of gait and mobility: Secondary | ICD-10-CM

## 2018-04-16 DIAGNOSIS — I69359 Hemiplegia and hemiparesis following cerebral infarction affecting unspecified side: Secondary | ICD-10-CM

## 2018-04-16 DIAGNOSIS — Z86018 Personal history of other benign neoplasm: Secondary | ICD-10-CM

## 2018-04-16 DIAGNOSIS — I69320 Aphasia following cerebral infarction: Secondary | ICD-10-CM

## 2018-04-16 DIAGNOSIS — I69398 Other sequelae of cerebral infarction: Secondary | ICD-10-CM

## 2018-04-16 LAB — BASIC METABOLIC PANEL
Anion gap: 5 (ref 5–15)
BUN: 13 mg/dL (ref 8–23)
CO2: 22 mmol/L (ref 22–32)
Calcium: 7.6 mg/dL — ABNORMAL LOW (ref 8.9–10.3)
Chloride: 110 mmol/L (ref 98–111)
Creatinine, Ser: 0.96 mg/dL (ref 0.61–1.24)
GFR calc non Af Amer: >60 mL/min (ref >60)
Glucose, Bld: 126 mg/dL — ABNORMAL HIGH (ref 70–99)
Potassium: 4.2 mmol/L (ref 3.5–5.1)
Sodium: 137 mmol/L (ref 135–145)

## 2018-04-16 LAB — CBC
HCT: 30.5 % — ABNORMAL LOW (ref 39.0–52.0)
Hemoglobin: 9.8 g/dL — ABNORMAL LOW (ref 13.0–17.0)
MCH: 28.6 pg (ref 26.0–34.0)
MCHC: 32.1 g/dL (ref 30.0–36.0)
MCV: 88.9 fL (ref 80.0–100.0)
PLATELETS: 217 10*3/uL (ref 150–400)
RBC: 3.43 MIL/uL — ABNORMAL LOW (ref 4.22–5.81)
RDW: 12.4 % (ref 11.5–15.5)
WBC: 12.5 10*3/uL — ABNORMAL HIGH (ref 4.0–10.5)
nRBC: 0 % (ref 0.0–0.2)

## 2018-04-16 LAB — GLUCOSE, CAPILLARY
Glucose-Capillary: 121 mg/dL — ABNORMAL HIGH (ref 70–99)
Glucose-Capillary: 106 mg/dL — ABNORMAL HIGH (ref 70–99)
Glucose-Capillary: 118 mg/dL — ABNORMAL HIGH (ref 70–99)
Glucose-Capillary: 117 mg/dL — ABNORMAL HIGH (ref 70–99)
Glucose-Capillary: 143 mg/dL — ABNORMAL HIGH (ref 70–99)
Glucose-Capillary: 119 mg/dL — ABNORMAL HIGH (ref 70–99)

## 2018-04-16 LAB — MAGNESIUM: Magnesium: 2.6 mg/dL — ABNORMAL HIGH (ref 1.7–2.4)

## 2018-04-16 MED ORDER — POTASSIUM CHLORIDE CRYS ER 20 MEQ PO TBCR
20.0000 meq | EXTENDED_RELEASE_TABLET | Freq: Once | ORAL | Status: AC
  Administered 2018-04-16: 20 meq via ORAL
  Filled 2018-04-16: qty 1

## 2018-04-16 MED ORDER — FUROSEMIDE 10 MG/ML IJ SOLN
20.0000 mg | Freq: Two times a day (BID) | INTRAMUSCULAR | Status: AC
  Administered 2018-04-16 (×2): 20 mg via INTRAVENOUS
  Filled 2018-04-16 (×2): qty 2

## 2018-04-16 MED ORDER — ENOXAPARIN SODIUM 40 MG/0.4ML ~~LOC~~ SOLN
40.0000 mg | SUBCUTANEOUS | Status: DC
  Administered 2018-04-17 – 2018-04-20 (×4): 40 mg via SUBCUTANEOUS
  Filled 2018-04-16 (×4): qty 0.4

## 2018-04-16 MED ORDER — AMLODIPINE BESYLATE 5 MG PO TABS
5.0000 mg | ORAL_TABLET | Freq: Every day | ORAL | Status: DC
  Administered 2018-04-16 – 2018-04-23 (×8): 5 mg via ORAL
  Filled 2018-04-16 (×8): qty 1

## 2018-04-16 MED ORDER — METOPROLOL TARTRATE 12.5 MG HALF TABLET
12.5000 mg | ORAL_TABLET | Freq: Two times a day (BID) | ORAL | Status: DC
  Administered 2018-04-16 – 2018-04-17 (×4): 12.5 mg via ORAL
  Filled 2018-04-16 (×4): qty 1

## 2018-04-16 NOTE — Progress Notes (Signed)
Progress Note  Patient Name: Shawn Mccann Date of Encounter: 04/16/2018  Primary Cardiologist: Fransico Him, MD    Subjective   Sitting in chair no complaints BP up Bradycardic nurses didn't want to use beta blocker A pacing currently   Inpatient Medications    Scheduled Meds: . acetaminophen  1,000 mg Oral Q6H  . amLODipine  5 mg Oral Daily  . aspirin EC  325 mg Oral Daily  . bisacodyl  10 mg Oral Daily   Or  . bisacodyl  10 mg Rectal Daily  . Chlorhexidine Gluconate Cloth  6 each Topical Daily  . docusate sodium  200 mg Oral Daily  . furosemide  20 mg Intravenous BID  . insulin aspart  0-24 Units Subcutaneous Q4H  . [place] Blood Cardioplegia (KBC) lidocaine 2% Syringe (75mL)  13 mL Intracoronary Once  . [place] Blood Cardioplegia (KBC) mannitol 20% Syringe (57mL)  32 mL Intracoronary Once  . mouth rinse  15 mL Mouth Rinse BID  . metoprolol tartrate  12.5 mg Oral BID  . [START ON 04/17/2018] pantoprazole  40 mg Oral Daily  . potassium chloride  20 mEq Oral Once  . sodium chloride flush  3 mL Intravenous Q12H   Continuous Infusions: . sodium chloride    . albumin human    . cefUROXime (ZINACEF)  IV 1.5 g (04/16/18 0455)  . lactated ringers 20 mL/hr at 04/16/18 0600  . lactated ringers    . nitroGLYCERIN 30 mcg/min (04/16/18 0600)   PRN Meds: albumin human, metoprolol tartrate, morphine injection, ondansetron (ZOFRAN) IV, oxyCODONE, sodium chloride flush, traMADol   Vital Signs    Vitals:   04/16/18 0400 04/16/18 0500 04/16/18 0700 04/16/18 0754  BP: 108/66 105/66 124/69   Pulse:      Resp: 19 13 20    Temp:    98.6 F (37 C)  TempSrc:    Oral  SpO2:  97% 91%   Weight:  89.2 kg    Height:        Intake/Output Summary (Last 24 hours) at 04/16/2018 0812 Last data filed at 04/16/2018 0700 Gross per 24 hour  Intake 5432.02 ml  Output 3030 ml  Net 2402.02 ml   Last 3 Weights 04/16/2018 04/12/2018 04/09/2018  Weight (lbs) 196 lb 10.4 oz 188 lb 15 oz 188 lb 15  oz  Weight (kg) 89.2 kg 85.7 kg 85.7 kg      Telemetry    A pacing underlying SB rates 55 - Personally Reviewed  ECG    A pacing  - Personally Reviewed  Physical Exam  Post right mini thoracotomy GEN: No acute distress.   Neck: No JVD Cardiac: RRR, no murmurs, rubs, or gallops.  Respiratory: Clear to auscultation bilaterally. GI: Soft, nontender, non-distended  MS: No edema; No deformity. Neuro:  Nonfocal  Psych: Normal affect   Labs    Chemistry Recent Labs  Lab 04/11/18 0643  04/14/18 0706 04/15/18 0602  04/15/18 1051 04/15/18 1200 04/15/18 1342 04/15/18 2314 04/16/18 0411  NA 139   < > 140 139   < > 137 140 140  --  137  K 3.5   < > 3.7 3.4*   < > 4.0 3.9 3.9  --  4.2  CL 108   < > 111 110  --   --   --   --   --  110  CO2 21*   < > 21* 23  --   --   --   --   --  22  GLUCOSE 104*   < > 104* 101*   < > 123* 124*  --   --  126*  BUN 12   < > 12 15  --   --   --   --   --  13  CREATININE 1.06   < > 1.03 1.00  --   --   --   --  0.88 0.96  CALCIUM 8.5*   < > 8.0* 8.1*  --   --   --   --   --  7.6*  PROT 6.8  --   --   --   --   --   --   --   --   --   ALBUMIN 3.4*  --   --   --   --   --   --   --   --   --   AST 25  --   --   --   --   --   --   --   --   --   ALT 19  --   --   --   --   --   --   --   --   --   ALKPHOS 80  --   --   --   --   --   --   --   --   --   BILITOT 0.6  --   --   --   --   --   --   --   --   --   GFRNONAA >60   < > >60 >60  --   --   --   --  >60 >60  GFRAA >60   < > >60 >60  --   --   --   --  >60 >60  ANIONGAP 10   < > 8 6  --   --   --   --   --  5   < > = values in this interval not displayed.     Hematology Recent Labs  Lab 04/15/18 1342 04/15/18 2314 04/16/18 0513  WBC 10.7* 9.6 12.5*  RBC 3.61* 3.49* 3.43*  HGB 10.2*  10.8* 10.1* 9.8*  HCT 30.0*  32.0* 30.8* 30.5*  MCV 88.6 88.3 88.9  MCH 29.9 28.9 28.6  MCHC 33.8 32.8 32.1  RDW 12.1 12.4 12.4  PLT 143* 192 217    Cardiac EnzymesNo results for input(s):  TROPONINI in the last 168 hours. No results for input(s): TROPIPOC in the last 168 hours.   BNPNo results for input(s): BNP, PROBNP in the last 168 hours.   DDimer No results for input(s): DDIMER in the last 168 hours.   Radiology    Dg Chest Port 1 View  Result Date: 04/15/2018 CLINICAL DATA:  Post op excision of LT atrial myxoma, transesophageal echocardiogram. Chest tube present. EXAM: PORTABLE CHEST 1 VIEW COMPARISON:  04/12/2018 and 04/11/2018 FINDINGS: Patient has a LEFT IJ Swan-Ganz catheter, tip overlying the pulmonary outflow tract. A RIGHT sided chest tube is in place, tip overlying the lung apex. Patchy opacities are identified at the RIGHT lung base. There is minimal subsegmental atelectasis in the LEFT mid lung zone. No pulmonary edema. IMPRESSION: Postoperative chest. RIGHT LOWER lobe opacity consistent with atelectasis. Electronically Signed   By: Nolon Nations M.D.   On: 04/15/2018 13:57    Cardiac Studies   TTE: normal EF LA myxoma  Patient Profile     63 y.o. male post resection of atrial myxoma. HTN  Assessment & Plan    1. HTN:  Have written for 5 mg norvasc to bring MAP under 90 2. Post op doing well no atrial arrhythmias will need f/u echo in a few weeks      For questions or updates, please contact [place] Please consult [email or web address] for contact info under        Signed, Jenkins Rouge, MD  04/16/2018, 8:12 AM

## 2018-04-16 NOTE — Progress Notes (Addendum)
[facility] DAILY ICU PROGRESS NOTE                   [address]            [place] [address]          [phone]   1 Day Post-Op Procedure(s) (LRB): MINIMALLY INVASIVE EXCISION OF LEFT ATRIAL MYXOMA (N/A) TRANSESOPHAGEAL ECHOCARDIOGRAM (TEE) (N/A)  Total Length of Stay:  LOS: 7 days   Subjective: Patient sitting in chair. He has incisional pain. He still has expressive aphasia  Objective: Vital signs in last 24 hours: Temp:  [97 F (36.1 C)-99 F (37.2 C)] 98.4 F (36.9 C) (02/05 0300) Cardiac Rhythm: Sinus bradycardia (02/05 0300) Resp:  [11-20] 20 (02/05 0700) BP: (104-132)/(66-83) 124/69 (02/05 0700) SpO2:  [91 %-100 %] 91 % (02/05 0700) Arterial Line BP: (95-164)/(56-109) 118/91 (02/05 0700) Weight:  [89.2 kg] 89.2 kg (02/05 0500)  Filed Weights   04/09/18 1547 04/12/18 0900 04/16/18 0500  Weight: 85.7 kg 85.7 kg 89.2 kg    Weight change:    Hemodynamic parameters for last 24 hours: PAP: (29-39)/(12-23) 30/16 CO:  [5.2 L/min] 5.2 L/min CI:  [2.7 L/min/m2] 2.7 L/min/m2  Intake/Output from previous day: 02/04 0701 - 02/05 0700 In: 5432 [P.O.:240; I.V.:3961.5; Blood:480; IV Piggyback:750.6] Out: 5631 [Urine:1700; Blood:800; Chest Tube:530]  Intake/Output this shift: No intake/output data recorded.  Current Meds: Scheduled Meds: . acetaminophen  1,000 mg Oral Q6H  . aspirin EC  325 mg Oral Daily  . bisacodyl  10 mg Oral Daily   Or  . bisacodyl  10 mg Rectal Daily  . Chlorhexidine Gluconate Cloth  6 each Topical Daily  . docusate sodium  200 mg Oral Daily  . insulin aspart  0-24 Units Subcutaneous Q4H  . [place] Blood Cardioplegia (KBC) lidocaine 2% Syringe (58mL)  13 mL Intracoronary Once  . [place] Blood Cardioplegia (KBC) mannitol 20% Syringe (4mL)  32 mL Intracoronary Once  . mouth rinse  15 mL Mouth Rinse BID  . metoprolol tartrate  12.5 mg Oral BID  . [START ON 04/17/2018] pantoprazole  40 mg Oral Daily  . sodium chloride flush   3 mL Intravenous Q12H   Continuous Infusions: . sodium chloride    . albumin human    . cefUROXime (ZINACEF)  IV 1.5 g (04/16/18 0455)  . lactated ringers 20 mL/hr at 04/16/18 0600  . lactated ringers    . nitroGLYCERIN 30 mcg/min (04/16/18 0600)   PRN Meds:.albumin human, metoprolol tartrate, morphine injection, ondansetron (ZOFRAN) IV, oxyCODONE, sodium chloride flush, traMADol  General appearance: alert, cooperative and no distress Neurologic: intact. Has expressive aphasia but able to speak more words than when seen previously Heart: RRR Lungs: Diminshed right base Abdomen: Soft, non tender, bowel sounds present Extremities: Trace LE edema;right groing wound is clean and dry. No hematoma Wound: Dressing dry and intact  Lab Results: CBC: Recent Labs    04/15/18 2314 04/16/18 0513  WBC 9.6 12.5*  HGB 10.1* 9.8*  HCT 30.8* 30.5*  PLT 192 217   BMET:  Recent Labs    04/15/18 0602  04/15/18 1200 04/15/18 1342 04/15/18 2314 04/16/18 0411  NA 139   < > 140 140  --  137  K 3.4*   < > 3.9 3.9  --  4.2  CL 110  --   --   --   --  110  CO2 23  --   --   --   --  22  GLUCOSE 101*   < > 124*  --   --  126*  BUN 15  --   --   --   --  13  CREATININE 1.00  --   --   --  0.88 0.96  CALCIUM 8.1*  --   --   --   --  7.6*   < > = values in this interval not displayed.    CMET: Lab Results  Component Value Date   WBC 12.5 (H) 04/16/2018   HGB 9.8 (L) 04/16/2018   HCT 30.5 (L) 04/16/2018   PLT 217 04/16/2018   GLUCOSE 126 (H) 04/16/2018   CHOL 168 04/10/2018   TRIG 72 04/10/2018   HDL 36 (L) 04/10/2018   LDLCALC 118 (H) 04/10/2018   ALT 19 04/11/2018   AST 25 04/11/2018   NA 137 04/16/2018   K 4.2 04/16/2018   CL 110 04/16/2018   CREATININE 0.96 04/16/2018   BUN 13 04/16/2018   CO2 22 04/16/2018   TSH 1.586 04/09/2018   INR 1.41 04/15/2018   HGBA1C 5.5 04/11/2018    PT/INR:  Recent Labs    04/15/18 1342  LABPROT 17.1*  INR 1.41   Radiology: Dg Chest  Port 1 View  Result Date: 04/15/2018 CLINICAL DATA:  Post op excision of LT atrial myxoma, transesophageal echocardiogram. Chest tube present. EXAM: PORTABLE CHEST 1 VIEW COMPARISON:  04/12/2018 and 04/11/2018 FINDINGS: Patient has a LEFT IJ Swan-Ganz catheter, tip overlying the pulmonary outflow tract. A RIGHT sided chest tube is in place, tip overlying the lung apex. Patchy opacities are identified at the RIGHT lung base. There is minimal subsegmental atelectasis in the LEFT mid lung zone. No pulmonary edema. IMPRESSION: Postoperative chest. RIGHT LOWER lobe opacity consistent with atelectasis. Electronically Signed   By: Nolon Nations M.D.   On: 04/15/2018 13:57     Assessment/Plan: S/P Procedure(s) (LRB): MINIMALLY INVASIVE EXCISION OF LEFT ATRIAL MYXOMA (N/A) TRANSESOPHAGEAL ECHOCARDIOGRAM (TEE) (N/A)   1. CV-CO/CI 5.2/2.7. SR low 60's this am. On Lopressor 12.5 mg bid (with parameters). Nitro drip turned off earlier this am. 2. Pulmonary-on 4 liters of oxygen via [place]. Chest tubes with 530 cc. Chest tubes to remain to suction. CXR this am appears to show worsening aeration on the right (effusion), cardiomegaly, no pneumothorax. Encourage incentive spirometer. 3. Volume overload-will give Lasix 20 mg IV bid today 4. Expected ABL anemia-H and H 9.8 and 30.5 5. DM-CBGs 139/121/106. Off Insulin drip. Pre op HGA1C 5.5. Will stop accu checks and SS once tolerating po better. 6. Please see progression orders   Donielle Liston Alba PA-C 04/16/2018 7:31 AM   I have seen and examined the patient and agree with the assessment and plan as outlined.  Doing very well POD1  Rexene Alberts, MD 04/16/2018 8:42 AM

## 2018-04-16 NOTE — Care Management Note (Signed)
Case Management Note  Patient Details  Name: Shawn Mccann MRN: [id number] Date of Birth: May 07, 1955  Subjective/Objective:  63 yo male presented with dysarthria and aphasia, found to have multiple infracts; s/p resection of myxoma.              Action/Plan: CM following for dispositional needs. Patient lived at home alone, employed and independent PTA. PCP: Dr. Wende Neighbors; Pharmacy: [facility]. PT/OT eval complete with CIR recommended with Dr. Letta Pate (IP Rehab MD) and the Rehab [facility] following; patient will require insurance authorization and lines removed prior to transitioning to CIR. CM team will continue to follow.   Expected Discharge Date:                  Expected Discharge Plan:  [place]  In-House Referral:  NA  Discharge planning Services  CM Consult  Post Acute Care Choice:  NA Choice offered to:  NA  DME Arranged:  N/A DME Agency:  NA  HH Arranged:  NA HH Agency:  NA  Status of Service:  In process, will continue to follow  If discussed at Long Length of Stay Meetings, dates discussed:    Additional Comments:  Midge Minium RN, BSN, NCM-BC, ACM-RN [phone] 04/16/2018, 3:24 PM

## 2018-04-16 NOTE — Evaluation (Signed)
Physical Therapy Evaluation Patient Details Name: Shawn Mccann MRN: [id number] DOB: Jun 19, 1955 Today's Date: 04/16/2018   History of Present Illness  Pt is a 63 y/o male admitted secondary to expressive aphasia. 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 Pt is undergoing pre-op work up for CABG for resection of myxoma, s/p resection of myoxoma.  PMH including but not limited to hypertension, hyperlipidemia   Clinical Impression  Orders received for PT re-evaluation s/p cardiac surgery. Patient demonstrates deficits in functional mobility as indicated below. Will benefit from continued skilled PT to address deficits and maximize function. Will see as indicated and progress as tolerated. Currently requiring increased supplemental O2 (6 liters) increased physical assist for mobility, increased time and effort for all activities. Prior to admission, patient was significantly independent. Given current status continue to feel CIR would be appropriate upon acute discharge.     Follow Up Recommendations CIR(vs HHPT pending progression)    Equipment Recommendations  Other (comment)(defer to next venue)    Recommendations for Other Services Rehab consult     Precautions / Restrictions Precautions Precautions: Fall;Sternal Precaution Comments: watch HR, chest tub, pacing wires, and bulb analgesic Restrictions Weight Bearing Restrictions: Yes      Mobility  Bed Mobility Overal bed mobility: Needs Assistance Bed Mobility: Sit to Supine       Sit to supine: Min assist   General bed mobility comments: Min assist for positioning and LE elevation to bed. +2 for line management  Transfers Overall transfer level: Needs assistance Equipment used: (eva) Transfers: Sit to/from Stand Sit to Stand: Mod assist         General transfer comment: Moderate assist to power up with heavy posterior bias, good compliance with sternal  precautions once cued  Ambulation/Gait Ambulation/Gait assistance: Min assist Gait Distance (Feet): 240 Feet Assistive device: (eval cardiac walker) Gait Pattern/deviations: Step-through pattern;Decreased step length - right;Decreased step length - left;Decreased stride length;Staggering left;Staggering right;Drifts right/left;Narrow base of support Gait velocity: decreased   General Gait Details: patient with some noted instability, heavy reliance on UE support via Eva walker, 3 standing rest breaks. Ambulated on 6 liters   Stairs            Wheelchair Mobility    Modified Rankin (Stroke Patients Only) Modified Rankin (Stroke Patients Only) Pre-Morbid Rankin Score: No symptoms Modified Rankin: Moderately severe disability     Balance Overall balance assessment: Needs assistance Sitting-balance support: Feet supported;Bilateral upper extremity supported Sitting balance-Leahy Scale: Fair Sitting balance - Comments: able to bend to feet to doff socks    Standing balance support: Bilateral upper extremity supported;During functional activity Standing balance-Leahy Scale: Poor Standing balance comment: reliance on UE support                             Pertinent Vitals/Pain Pain Assessment: Faces Faces Pain Scale: Hurts even more Pain Location: chest when returning to supine Pain Descriptors / Indicators: Grimacing;Guarding Pain Intervention(s): Monitored during session    Home Living Family/patient expects to be discharged to:: Private residence Living Arrangements: Alone Available Help at Discharge: Family;Available PRN/intermittently Type of Home: House Home Access: Stairs to enter Entrance Stairs-Rails: Can reach both Entrance Stairs-Number of Steps: 3 Home Layout: One level Home Equipment: None Additional Comments: Pt reports sisters live close by     Prior Function Level of Independence: Independent         Comments: 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    Extremity/Trunk Assessment   Upper Extremity Assessment RUE Deficits / Details: unable to fully assess Rt forearm, wrist, and hand due to A line precautions     Lower Extremity Assessment Lower Extremity Assessment: Generalized weakness    Cervical / Trunk Assessment Cervical / Trunk Assessment: Normal  Communication   Communication: Expressive difficulties  Cognition Arousal/Alertness: Awake/alert Behavior During Therapy: WFL for tasks assessed/performed Overall Cognitive Status: Difficult to assess Area of Impairment: Following commands;Problem solving                     Memory: Decreased short-term memory Following Commands: Follows one step commands consistently;Follows multi-step commands with increased time Safety/Judgement: Decreased awareness of deficits   Problem Solving: Difficulty sequencing;Requires verbal cues;Requires tactile cues        General Comments      Exercises     Assessment/Plan    PT Assessment Patient needs continued PT services  PT Problem List Decreased balance;Decreased mobility;Decreased coordination;Decreased cognition;Decreased knowledge of use of DME;Decreased safety awareness;Decreased knowledge of precautions       PT Treatment Interventions DME instruction;Gait training;Stair training;Functional mobility training;Therapeutic activities;Therapeutic exercise;Balance training;Neuromuscular re-education;Cognitive remediation;Patient/family education    PT Goals (Current goals can be found in the Care Plan section)  Acute Rehab PT Goals Patient Stated Goal: Pt unable to state  PT Goal Formulation: Patient unable to participate in goal setting Time For Goal Achievement: 04/24/18 Potential to Achieve Goals: Good    Frequency Min 4X/week   Barriers to discharge        Co-evaluation PT/OT/SLP Co-Evaluation/Treatment: Yes Reason for  Co-Treatment: For patient/therapist safety PT goals addressed during session: Mobility/safety with mobility OT goals addressed during session: ADL's and self-care       AM-PAC PT "6 Clicks" Mobility  Outcome Measure Help needed turning from your back to your side while in a flat bed without using bedrails?: None Help needed moving from lying on your back to sitting on the side of a flat bed without using bedrails?: A Little Help needed moving to and from a bed to a chair (including a wheelchair)?: A Lot Help needed standing up from a chair using your arms (e.g., wheelchair or bedside chair)?: A Lot Help needed to walk in hospital room?: A Lot Help needed climbing 3-5 steps with a railing? : Total 6 Click Score: 14    End of Session Equipment Utilized During Treatment: Gait belt Activity Tolerance: Patient limited by fatigue;Patient limited by pain Patient left: in bed;with call bell/phone within reach;with bed alarm set Nurse Communication: Mobility status PT Visit Diagnosis: Other abnormalities of gait and mobility (R26.89);Other symptoms and signs involving the nervous system (R29.898)    Time: 6503-5465 PT Time Calculation (min) (ACUTE ONLY): 28 min   Charges:   PT Evaluation $PT Re-evaluation: 1 Re-eval          Alben Deeds, PT DPT  Board Certified Neurologic Specialist [place] Pager [phone] Office [place] 04/16/2018, 9:35 AM

## 2018-04-16 NOTE — Progress Notes (Signed)
Inpatient Rehabilitation-Admissions Coordinator   Noted recommendations for CIR from PT and OT after recent resection. AC will contact MD to request and IP Rehab Consult Order.   Jhonnie Garner, OTR/L  Rehab Admissions Coordinator  [phone] 04/16/2018 10:54 AM

## 2018-04-16 NOTE — Progress Notes (Signed)
PT Cancellation Note  Patient Details Name: Shawn Mccann MRN: [id number] DOB: 1955-08-29   Cancelled Treatment:    Reason Eval/Treat Not Completed: Other (comment)(awaiting medical clearance). Pt underwent resection of myxoma - MD Please indicate when pt medically cleared to resume PT/OT.  Thank you.   Duncan Dull 04/16/2018, 7:35 AM

## 2018-04-16 NOTE — Progress Notes (Signed)
Patient ID: Shawn Mccann, male   DOB: [birth year], 63 y.o.   MRN: [id number] [facility] Evening Rounds:  Hemodynamically stable in sinus rhythm 62  sats 98%  Urine output ok   chest tube output low.

## 2018-04-16 NOTE — Progress Notes (Signed)
  Speech Language Pathology Treatment: Cognitive-Linquistic  Patient Details Name: Shawn Mccann MRN: [id number] DOB: [birth year] Today's Date: 04/16/2018 Time: 4196-2229 SLP Time Calculation (min) (ACUTE ONLY): 16 min  Assessment / Plan / Recommendation Clinical Impression  Pt read a card from his coworkers with Min-Mod cues provided for reading at the sentence level. He needed Min cues for use of word-finding strategies in structured, divergent naming tasks and during spontaneous verbal output that was primarily at the phrase level. Recommend CIR-level f/u to maximize functional communication.   HPI HPI: Pt is a 63 y.o. male with medical history significant of HTN who presented with dysarthria and aphasia; he was found to have multiple infracts on evaluation at [facility] and was transferred to [facility]. 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       SLP Plan  Continue with current plan of care       Recommendations                   Follow up Recommendations: Inpatient Rehab SLP Visit Diagnosis: Aphasia (R47.01) Plan: Continue with current plan of care       GO                Shawn Mccann 04/16/2018, 11:30 AM  Nuala Alpha, M.A. [place] [facility] Environmental education officer [phone] Office [phone]

## 2018-04-16 NOTE — Plan of Care (Signed)
  Problem: Education: Goal: Will demonstrate proper wound care and an understanding of methods to prevent future damage Outcome: Progressing Goal: Knowledge of disease or condition will improve Outcome: Progressing Goal: Knowledge of the prescribed therapeutic regimen will improve Outcome: Progressing Goal: Individualized Educational Video(s) Outcome: Progressing   Problem: Activity: Goal: Risk for activity intolerance will decrease Outcome: Progressing   Problem: Respiratory: Goal: Respiratory status will improve Outcome: Progressing   Problem: Skin Integrity: Goal: Wound healing without signs and symptoms of infection Outcome: Progressing Goal: Risk for impaired skin integrity will decrease Outcome: Progressing   Problem: Urinary Elimination: Goal: Ability to achieve and maintain adequate renal perfusion and functioning will improve Outcome: Progressing

## 2018-04-16 NOTE — Progress Notes (Signed)
OT Cancellation Note  Patient Details Name: Shawn Mccann MRN: [id number] DOB: 02/06/1956   Cancelled Treatment:     Pt underwent resection of myxoma - MD Please indicate when pt medically cleared to resume PT/OT.  Thank you.  Lucille Passy, OTR/L [place] Pager [phone] Office [phone]   Lucille Passy M 04/16/2018, 6:21 AM

## 2018-04-16 NOTE — Consult Note (Addendum)
Physical Medicine and Rehabilitation Consult Reason for Consult:  Decreased functional mobility Referring Physician: [facility]   HPI: Shawn Mccann is a 63 y.o.right handed male with history of hypertension, hyperlipidemia . Per chart review patient lives alone independent prior to admission works driving a Forensic scientist. One level home 3 steps to entry.He has a sister in the area.Presented 04/09/2018 from [facility] with expressive aphasia and right side weakness. CT of the head and MRI showed CVA in multiple distributions along with echo showing a myxoma and the left atrium. Patient was transferred to [facility]. Echocardiogram showed a 2.2 x 1.3 cm pedunculated mass in LA attached to the I/A ES by a stock in the area of the fossa ovalis. Ejection fraction 65%. Troponin negative. CTA showed normal aortic arch and subclavian arteries no stenosis. He was started on aspirin. No heparin initiated due to risk of hemorrhagic conversion. EKG with no ST changes. Patient with cardiology consult as well as cardiothoracic surgery and underwent ultrasound-guided percutaneous arterial and venous access for cardiopulmonary bypass right miniature anterior thoracotomy 04/15/2018 per Dr. Roxy Manns. Noted benign-appearing left atrial mass.Patient with sternal precautions.Noted hospital course with bouts of bradycardia. Currently maintained on clear liquids and advance as tolerated. Therapy evaluations completed 25 20/20 recommendations of physical medicine rehabilitation consult.    Review of Systems  Constitutional: Negative for chills and fever.  HENT: Negative for hearing loss.   Eyes: Negative for blurred vision and double vision.  Respiratory: Negative for cough and shortness of breath.   Cardiovascular: Positive for leg swelling. Negative for chest pain and palpitations.  Gastrointestinal: Positive for constipation. Negative for nausea and vomiting.  Genitourinary: Negative for dysuria, flank pain  and hematuria.  Musculoskeletal: Positive for myalgias.  Skin: Negative for rash.  Neurological: Positive for speech change and focal weakness.  All other systems reviewed and are negative.  Past Medical History:  Diagnosis Date  . Atrial myxoma 04/08/2018  . CVA (cerebral vascular accident) ([place]) 04/08/2018  . Dyslipidemia   . Hypertension   . s/p minimally invasive resection of left atrial myxoma 04/15/2018   Past Surgical History:  Procedure Laterality Date  . LEFT HEART CATH AND CORONARY ANGIOGRAPHY N/A 04/11/2018   Procedure: LEFT HEART CATH AND CORONARY ANGIOGRAPHY;  Surgeon: Belva Crome, MD;  Location: [facility];  Service: Cardiovascular;  Laterality: N/A;  . NO PAST SURGERIES    . TEE WITHOUT CARDIOVERSION N/A 04/10/2018   Procedure: TRANSESOPHAGEAL ECHOCARDIOGRAM (TEE);  Surgeon: Sanda Klein, MD;  Location: [facility] ENDOSCOPY;  Service: Cardiovascular;  Laterality: N/A;   Family History  Problem Relation Age of Onset  . Lung cancer Mother 33  . CAD Father 67  . Hypertension Sister   . Hypertension Brother   . CVA Maternal Grandfather   . CVA Other    Social History:  reports that he has never smoked. He has never used smokeless tobacco. He reports current alcohol use. He reports that he does not use drugs. Allergies: No Known Allergies Medications Prior to Admission  Medication Sig Dispense Refill  . acetaminophen (TYLENOL) 325 MG tablet Take 650 mg by mouth every 6 (six) hours as needed for mild pain or headache.    . metoprolol (TOPROL-XL) 200 MG 24 hr tablet Take 200 mg by mouth daily.    . pravastatin (PRAVACHOL) 80 MG tablet Take 80 mg by mouth daily.      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 +2 for line management Transfers Overall transfer level: Needs assistance Equipment used: (eva) Transfers: Sit to/from Stand Sit to Stand: Mod assist  Lateral/Scoot Transfers: Total assist, +2 physical assistance, +2 safety/equipment General transfer comment: Moderate assist to power up with heavy posterior bias, good compliance with sternal precautions once cued Ambulation/Gait Ambulation/Gait assistance: Min assist Gait Distance (Feet): 240 Feet Assistive device: (eval cardiac walker) Gait Pattern/deviations: Step-through pattern, Decreased step length - right, Decreased step length - left, Decreased stride length, Staggering left, Staggering right, Drifts right/left, Narrow base of support General Gait Details: patient with some noted instability, heavy reliance on UE support via Eva walker, 3 standing rest breaks. Ambulated on 6 liters [place] Gait velocity: decreased    ADL: ADL Overall ADL's : Needs assistance/impaired Eating/Feeding: Set up, Sitting Grooming: Wash/dry hands, Wash/dry face, Oral care, Brushing hair, Sitting, Minimal assistance Upper Body Bathing: Moderate assistance, Sitting Lower Body Bathing: Maximal assistance, Sit to/from stand Upper Body Dressing : Moderate assistance, Sitting Lower Body Dressing: Maximal assistance, Sit to/from stand Lower Body Dressing Details (indicate cue type and reason): requires assist to pull  socks over feet due to inability to use Rt UE functionally due to precautions Toilet Transfer: Minimal assistance, Ambulation, RW Toileting- Clothing Manipulation and Hygiene: Total assistance, Sit to/from stand Functional mobility during ADLs: Moderate assistance, Rolling walker, +2 for safety/equipment General ADL Comments: Pt limited by Lt ankle pain when attempting to stand and by Rt wrist precautions   Cognition: Cognition Overall Cognitive Status: Difficult to assess Orientation Level: Oriented X4 Cognition Arousal/Alertness: Awake/alert Behavior During Therapy: WFL for tasks assessed/performed Overall Cognitive Status: Difficult to assess Area of Impairment: Following commands, Problem solving Memory: Decreased short-term memory Following Commands: Follows one step commands consistently, Follows multi-step commands with increased time Safety/Judgement: Decreased awareness of deficits Problem Solving: Difficulty sequencing, Requires verbal cues, Requires tactile cues Difficult to assess due to: Impaired communication  Blood pressure 121/78, pulse 68, temperature 98.6 F (37 C), temperature source Oral, resp. rate 20, height 5\' 3"  (1.6 m), weight 89.2 kg, SpO2 99 %. Physical Exam  Vitals reviewed. HENT:  Head: Normocephalic.  Eyes: EOM are normal.  Neck: Normal range of motion. Neck supple. No thyromegaly present.  Cardiovascular:  Cardiac rate controlled  Respiratory: Effort normal and breath sounds normal. No respiratory distress.  GI: Bowel sounds are normal. He exhibits no distension.  Neurological: He is alert.  Patient does have an element of expressive receptive aphasia. He does follow simple commands. He was able to provide his name and age.  Thoracotomy site with dry dressing, 2 chest tube sites without leakage, pacer sites noted, pain pump noted Motor strength is 4+ in the right deltoid bicep tricep grip hip flexor knee extensor ankle dorsiflexion 5/5 in left  deltoid by stress of grip hip flexion extension ankle dorsiflexion Speech has difficulty with word finding he  has single word responses yes nose appears accurate he is able to say big toe Sensation to light touch is intact in both lower extremities Finger-nose-finger is intact bilaterally in the upper extremities Results for orders placed or performed during the hospital encounter of 04/09/18 (from the past 24 hour(s))  I-STAT 4, (NA,K, GLUC, HGB,HCT)     Status: Abnormal   Collection Time: 04/15/18 12:00 PM  Result Value Ref Range   Sodium 140 135 - 145 mmol/L   Potassium 3.9 3.5 - 5.1 mmol/L   Glucose, Bld 124 (H) 70 - 99 mg/dL   HCT 26.0 (L) 39.0 - 52.0 %   Hemoglobin 8.8 (L) 13.0 - 17.0 g/dL  CBC     Status: Abnormal   Collection Time: 04/15/18  1:42 PM  Result Value Ref Range   WBC 10.7 (H) 4.0 - 10.5 K/uL   RBC 3.61 (L) 4.22 - 5.81 MIL/uL   Hemoglobin 10.8 (L) 13.0 - 17.0 g/dL   HCT 32.0 (L) 39.0 - 52.0 %   MCV 88.6 80.0 - 100.0 fL   MCH 29.9 26.0 - 34.0 pg   MCHC 33.8 30.0 - 36.0 g/dL   RDW 12.1 11.5 - 15.5 %   Platelets 143 (L) 150 - 400 K/uL   nRBC 0.0 0.0 - 0.2 %  Protime-INR     Status: Abnormal   Collection Time: 04/15/18  1:42 PM  Result Value Ref Range   Prothrombin Time 17.1 (H) 11.4 - 15.2 seconds   INR 1.41   APTT     Status: None   Collection Time: 04/15/18  1:42 PM  Result Value Ref Range   aPTT 33 24 - 36 seconds  I-STAT 7, (LYTES, BLD GAS, ICA, H+H)     Status: Abnormal   Collection Time: 04/15/18  1:42 PM  Result Value Ref Range   pH, Arterial 7.354 7.350 - 7.450   pCO2 arterial 38.8 32.0 - 48.0 mmHg   pO2, Arterial 97.0 83.0 - 108.0 mmHg   Bicarbonate 21.8 20.0 - 28.0 mmol/L   TCO2 23 22 - 32 mmol/L   O2 Saturation 97.0 %   Acid-base deficit 4.0 (H) 0.0 - 2.0 mmol/L   Sodium 140 135 - 145 mmol/L   Potassium 3.9 3.5 - 5.1 mmol/L   Calcium, Ion 1.13 (L) 1.15 - 1.40 mmol/L   HCT 30.0 (L) 39.0 - 52.0 %   Hemoglobin 10.2 (L) 13.0 - 17.0 g/dL    Patient temperature 36.1 C    Sample type CARDIOPULMONARY BYPASS   Glucose, capillary     Status: Abnormal   Collection Time: 04/15/18  1:45 PM  Result Value Ref Range   Glucose-Capillary 108 (H) 70 - 99 mg/dL  Glucose, capillary     Status: None   Collection Time: 04/15/18  2:54 PM  Result Value Ref Range   Glucose-Capillary 87 70 - 99 mg/dL  Glucose, capillary     Status: Abnormal   Collection Time: 04/15/18  3:56 PM  Result Value Ref Range   Glucose-Capillary 107 (H) 70 - 99 mg/dL  Glucose, capillary     Status: Abnormal   Collection Time: 04/15/18  5:06 PM  Result Value Ref Range   Glucose-Capillary 103 (H) 70 - 99 mg/dL  Glucose, capillary     Status: Abnormal   Collection Time: 04/15/18 10:17 PM  Result Value Ref Range   Glucose-Capillary 139 (H) 70 - 99 mg/dL  CBC     Status: Abnormal   Collection Time: 04/15/18 11:14  PM  Result Value Ref Range   WBC 9.6 4.0 - 10.5 K/uL   RBC 3.49 (L) 4.22 - 5.81 MIL/uL   Hemoglobin 10.1 (L) 13.0 - 17.0 g/dL   HCT 30.8 (L) 39.0 - 52.0 %   MCV 88.3 80.0 - 100.0 fL   MCH 28.9 26.0 - 34.0 pg   MCHC 32.8 30.0 - 36.0 g/dL   RDW 12.4 11.5 - 15.5 %   Platelets 192 150 - 400 K/uL   nRBC 0.0 0.0 - 0.2 %  Magnesium     Status: Abnormal   Collection Time: 04/15/18 11:14 PM  Result Value Ref Range   Magnesium 2.6 (H) 1.7 - 2.4 mg/dL  Creatinine, serum     Status: None   Collection Time: 04/15/18 11:14 PM  Result Value Ref Range   Creatinine, Ser 0.88 0.61 - 1.24 mg/dL   GFR calc non Af Amer >60 >60 mL/min   GFR calc Af Amer >60 >60 mL/min  Glucose, capillary     Status: Abnormal   Collection Time: 04/16/18  1:17 AM  Result Value Ref Range   Glucose-Capillary 121 (H) 70 - 99 mg/dL  Basic metabolic panel     Status: Abnormal   Collection Time: 04/16/18  4:11 AM  Result Value Ref Range   Sodium 137 135 - 145 mmol/L   Potassium 4.2 3.5 - 5.1 mmol/L   Chloride 110 98 - 111 mmol/L   CO2 22 22 - 32 mmol/L   Glucose, Bld 126 (H) 70 - 99  mg/dL   BUN 13 8 - 23 mg/dL   Creatinine, Ser 0.96 0.61 - 1.24 mg/dL   Calcium 7.6 (L) 8.9 - 10.3 mg/dL   GFR calc non Af Amer >60 >60 mL/min   GFR calc Af Amer >60 >60 mL/min   Anion gap 5 5 - 15  Magnesium     Status: Abnormal   Collection Time: 04/16/18  4:11 AM  Result Value Ref Range   Magnesium 2.6 (H) 1.7 - 2.4 mg/dL  Glucose, capillary     Status: Abnormal   Collection Time: 04/16/18  5:12 AM  Result Value Ref Range   Glucose-Capillary 106 (H) 70 - 99 mg/dL  CBC     Status: Abnormal   Collection Time: 04/16/18  5:13 AM  Result Value Ref Range   WBC 12.5 (H) 4.0 - 10.5 K/uL   RBC 3.43 (L) 4.22 - 5.81 MIL/uL   Hemoglobin 9.8 (L) 13.0 - 17.0 g/dL   HCT 30.5 (L) 39.0 - 52.0 %   MCV 88.9 80.0 - 100.0 fL   MCH 28.6 26.0 - 34.0 pg   MCHC 32.1 30.0 - 36.0 g/dL   RDW 12.4 11.5 - 15.5 %   Platelets 217 150 - 400 K/uL   nRBC 0.0 0.0 - 0.2 %  Glucose, capillary     Status: Abnormal   Collection Time: 04/16/18  8:10 AM  Result Value Ref Range   Glucose-Capillary 118 (H) 70 - 99 mg/dL   Dg Chest Port 1 View  Result Date: 04/16/2018 CLINICAL DATA:  Chest tube present EXAM: PORTABLE CHEST 1 VIEW COMPARISON:  Yesterday FINDINGS: Swan-Ganz catheter has been removed. Left IJ sheath is in stable position. Right-sided chest tubes in stable position. Lower lung volumes with atelectasis at the right more than left base. No pneumothorax. No pleural fluid or edema. Stable postoperative heart size. IMPRESSION: 1. Remaining hardware in stable position. 2. Atelectasis with lower lung volumes. 3. No evident pneumothorax. Electronically Signed  By: Monte Fantasia M.D.   On: 04/16/2018 08:43   Dg Chest Port 1 View  Result Date: 04/15/2018 CLINICAL DATA:  Post op excision of LT atrial myxoma, transesophageal echocardiogram. Chest tube present. EXAM: PORTABLE CHEST 1 VIEW COMPARISON:  04/12/2018 and 04/11/2018 FINDINGS: Patient has a LEFT IJ Swan-Ganz catheter, tip overlying the pulmonary outflow  tract. A RIGHT sided chest tube is in place, tip overlying the lung apex. Patchy opacities are identified at the RIGHT lung base. There is minimal subsegmental atelectasis in the LEFT mid lung zone. No pulmonary edema. IMPRESSION: Postoperative chest. RIGHT LOWER lobe opacity consistent with atelectasis. Electronically Signed   By: Nolon Nations M.D.   On: 04/15/2018 13:57     Assessment/Plan: Diagnosis: Left MCA distribution infarct with aphasia, right hemiparesis 1. Does the need for close, 24 hr/day medical supervision in concert with the patient's rehab needs make it unreasonable for this patient to be served in a less intensive setting? Yes 2. Co-Morbidities requiring supervision/potential complications: Minithoracotomy for atrial myxoma removal on 04/14/2018, hypertension, hyperlipidemia, L5-S1 spondylolisthesis 3. Due to bladder management, bowel management, safety, skin/wound care, disease management, medication administration, pain management and patient education, does the patient require 24 hr/day rehab nursing? Yes 4. Does the patient require coordinated care of a physician, rehab nurse, PT (1-2 hrs/day, 5 days/week), OT (1-2 hrs/day, 5 days/week) and SLP (.5-1 hrs/day, 5 days/week) to address physical and functional deficits in the context of the above medical diagnosis(es)? Yes Addressing deficits in the following areas: balance, endurance, locomotion, strength, transferring, bowel/bladder control, bathing, dressing, feeding, grooming, toileting, cognition and psychosocial support 5. Can the patient actively participate in an intensive therapy program of at least 3 hrs of therapy per day at least 5 days per week? Yes 6. The potential for patient to make measurable gains while on inpatient rehab is excellent 7. Anticipated functional outcomes upon discharge from inpatient rehab are supervision  with PT, supervision with OT, supervision with SLP. 8. Estimated rehab length of stay to reach  the above functional goals is: 7d 9. Anticipated D/C setting: Home 10. Anticipated post D/C treatments: K[place] therapy 11. Overall Rehab/Functional Prognosis: excellent  RECOMMENDATIONS: This patient's condition is appropriate for continued rehabilitative care in the following setting: CIR Patient has agreed to participate in recommended program. Potentially Note that insurance prior authorization may be required for reimbursement for recommended care.  Comment: We will need to wait until pacer wires are removed, pain pump is removed and chest tubes are removed, according to CCU nurse most lines should be coming out in 24 hours  "I have personally performed a face to face diagnostic evaluation of this patient.  Additionally, I have reviewed and concur with the physician assistant's documentation above." Charlett Blake M.D. [facility] FAAPM&R (Sports Med, Neuromuscular Med) Diplomate Am Board of Electrodiagnostic Med  Cathlyn Parsons, PA-C 04/16/2018

## 2018-04-16 NOTE — Discharge Summary (Signed)
Physician Discharge Summary       [address] [place]       [place] [address]             [phone]    Patient ID: Shawn Mccann MRN: [id number] DOB/AGE: 1955/11/21 63 y.o.  Admit date: 04/09/2018 Discharge date: 04/23/2018  Admission Diagnoses: Left atrial myxoma  Discharge Diagnoses:  1. 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 S/p CVA (cerebral vascular accident) ([place]) 3. Post op atrial fibrillation then a flutter (2:1) 4. ABL anemia 5. History of Hypertension 6. History of Dyslipidemia   Procedure (s):   Minimally-Invasive Resection Left Atrial Myxoma             Ultrasound-guided percutaneous arterial and venous access for cardiopulmonary bypass             Right miniature anterior thoracotomy             Autologous pericardial patch closure of interatrial septum by Dr. Roxy Manns on 04/15/2018.  History of Presenting Illness: This is a 63 year old male with a past medical history of hypertension, dyslipidemia who initially presented to [facility] with complaints of difficulty speaking and expressing what he wanted to say. 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 He went home and after several hours drove himself to his sister's house. She noticed he was having difficulty speaking and expressing his words  (expressive and receptive aphasia). It is questionable as to if he had mild right sided weakness. He was taken to Urgent Care who transferred him to [facility] for further evaluation. CT and MRI of the head were done. CT showed evolving hypodensity involving the supra ganglionic left frontal cortex, compatible with evolving acute left MCA territory infarct. MRI showed bilateral areas of restricted diffusion in disparate vascular territories most consistent with multiple acute infarcts secondary to shower of emboli. The dominant abnormality remains LEFT frontal opercular cortex, without visible hemorrhage. CTA  of head and neck showed no large vessel occlusion. Echo then showed LVEF 60-65%, 2.2 x 1.3cm pedunculated mass in LA attached to the IAS by a stalk in the area of the fossa ovalis c/w left atrial myxoma. He was started on a statin and aspirin. Dr. Roxy Manns has been consulted for further evaluation and treatment of the left atrial myxoma. At the time of exam, patient is tachycardic. He has expressive and receptive aphasia. Both of his sisters are at his bedside.   Patient is a 63 year old male with history of hypertension and hyperlipidemia who has otherwise been healthy until he presented with acute onset expressive aphasia yesterday.  He was initially evaluated at [facility] in [place] where CT of the head revealed signs of an acute stroke.  CTA of the head and neck and MRI of the head confirmed the presence of multiple infarcts consistent with shower of embolization to both sides of the brain with a dominant lesion in the left frontal opercular cortex without any associated signs of hemorrhage.  An echocardiogram was performed suggesting the presence of left atrial myxoma.  Dr. Roxy Manns personally discussed the nature of the patient's clinical problem and the results of his TEE and MRI of the brain at length with the patient and his 2 sisters at the bedside this evening.  The indications, risk, and potential benefits of surgery have been discussed.  Expectations for the patient's postoperative convalescence have been discussed.  All of their questions have been answered. Patient agreed  to proceed with surgery. Pre operative carotid duplex US showed no significant internal carotid artery stenosis bilaterally. He underwent a minimally invasive resection of LA myxoma on 02/04.  Brief Hospital Course:  The patient was extubated the evening of surgery without difficulty. He remained afebrile and hemodynamically stable. [name], a line, and foley were removed early in the post operative course. Chest tubes  remained for several days then were removed on 02/07. Lopressor was started and titrated accordingly. He was hypertensive so he was started on Amlodipine 5 mg daily. He was volume over loaded and diuresed. He had ABL anemia. He did not require a post op transfusion. Last H and H was 10.9 . He was weaned off the insulin drip.  Once he was tolerating a diet, home diabetic medicines were restarted.  The patient's glucose remained well controlled.The patient's HGA1C pre op was 5.5. The patient was felt surgically stable for transfer from the ICU to [facility] for further convalescence on 02/06. He did go into a fib with RVR and was put on Amiodarone. He was given Lopressor 5 mg IV 02/07 in the am to help with rate control. He then went into a flutter 2:1. His rate is still not well controlled as of 02/08, despite Amiodarone drip, Lopressor 50 mg tid and Coumadin.  It was felt cardioversion would be indicated and EP consult was obtained.  He was taken off coumadin and placed on Eliquis.  He underwent cardioversion on 04/22/2018 with successful conversion to NSR.  He remains in NSR prior to discharge.  Daily PT and INR were checked. He continues to progress with cardiac rehab. He was ambulating on room air. He has been tolerating a diet and has had a bowel movement. Epicardial pacing wires were removed on 02/06. Chest tube sutures will be removed in the office after discharge. The patient is felt surgically stable for discharge today.   Latest Vital Signs: Blood pressure (!) 157/98, pulse 96, temperature 98.5 F (36.9 C), temperature source Oral, resp. rate 20, height 5\' 3"  (1.6 m), weight 82.2 kg, SpO2 99 %.  Physical Exam: General appearance: alert, cooperative and no distress Heart: regular rate and rhythm Lungs: clear to auscultation bilaterally Abdomen: soft, non-tender; bowel sounds normal; no masses,  no organomegaly Extremities: edema trace Wound: clean and dry  Discharge Condition: Stable and discharged  to home.  Recent laboratory studies:  Lab Results  Component Value Date   WBC 12.8 (H) 04/18/2018   HGB 10.9 (L) 04/18/2018   HCT 33.4 (L) 04/18/2018   MCV 87.9 04/18/2018   PLT 238 04/18/2018   Lab Results  Component Value Date   NA 137 04/23/2018   K 4.5 04/23/2018   CL 102 04/23/2018   CO2 25 04/23/2018   CREATININE 1.33 (H) 04/23/2018   GLUCOSE 108 (H) 04/23/2018      Diagnostic Studies: Dg Chest 2 View  Result Date: 04/21/2018 CLINICAL DATA:  Follow-up atelectasis. EXAM: CHEST - 2 VIEW COMPARISON:  February 2020 FINDINGS: Opacity in the right base, previously described as atelectasis, is stable. There is probably a small effusion on the right, a little larger in the interval based on the lateral view. No pneumothorax. The left lung is clear. The cardiomediastinal silhouette is unchanged. No other interval changes identified. IMPRESSION: 1. Stable opacity in the right base, previously described as atelectasis. 2. Probable small effusion, a little larger in the interval based on the lateral view. 3. No other change. Electronically Signed   By: Shanon Brow  Mee Hives M.D   On: 04/21/2018 08:14   Dg Chest 2 View  Result Date: 04/19/2018 CLINICAL DATA:  Atelectasis EXAM: CHEST - 2 VIEW COMPARISON:  Two days ago FINDINGS: Unchanged streaky density in the right chest with volume loss. No evidence of pneumothorax. No pulmonary edema. Trace right pleural effusion. Stable postoperative heart size. IMPRESSION: 1. No pneumothorax after chest tube removal. 2. Asymmetric right-sided atelectasis. Electronically Signed   By: Monte Fantasia M.D.   On: 04/19/2018 09:24   Dg Chest 2 View  Result Date: 04/11/2018 CLINICAL DATA:  Chest pain today EXAM: CHEST - 2 VIEW COMPARISON:  None. FINDINGS: Shallow inspiration with elevation of left hemidiaphragm. Heart size and pulmonary vascularity are normal for technique. Probable linear atelectasis in the lung bases. No airspace disease or consolidation. No  blunting of costophrenic angles. No pneumothorax. Mediastinal contours appear intact. IMPRESSION: Shallow inspiration with atelectasis in the lung bases. Electronically Signed   By: Lucienne Capers M.D.   On: 04/11/2018 22:24   Dg Chest Port 1 View  Result Date: 04/17/2018 CLINICAL DATA:  Pleural effusion EXAM: PORTABLE CHEST 1 VIEW COMPARISON:  04/16/2018 FINDINGS: 2 right chest tubes remain in place, 1 in the apex and 1 in the medial base. No pneumothorax. Elevated right hemidiaphragm with right lower lobe consolidation unchanged. No significant right pleural effusion. Left lung is clear. Left jugular sheath in the left innominate vein. IMPRESSION: Two chest tubes remain on the right. Right lower lobe atelectasis without significant pleural effusion. No pneumothorax. Electronically Signed   By: Franchot Gallo M.D.   On: 04/17/2018 07:44   Dg Chest Port 1 View  Result Date: 04/16/2018 CLINICAL DATA:  Chest tube present EXAM: PORTABLE CHEST 1 VIEW COMPARISON:  Yesterday FINDINGS: Swan-Ganz catheter has been removed. Left IJ sheath is in stable position. Right-sided chest tubes in stable position. Lower lung volumes with atelectasis at the right more than left base. No pneumothorax. No pleural fluid or edema. Stable postoperative heart size. IMPRESSION: 1. Remaining hardware in stable position. 2. Atelectasis with lower lung volumes. 3. No evident pneumothorax. Electronically Signed   By: Monte Fantasia M.D.   On: 04/16/2018 08:43   Dg Chest Port 1 View  Result Date: 04/15/2018 CLINICAL DATA:  Post op excision of LT atrial myxoma, transesophageal echocardiogram. Chest tube present. EXAM: PORTABLE CHEST 1 VIEW COMPARISON:  04/12/2018 and 04/11/2018 FINDINGS: Patient has a LEFT IJ Swan-Ganz catheter, tip overlying the pulmonary outflow tract. A RIGHT sided chest tube is in place, tip overlying the lung apex. Patchy opacities are identified at the RIGHT lung base. There is minimal subsegmental atelectasis in  the LEFT mid lung zone. No pulmonary edema. IMPRESSION: Postoperative chest. RIGHT LOWER lobe opacity consistent with atelectasis. Electronically Signed   By: Nolon Nations M.D.   On: 04/15/2018 13:57   Ct Angio Chest/abd/pel For Dissection W And/or W/wo  Result Date: 04/12/2018 CLINICAL DATA:  Atrial myxoma.  Stroke. EXAM: CT ANGIOGRAPHY CHEST, ABDOMEN AND PELVIS TECHNIQUE: Multidetector CT imaging through the chest, abdomen and pelvis was performed using the standard protocol during bolus administration of intravenous contrast. Multiplanar reconstructed images and MIPs were obtained and reviewed to evaluate the vascular anatomy. CONTRAST:  122mL ISOVUE-370 IOPAMIDOL (ISOVUE-370) INJECTION 76% COMPARISON:  None. FINDINGS: CTA CHEST FINDINGS Cardiovascular: There is no evidence of aortic dissection, acute intramural hematoma, or aneurysm. Maximal diameter of the ascending aorta is 3.3 cm. Great vessels are patent. No significant coronary artery calcifications. There is a 2.0 cm mass within  the left atrium abutting the anterior atrial wall consistent with the patient's known atrial myxoma. No obvious acute pulmonary thromboembolism. Mediastinum/Nodes: There is no evidence of abnormal mediastinal adenopathy or pericardial effusion. Visualized thyroid is unremarkable. Esophagus is within normal limits. Lungs/Pleura: There are bilateral 2 mm pulmonary nodules in the right upper lobe on image 27, left upper lobe on image 39, and left lower lobe on image 56. No pneumothorax or pleural effusion. Minimal subsegmental atelectasis at the lung bases. Musculoskeletal: T6 hemangioma is noted. Irregularity of the inferior endplate is likely related to degenerative change. No vertebral compression deformity. Review of the MIP images confirms the above findings. CTA ABDOMEN AND PELVIS FINDINGS VASCULAR Aorta: Nonaneurysmal and patent. There is no evidence of dissection. Celiac: Patent.  Branch vessels are patent. SMA: SMA is  patent. Renals: A single right and 3 left renal arteries are patent. IMA: Patent. Inflow: Bilateral common, internal, and external iliac arteries are patent. There is no evidence of dissection. Review of the MIP images confirms the above findings. NON-VASCULAR Hepatobiliary: 4.8 cm cyst in the left lobe of the liver has a benign appearance. Other smaller hypodensities are also likely benign cysts. Gallbladder is unremarkable. Diffuse hepatic steatosis. Pancreas: Unremarkable Spleen: Unremarkable Adrenals/Urinary Tract: Adrenal glands are within normal limits. There are tiny calculi in the upper and lower poles of both kidneys. Bladder is within normal limits. Stomach/Bowel: No obvious mass in the colon. No evidence of small-bowel obstruction. Stomach is decompressed. Prominence of the wall of the rectum and distal sigmoid colon is likely related to decompression. Lymphatic: No abnormal retroperitoneal adenopathy. Reproductive: Prostate is within normal limits. Other: No free fluid. Musculoskeletal: L5 pars defects.  Grade 1 L5-S1 spondylolisthesis. Review of the MIP images confirms the above findings. IMPRESSION: Vascular: No evidence of aortic dissection or acute aortic vascular pathology. Nonvascular: 2.0 cm mass in the left atrium consistent with the patient's known left atrial myxoma. Small bilateral pulmonary nodules measuring 2 mm. No follow-up needed if patient is low-risk (and has no known or suspected primary neoplasm). Non-contrast chest CT can be considered in 12 months if patient is high-risk. This recommendation follows the consensus statement: Guidelines for Management of Incidental Pulmonary Nodules Detected on CT Images: From the Fleischner Society 2017; Radiology 2017; 284:228-243. Bilateral nephrolithiasis. L5-S1 grade 1 spondylolisthesis. Electronically Signed   By: Marybelle Killings M.D.   On: 04/12/2018 14:24   Vas US Doppler Pre Cabg  Result Date: 04/11/2018 PREOPERATIVE VASCULAR EVALUATION   Indications:      Pre CABG evaluation. Limitations:      Tachycardiac Comparison Study: No comparison study available Performing Technologist: Rudell Cobb RDMS, RVT  Examination Guidelines: A complete evaluation includes B-mode imaging, spectral Doppler, color Doppler, and power Doppler as needed of all accessible portions of each vessel. Bilateral testing is considered an integral part of a complete examination. Limited examinations for reoccurring indications may be performed as noted.  Right Carotid Findings: +----------+--------+--------+--------+--------+--------+           PSV cm/sEDV cm/sStenosisDescribeComments +----------+--------+--------+--------+--------+--------+ CCA Prox  130     25                               +----------+--------+--------+--------+--------+--------+ CCA Distal99      28                               +----------+--------+--------+--------+--------+--------+ ICA Prox  45      14      1-39%                    +----------+--------+--------+--------+--------+--------+ ICA Distal67      22                               +----------+--------+--------+--------+--------+--------+ ECA       122     16                               +----------+--------+--------+--------+--------+--------+ Portions of this table do not appear on this page. +----------+--------+-------+--------+------------+           PSV cm/sEDV cmsDescribeArm Pressure +----------+--------+-------+--------+------------+ Subclavian105                                 +----------+--------+-------+--------+------------+ +---------+--------+--+--------+--+---------+ VertebralPSV cm/s58EDV cm/s18Antegrade +---------+--------+--+--------+--+---------+ Left Carotid Findings: +----------+--------+--------+--------+--------+--------+           PSV cm/sEDV cm/sStenosisDescribeComments +----------+--------+--------+--------+--------+--------+ CCA Prox  95      16                                +----------+--------+--------+--------+--------+--------+ CCA Distal86      23                               +----------+--------+--------+--------+--------+--------+ ICA Prox  70      26      1-39%                    +----------+--------+--------+--------+--------+--------+ ICA Mid   73      26                               +----------+--------+--------+--------+--------+--------+ ICA Distal73      27                               +----------+--------+--------+--------+--------+--------+ ECA       125     27                               +----------+--------+--------+--------+--------+--------+ +----------+--------+--------+--------+------------+ SubclavianPSV cm/sEDV cm/sDescribeArm Pressure +----------+--------+--------+--------+------------+           105                     83           +----------+--------+--------+--------+------------+ +---------+--------+--+--------+--+---------+ VertebralPSV cm/s50EDV cm/s16Antegrade +---------+--------+--+--------+--+---------+  ABI Findings: +--------+------------------+-----+---------+-----------------------+ Right   Rt Pressure (mmHg)IndexWaveform Comment                 +--------+------------------+-----+---------+-----------------------+ Brachial                       triphasicCatheterization on site +--------+------------------+-----+---------+-----------------------+ PTA     205               2.47 triphasic                        +--------+------------------+-----+---------+-----------------------+ DP  199               2.40 triphasic                        +--------+------------------+-----+---------+-----------------------+ +--------+------------------+-----+---------+-------+ Left    Lt Pressure (mmHg)IndexWaveform Comment +--------+------------------+-----+---------+-------+ [id number]                     triphasic         +--------+------------------+-----+---------+-------+ PTA     192               2.31 triphasic        +--------+------------------+-----+---------+-------+ DP      182               2.19 triphasic        +--------+------------------+-----+---------+-------+ +-------+---------------+----------------+ ABI/TBIToday's ABI/TBIPrevious ABI/TBI +-------+---------------+----------------+ Right  2.47                            +-------+---------------+----------------+ Left   2.31                            +-------+---------------+----------------+  Right Doppler Findings: +--------+--------+-----+---------+-----------------------+ Site    PressureIndexDoppler  Comments                +--------+--------+-----+---------+-----------------------+ Brachial             triphasicCatheterization on site +--------+--------+-----+---------+-----------------------+ Radial               triphasic                        +--------+--------+-----+---------+-----------------------+ Ulnar                biphasic                         +--------+--------+-----+---------+-----------------------+  Left Doppler Findings: +--------+--------+-----+---------+--------+ Site    PressureIndexDoppler  Comments +--------+--------+-----+---------+--------+ [id number]           triphasic         +--------+--------+-----+---------+--------+ Radial               triphasic         +--------+--------+-----+---------+--------+ Ulnar                triphasic         +--------+--------+-----+---------+--------+  Summary: Right Carotid: Velocities in the right ICA are consistent with a 1-39% stenosis. Left Carotid: Velocities in the left ICA are consistent with a 1-39% stenosis. Vertebrals: Bilateral vertebral arteries demonstrate antegrade flow. Right ABI: Resting right ankle-brachial index indicates noncompressible right lower extremity arteries. Left ABI: Resting left ankle-brachial  index indicates noncompressible left lower extremity arteries. Right Upper Extremity: Doppler waveforms remain within normal limits with right radial compression. Doppler waveform obliterate with right ulnar compression. Left Upper Extremity: Doppler waveforms remain within normal limits with left radial compression. Doppler waveform obliterate with left ulnar compression.  Electronically signed by Curt Jews MD on 04/11/2018 at 4:52:49 PM.    Final        Discharge Instructions    AMB Referral to [facility]   Complete by:  As directed    Reason for consult:  EMMI STROKE   Diagnoses of:  Stroke: Ischemic/TIA   Expected date of contact:  1-3 days (reserved for hospital discharges)   Amb Referral to Cardiac Rehabilitation  Complete by:  As directed    Will refer to CRP II [place]   Diagnosis:  Other   Ambulatory referral to Neurology   Complete by:  As directed    Follow up with stroke clinic NP (Jessica Vanschaick or Cecille Rubin, if both not available, consider Zachery Dauer, or [place]) at [facility] in about 4 weeks. Thanks.      Discharge Medications: Allergies as of 04/23/2018   No Known Allergies     Medication List    STOP taking these medications   metoprolol 200 MG 24 hr tablet Commonly known as:  TOPROL-XL     TAKE these medications   acetaminophen 325 MG tablet Commonly known as:  TYLENOL Take 650 mg by mouth every 6 (six) hours as needed for mild pain or headache.   amiodarone 200 MG tablet Commonly known as:  PACERONE Take 1 tablet (200 mg total) by mouth 2 (two) times daily.   amLODipine 5 MG tablet Commonly known as:  NORVASC Take 1 tablet (5 mg total) by mouth daily. Start taking on:  April 24, 2018   apixaban 5 MG Tabs tablet Commonly known as:  ELIQUIS Take 1 tablet (5 mg total) by mouth 2 (two) times daily.   metoprolol tartrate 50 MG tablet Commonly known as:  LOPRESSOR Take 1 tablet (50 mg total) by mouth 2 (two) times daily.     pravastatin 80 MG tablet Commonly known as:  PRAVACHOL Take 80 mg by mouth daily.   traMADol 50 MG tablet Commonly known as:  ULTRAM Take 1-2 tablets (50-100 mg total) by mouth every 4 (four) hours as needed for moderate pain.            Durable Medical Equipment  (From admission, onward)         Start     Ordered   04/23/18 1358  For home use only DME Walker rolling  Once    Question:  Patient needs a walker to treat with the following condition  Answer:  Physical deconditioning   04/23/18 1357         The patient has been discharged on:   1.Beta Blocker:  Yes [  x ]                              No   [   ]                              If No, reason:  2.Ace Inhibitor/ARB: Yes [   ]                                     No  [  X  ]                                     If No, reason: elevated creatinine  3.Statin:   Yes [  x ]                  No  [   ]                  If No, reason:  4.Ecasa:  Yes  [ x  ]  No   [   ]                  If No, reason:  Follow Up Appointments: Follow-up Information    [facility]. Schedule an appointment as soon as possible for a visit in 4 week(s).   Specialty:  Neurology Contact information: [address] [place] [phone]       Rexene Alberts, MD. Go on 05/05/2018.   Specialty:  Cardiothoracic Surgery Why:  PA/LAT CXR to be taken (at [facility] which is in the same building as Dr. Guy Sandifer office) on 02/24 at 2:00 pm;Appointment time is at 2:30 pm and is with physician assistant Contact information: [address] [place] [address] [phone]        Consuelo Pandy, PA-C. Go on 05/06/2018.   Specialties:  Cardiology, Radiology Why:  Appointment time is at 11:00 am  Contact information: [place] [address] [phone]        [facility] Office Follow up.    Specialty:  Cardiology Why:  Patient needs to have PT and INR (on Coumadin for a flutter) drawn 48 hours after discharge from [place] information: [address] [place] Follow up.   Why:  rolling walker arranged- to be delivered to room prior to discharge Contact information: [address] [place] [address] [phone]           Signed: Ellamae Sia 04/23/2018, 2:33 PM

## 2018-04-16 NOTE — Evaluation (Signed)
Occupational Therapy ReEvaluation Patient Details Name: Shawn Mccann MRN: [id number] DOB: 21-Jun-1955 Today's Date: 04/16/2018    History of Present Illness Pt is a 63 y/o male admitted secondary to expressive aphasia. 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 Pt is undergoing pre-op work up for CABG for resection of myxoma, s/p resection of myoxoma.  PMH including but not limited to hypertension, hyperlipidemia    Clinical Impression   Pt seen for re-eval post surgery for myoxoma. Pt readily willing to work with therapy. Ambulated in hall with 2 person assist for lines using eva walker. Pt requires min to total assist for ADL and is on 5-6 L 02. Pt with excellent rehab potential. Will follow acutely.    Follow Up Recommendations  CIR    Equipment Recommendations  3 in 1 bedside commode    Recommendations for Other Services       Precautions / Restrictions Precautions Precautions: Fall;Sternal Precaution Comments: watch HR, chest tube, pacing wires, and bulb analgesic Restrictions Weight Bearing Restrictions: Yes      Mobility Bed Mobility Overal bed mobility: Needs Assistance Bed Mobility: Sit to Supine       Sit to supine: Min assist   General bed mobility comments: Min assist for positioning and LE elevation to bed. +2 for line management  Transfers Overall transfer level: Needs assistance Equipment used: (eva) Transfers: Sit to/from Stand Sit to Stand: Mod assist         General transfer comment: Moderate assist to power up with heavy posterior bias, good compliance with sternal precautions once cued    Balance Overall balance assessment: Needs assistance Sitting-balance support: Feet supported;Bilateral upper extremity supported Sitting balance-Leahy Scale: Fair Sitting balance - Comments: able to bend to feet to doff socks    Standing balance support: Bilateral upper extremity supported;During  functional activity Standing balance-Leahy Scale: Poor Standing balance comment: reliance on UE support                           ADL either performed or assessed with clinical judgement   ADL Overall ADL's : Needs assistance/impaired Eating/Feeding: Set up;Sitting   Grooming: Wash/dry hands;Wash/dry face;Oral care;Brushing hair;Sitting;Minimal assistance   Upper Body Bathing: Moderate assistance;Sitting   Lower Body Bathing: Maximal assistance;Sit to/from stand   Upper Body Dressing : Moderate assistance;Sitting   Lower Body Dressing: Maximal assistance;Sit to/from stand   Toilet Transfer: Minimal assistance;Ambulation;RW   Toileting- Clothing Manipulation and Hygiene: Total assistance;Sit to/from stand       Functional mobility during ADLs: Moderate assistance;Rolling walker;+2 for safety/equipment       Vision Baseline Vision/History: No visual deficits       Perception     Praxis      Pertinent Vitals/Pain Pain Assessment: Faces Faces Pain Scale: Hurts even more Pain Location: chest when returning to supine Pain Descriptors / Indicators: Grimacing;Guarding Pain Intervention(s): Monitored during session     Hand Dominance Right   Extremity/Trunk Assessment Upper Extremity Assessment Upper Extremity Assessment: Generalized weakness;LUE deficits/detail LUE Deficits / Details: wrists and forearm immobilized due lines LUE: Unable to fully assess due to immobilization   Lower Extremity Assessment Lower Extremity Assessment: Defer to PT evaluation   Cervical / Trunk Assessment Cervical / Trunk Assessment: Normal   Communication Communication Communication: Expressive difficulties   Cognition Arousal/Alertness: Awake/alert Behavior During Therapy: WFL for tasks assessed/performed Overall Cognitive Status: Difficult to assess Area of Impairment: Following  commands;Problem solving                     Memory: Decreased short-term  memory Following Commands: Follows one step commands consistently;Follows multi-step commands with increased time Safety/Judgement: Decreased awareness of deficits   Problem Solving: Difficulty sequencing;Requires verbal cues;Requires tactile cues     General Comments       Exercises     Shoulder Instructions      Home Living Family/patient expects to be discharged to:: Private residence Living Arrangements: Alone Available Help at Discharge: Family;Available PRN/intermittently Type of Home: House Home Access: Stairs to enter [employer] of Steps: 3 Entrance Stairs-Rails: Can reach both Home Layout: One level     Bathroom Shower/Tub: Occupational psychologist: Standard     Home Equipment: None   Additional Comments: Pt reports sisters live close by       Prior Functioning/Environment Level of Independence: Independent        Comments: 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 [employer]          OT Problem List: Decreased strength;Decreased activity tolerance;Impaired balance (sitting and/or standing);Decreased safety awareness;Decreased knowledge of use of DME or AE;Decreased knowledge of precautions;Cardiopulmonary status limiting activity;Impaired UE functional use;Pain      OT Treatment/Interventions: Self-care/ADL training;Neuromuscular education;DME and/or AE instruction;Therapeutic activities;Cognitive remediation/compensation;Visual/perceptual remediation/compensation;Patient/family education;Balance training    OT Goals(Current goals can be found in the care plan section) Acute Rehab OT Goals Patient Stated Goal: get stronger OT Goal Formulation: With patient Time For Goal Achievement: 04/30/18 Potential to Achieve Goals: Good  OT Frequency: Min 3X/week   Barriers to D/C:            Co-evaluation PT/OT/SLP Co-Evaluation/Treatment: Yes Reason for Co-Treatment: For patient/therapist safety PT goals addressed during session:  Mobility/safety with mobility OT goals addressed during session: ADL's and self-care;Strengthening/ROM      AM-PAC OT "6 Clicks" Daily Activity     Outcome Measure Help from another person eating meals?: A Little Help from another person taking care of personal grooming?: A Lot Help from another person toileting, which includes using toliet, bedpan, or urinal?: Total Help from another person bathing (including washing, rinsing, drying)?: A Lot Help from another person to put on and taking off regular upper body clothing?: A Lot Help from another person to put on and taking off regular lower body clothing?: A Lot 6 Click Score: 12   End of Session Equipment Utilized During Treatment: Gait belt(eva) Nurse Communication: Mobility status  Activity Tolerance: Patient tolerated treatment well Patient left: in bed;with call bell/phone within reach;with bed alarm set  OT Visit Diagnosis: Unsteadiness on feet (R26.81);Pain;Cognitive communication deficit (R41.841) Symptoms and signs involving cognitive functions: Cerebral infarction                Time: 7062-3762 OT Time Calculation (min): 28 min Charges:  OT General Charges $OT Visit: 1 Visit OT Evaluation $OT Re-eval: 1 Re-eval  Nestor Lewandowsky, OTR/L Acute Rehabilitation Services Pager: [phone] Office: [phone]  Malka So 04/16/2018, 9:45 AM

## 2018-04-17 ENCOUNTER — Inpatient Hospital Stay (HOSPITAL_COMMUNITY): Payer: BLUE CROSS/BLUE SHIELD

## 2018-04-17 LAB — BASIC METABOLIC PANEL
ANION GAP: 3 — AB (ref 5–15)
Anion gap: 12 (ref 5–15)
BUN: 13 mg/dL (ref 8–23)
BUN: 13 mg/dL (ref 8–23)
CALCIUM: 7.9 mg/dL — AB (ref 8.9–10.3)
CO2: 22 mmol/L (ref 22–32)
CO2: 27 mmol/L (ref 22–32)
Calcium: 7.7 mg/dL — ABNORMAL LOW (ref 8.9–10.3)
Chloride: 105 mmol/L (ref 98–111)
Chloride: 108 mmol/L (ref 98–111)
Creatinine, Ser: 0.94 mg/dL (ref 0.61–1.24)
Creatinine, Ser: 1.06 mg/dL (ref 0.61–1.24)
GFR calc Af Amer: >60 mL/min (ref >60)
GFR calc Af Amer: >60 mL/min (ref >60)
GFR calc non Af Amer: >60 mL/min (ref >60)
GFR calc non Af Amer: >60 mL/min (ref >60)
GLUCOSE: 111 mg/dL — AB (ref 70–99)
Glucose, Bld: 120 mg/dL — ABNORMAL HIGH (ref 70–99)
POTASSIUM: 3.7 mmol/L (ref 3.5–5.1)
Potassium: 3.6 mmol/L (ref 3.5–5.1)
Sodium: 138 mmol/L (ref 135–145)
Sodium: 139 mmol/L (ref 135–145)

## 2018-04-17 LAB — CBC
HCT: 30.6 % — ABNORMAL LOW (ref 39.0–52.0)
HCT: 33.1 % — ABNORMAL LOW (ref 39.0–52.0)
HEMOGLOBIN: 10.4 g/dL — AB (ref 13.0–17.0)
Hemoglobin: 10.7 g/dL — ABNORMAL LOW (ref 13.0–17.0)
MCH: 28.6 pg (ref 26.0–34.0)
MCH: 29.6 pg (ref 26.0–34.0)
MCHC: 32.3 g/dL (ref 30.0–36.0)
MCHC: 34 g/dL (ref 30.0–36.0)
MCV: 87.2 fL (ref 80.0–100.0)
MCV: 88.5 fL (ref 80.0–100.0)
Platelets: 226 10*3/uL (ref 150–400)
Platelets: 248 10*3/uL (ref 150–400)
RBC: 3.51 MIL/uL — AB (ref 4.22–5.81)
RBC: 3.74 MIL/uL — ABNORMAL LOW (ref 4.22–5.81)
RDW: 12.6 % (ref 11.5–15.5)
RDW: 12.7 % (ref 11.5–15.5)
WBC: 14.8 10*3/uL — ABNORMAL HIGH (ref 4.0–10.5)
WBC: 14.8 10*3/uL — ABNORMAL HIGH (ref 4.0–10.5)
nRBC: 0 % (ref 0.0–0.2)
nRBC: 0 % (ref 0.0–0.2)

## 2018-04-17 LAB — MAGNESIUM: Magnesium: 2.2 mg/dL (ref 1.7–2.4)

## 2018-04-17 LAB — GLUCOSE, CAPILLARY
Glucose-Capillary: 107 mg/dL — ABNORMAL HIGH (ref 70–99)
Glucose-Capillary: 102 mg/dL — ABNORMAL HIGH (ref 70–99)
Glucose-Capillary: 109 mg/dL — ABNORMAL HIGH (ref 70–99)

## 2018-04-17 MED ORDER — POTASSIUM CHLORIDE CRYS ER 20 MEQ PO TBCR
20.0000 meq | EXTENDED_RELEASE_TABLET | Freq: Once | ORAL | Status: AC
  Administered 2018-04-17: 20 meq via ORAL
  Filled 2018-04-17: qty 1

## 2018-04-17 MED ORDER — POTASSIUM CHLORIDE CRYS ER 20 MEQ PO TBCR
20.0000 meq | EXTENDED_RELEASE_TABLET | ORAL | Status: DC | PRN
  Administered 2018-04-17: 20 meq via ORAL
  Filled 2018-04-17: qty 1

## 2018-04-17 MED ORDER — POTASSIUM CHLORIDE 10 MEQ/50ML IV SOLN: 10.0000 meq | INTRAVENOUS | Status: DC

## 2018-04-17 MED ORDER — MOVING RIGHT ALONG BOOK
Freq: Once | Status: AC
  Administered 2018-04-17: 10:00:00
  Filled 2018-04-17: qty 1

## 2018-04-17 MED ORDER — FUROSEMIDE 40 MG PO TABS
40.0000 mg | ORAL_TABLET | Freq: Every day | ORAL | Status: DC
  Administered 2018-04-17 – 2018-04-20 (×4): 40 mg via ORAL
  Filled 2018-04-17 (×5): qty 1

## 2018-04-17 MED ORDER — POTASSIUM CHLORIDE 10 MEQ/100ML IV SOLN
10.0000 meq | INTRAVENOUS | Status: AC
  Administered 2018-04-17 (×4): 10 meq via INTRAVENOUS
  Filled 2018-04-17 (×4): qty 100

## 2018-04-17 NOTE — Progress Notes (Signed)
CARDIAC REHAB PHASE I   PRE:  Rate/Rhythm: 78 SR  BP:  Supine:   Sitting: 144/72  Standing:    SaO2: 95%RA  MODE:  Ambulation: 500 ft   POST:  Rate/Rhythm: 100 SR  BP:  Supine: 157/76  Sitting:   Standing:    SaO2: 95%RA 1340-1408 Pt very agreeable to walk. He walked 500 ft on RA with EVA and asst x 2. Pt does not grip handle with his right hand on EVA. Do not think he could walk this distance without upper support of the EVA. Back to bed after walk. Did well on RA. Has expressive aphasia and stated right hand weaker than left.    Graylon Good, RN BSN  04/17/2018 2:04 PM

## 2018-04-17 NOTE — Progress Notes (Signed)
  Speech Language Pathology Treatment: Cognitive-Linquistic  Patient Details Name: Shawn Mccann MRN: [id number] DOB: 10-Mar-1956 Today's Date: 04/17/2018 Time: 0109-3235 SLP Time Calculation (min) (ACUTE ONLY): 12 min  Assessment / Plan / Recommendation Clinical Impression  Pt was trying to read literature about precautions s/p cardiac surgery upon SLP arrival. SLP provided Min-Mod cues for reading at the sentence level. Mod cues were given for word-finding in spontaneous speech to increase length of utterance to phrase level. Continue to recommend CIR for intensive SLP follow up.   HPI HPI: Pt is a 63 y.o. male with medical history significant of HTN who presented with dysarthria and aphasia; he was found to have multiple infracts on evaluation at [facility] and was transferred to [facility]. 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       SLP Plan  Continue with current plan of care       Recommendations                   Follow up Recommendations: Inpatient Rehab SLP Visit Diagnosis: Aphasia (R47.01) Plan: Continue with current plan of care       Shawn Mccann 04/17/2018, 10:46 AM  Nuala Alpha, M.A. [place] Acute Environmental education officer [phone] Office [phone]

## 2018-04-17 NOTE — Progress Notes (Signed)
Pt arrived to 4e from 2h. Pt oriented to room and staff. Vitals obtained. Telemetry applied. CHG bath completed. @ chest tubes to right side; placed on suction, per order. Pt denies needs at this time. Will continue current plan of care.

## 2018-04-17 NOTE — Plan of Care (Signed)
  Problem: Education: Goal: Knowledge of General Education information will improve Description Including pain rating scale, medication(s)/side effects and non-pharmacologic comfort measures Outcome: Progressing   Problem: Education: Goal: Knowledge of disease or condition will improve Outcome: Progressing Goal: Knowledge of secondary prevention will improve Outcome: Progressing Goal: Knowledge of patient specific risk factors addressed and post discharge goals established will improve Outcome: Progressing Goal: Individualized Educational Video(s) Outcome: Progressing   Problem: Coping: Goal: Will verbalize positive feelings about self Outcome: Progressing   Problem: Self-Care: Goal: Ability to participate in self-care as condition permits will improve Outcome: Progressing Goal: Verbalization of feelings and concerns over difficulty with self-care will improve Outcome: Progressing Goal: Ability to communicate needs accurately will improve Outcome: Progressing   Problem: Nutrition: Goal: Risk of aspiration will decrease Outcome: Progressing   Problem: Ischemic Stroke/TIA Tissue Perfusion: Goal: Complications of ischemic stroke/TIA will be minimized Outcome: Progressing   Problem: Education: Goal: Will demonstrate proper wound care and an understanding of methods to prevent future damage Outcome: Progressing Goal: Knowledge of disease or condition will improve Outcome: Progressing Goal: Knowledge of the prescribed therapeutic regimen will improve Outcome: Progressing   Problem: Activity: Goal: Risk for activity intolerance will decrease Outcome: Progressing   Problem: Respiratory: Goal: Respiratory status will improve Outcome: Progressing   Problem: Skin Integrity: Goal: Wound healing without signs and symptoms of infection Outcome: Progressing Goal: Risk for impaired skin integrity will decrease Outcome: Progressing   Problem: Urinary Elimination: Goal:  Ability to achieve and maintain adequate renal perfusion and functioning will improve Outcome: Progressing   Problem: Cardiac: Goal: Will achieve and/or maintain hemodynamic stability Outcome: Progressing

## 2018-04-17 NOTE — Progress Notes (Addendum)
TCTS DAILY ICU PROGRESS NOTE                   [address]            [place] [address]          [phone]   2 Days Post-Op Procedure(s) (LRB): MINIMALLY INVASIVE EXCISION OF LEFT ATRIAL MYXOMA (N/A) TRANSESOPHAGEAL ECHOCARDIOGRAM (TEE) (N/A)  Total Length of Stay:  LOS: 8 days   Subjective: Patient sitting in chair this am and he has complaints of incisional pain. He still has expressive aphasia and dysarthria  Objective: Vital signs in last 24 hours: Temp:  [97.9 F (36.6 C)-98.6 F (37 C)] 98.1 F (36.7 C) (02/06 0400) Pulse Rate:  [68] 68 (02/05 1003) Cardiac Rhythm: Sinus bradycardia;Normal sinus rhythm (02/06 0300) Resp:  [13-24] 17 (02/06 0600) BP: (117-153)/(74-114) 140/76 (02/06 0600) SpO2:  [87 %-100 %] 92 % (02/06 0600) Arterial Line BP: (144-154)/(110-115) 154/115 (02/05 0900) Weight:  [89.2 kg] 89.2 kg (02/06 0500)  Filed Weights   04/12/18 0900 04/16/18 0500 04/17/18 0500  Weight: 85.7 kg 89.2 kg 89.2 kg    Weight change: 0 kg      Intake/Output from previous day: 02/05 0701 - 02/06 0700 In: 1180.1 [P.O.:940; I.V.:40; IV Piggyback:100.1] Out: 1655 [Urine:1365; Chest Tube:290]  Intake/Output this shift: No intake/output data recorded.  Current Meds: Scheduled Meds: . acetaminophen  1,000 mg Oral Q6H  . amLODipine  5 mg Oral Daily  . aspirin EC  325 mg Oral Daily  . bisacodyl  10 mg Oral Daily   Or  . bisacodyl  10 mg Rectal Daily  . Chlorhexidine Gluconate Cloth  6 each Topical Daily  . docusate sodium  200 mg Oral Daily  . enoxaparin (LOVENOX) injection  40 mg Subcutaneous Q24H  . [place] Blood Cardioplegia (KBC) lidocaine 2% Syringe (45mL)  13 mL Intracoronary Once  . [place] Blood Cardioplegia (KBC) mannitol 20% Syringe (76mL)  32 mL Intracoronary Once  . mouth rinse  15 mL Mouth Rinse BID  . metoprolol tartrate  12.5 mg Oral BID  . pantoprazole  40 mg Oral Daily  . sodium chloride flush  3 mL Intravenous Q12H     Continuous Infusions: . sodium chloride Stopped (04/16/18 1530)  . lactated ringers Stopped (04/16/18 1017)   PRN Meds:.metoprolol tartrate, morphine injection, ondansetron (ZOFRAN) IV, oxyCODONE, potassium chloride, sodium chloride flush, traMADol  General appearance: alert, cooperative and no distress Neurologic: intact. Has expressive aphasia and dysarthria but has had since admission Heart: RRR Lungs: Diminshed at right base Abdomen: Soft, non tender, bowel sounds present Extremities: Trace LE edema;right groing wound is clean and dry. No hematoma Wound: Dressing dry and intact  Lab Results: CBC: Recent Labs    04/16/18 2350 04/17/18 0608  WBC 14.8* 14.8*  HGB 10.4* 10.7*  HCT 30.6* 33.1*  PLT 226 248   BMET:  Recent Labs    04/16/18 2350 04/17/18 0608  NA 138 139  K 3.7 3.6  CL 108 105  CO2 27 22  GLUCOSE 120* 111*  BUN 13 13  CREATININE 0.94 1.06  CALCIUM 7.7* 7.9*    CMET: Lab Results  Component Value Date   WBC 14.8 (H) 04/17/2018   HGB 10.7 (L) 04/17/2018   HCT 33.1 (L) 04/17/2018   PLT 248 04/17/2018   GLUCOSE 111 (H) 04/17/2018   CHOL 168 04/10/2018   TRIG 72 04/10/2018   HDL 36 (L) 04/10/2018   LDLCALC 118 (H) 04/10/2018  ALT 19 04/11/2018   AST 25 04/11/2018   NA 139 04/17/2018   K 3.6 04/17/2018   CL 105 04/17/2018   CREATININE 1.06 04/17/2018   BUN 13 04/17/2018   CO2 22 04/17/2018   TSH 1.586 04/09/2018   INR 1.41 04/15/2018   HGBA1C 5.5 04/11/2018    PT/INR:  Recent Labs    04/15/18 1342  LABPROT 17.1*  INR 1.41   Radiology: No results found.   Assessment/Plan: S/P Procedure(s) (LRB): MINIMALLY INVASIVE EXCISION OF LEFT ATRIAL MYXOMA (N/A) TRANSESOPHAGEAL ECHOCARDIOGRAM (TEE) (N/A)   1. CV-Previous SB. SR mid 60's this am. On Lopressor 12.5 mg bid (with parameters) and Amlodipine 5 mg daily. BP better controlled since Amlodipine started yesterday.  2. Pulmonary-on 1 liter of oxygen via [place]. Chest tubes with 290 cc.  CXR this am appears stable ( right effusion and atelectasis, cardiomegaly, no pneumothorax). Chest tube output decreasing but tubes to remain for now. Encourage incentive spirometer and flutter. 3. Volume overload-will give Lasix 40 mg orally this am 4. Expected ABL anemia-H and H 10.7 and 33.1 5. Supplement potassium   Donielle Liston Alba PA-C 04/17/2018 7:10 AM    I have seen and examined the patient and agree with the assessment and plan as outlined.  Doing very well.  D/C tubes later today or tomorrow, depending on output.  Transfer 4E.  Will ask for consult from CIR team to consider possible d/c to CIR service in 1-2 days if patient continues to do well.    Rexene Alberts, MD 04/17/2018 8:17 AM

## 2018-04-17 NOTE — Progress Notes (Signed)
Progress Note  Patient Name: Shawn Mccann Date of Encounter: 04/17/2018  Primary Cardiologist: Fransico Him, MD    Subjective   No longer pacing BP improved   Inpatient Medications    Scheduled Meds: . acetaminophen  1,000 mg Oral Q6H  . amLODipine  5 mg Oral Daily  . aspirin EC  325 mg Oral Daily  . bisacodyl  10 mg Oral Daily   Or  . bisacodyl  10 mg Rectal Daily  . Chlorhexidine Gluconate Cloth  6 each Topical Daily  . docusate sodium  200 mg Oral Daily  . enoxaparin (LOVENOX) injection  40 mg Subcutaneous Q24H  . furosemide  40 mg Oral Daily  . [place] Blood Cardioplegia (KBC) lidocaine 2% Syringe (48mL)  13 mL Intracoronary Once  . [place] Blood Cardioplegia (KBC) mannitol 20% Syringe (67mL)  32 mL Intracoronary Once  . mouth rinse  15 mL Mouth Rinse BID  . metoprolol tartrate  12.5 mg Oral BID  . pantoprazole  40 mg Oral Daily  . potassium chloride  20 mEq Oral Once  . sodium chloride flush  3 mL Intravenous Q12H   Continuous Infusions: . sodium chloride Stopped (04/16/18 1530)  . lactated ringers Stopped (04/16/18 1017)   PRN Meds: metoprolol tartrate, morphine injection, ondansetron (ZOFRAN) IV, oxyCODONE, potassium chloride, sodium chloride flush, traMADol   Vital Signs    Vitals:   04/17/18 0400 04/17/18 0500 04/17/18 0600 04/17/18 0742  BP: 124/79 (!) 153/83 140/76   Pulse:      Resp: 13 (!) 22 17   Temp: 98.1 F (36.7 C)   98.4 F (36.9 C)  TempSrc: Oral   Oral  SpO2: 99% 93% 92%   Weight:  89.2 kg    Height:        Intake/Output Summary (Last 24 hours) at 04/17/2018 0745 Last data filed at 04/17/2018 0616 Gross per 24 hour  Intake 1180.07 ml  Output 1655 ml  Net -474.93 ml   Last 3 Weights 04/17/2018 04/16/2018 04/12/2018  Weight (lbs) 196 lb 10.4 oz 196 lb 10.4 oz 188 lb 15 oz  Weight (kg) 89.2 kg 89.2 kg 85.7 kg      Telemetry    A pacing underlying SB rates 55 - Personally Reviewed  ECG    NSR 04/17/2018   - Personally  Reviewed  Physical Exam  Post right mini thoracotomy GEN: No acute distress.   Neck: No JVD Cardiac: RRR, no murmurs, rubs, or gallops.  Respiratory: Clear to auscultation bilaterally. GI: Soft, nontender, non-distended  MS: No edema; No deformity. Neuro:  Nonfocal  Psych: Normal affect   Labs    Chemistry Recent Labs  Lab 04/11/18 5956  04/16/18 0411 04/16/18 2350 04/17/18 0608  NA 139   < > 137 138 139  K 3.5   < > 4.2 3.7 3.6  CL 108   < > 110 108 105  CO2 21*   < > 22 27 22   GLUCOSE 104*   < > 126* 120* 111*  BUN 12   < > 13 13 13   CREATININE 1.06   < > 0.96 0.94 1.06  CALCIUM 8.5*   < > 7.6* 7.7* 7.9*  PROT 6.8  --   --   --   --   ALBUMIN 3.4*  --   --   --   --   AST 25  --   --   --   --   ALT 19  --   --   --   --  ALKPHOS 80  --   --   --   --   BILITOT 0.6  --   --   --   --   GFRNONAA >60   < > >60 >60 >60  GFRAA >60   < > >60 >60 >60  ANIONGAP 10   < > 5 3* 12   < > = values in this interval not displayed.     Hematology Recent Labs  Lab 04/16/18 0513 04/16/18 2350 04/17/18 0608  WBC 12.5* 14.8* 14.8*  RBC 3.43* 3.51* 3.74*  HGB 9.8* 10.4* 10.7*  HCT 30.5* 30.6* 33.1*  MCV 88.9 87.2 88.5  MCH 28.6 29.6 28.6  MCHC 32.1 34.0 32.3  RDW 12.4 12.6 12.7  PLT 217 226 248    Cardiac EnzymesNo results for input(s): TROPONINI in the last 168 hours. No results for input(s): TROPIPOC in the last 168 hours.   BNPNo results for input(s): BNP, PROBNP in the last 168 hours.   DDimer No results for input(s): DDIMER in the last 168 hours.   Radiology    Dg Chest Port 1 View  Result Date: 04/16/2018 CLINICAL DATA:  Chest tube present EXAM: PORTABLE CHEST 1 VIEW COMPARISON:  Yesterday FINDINGS: Swan-Ganz catheter has been removed. Left IJ sheath is in stable position. Right-sided chest tubes in stable position. Lower lung volumes with atelectasis at the right more than left base. No pneumothorax. No pleural fluid or edema. Stable postoperative heart size.  IMPRESSION: 1. Remaining hardware in stable position. 2. Atelectasis with lower lung volumes. 3. No evident pneumothorax. Electronically Signed   By: Monte Fantasia M.D.   On: 04/16/2018 08:43   Dg Chest Port 1 View  Result Date: 04/15/2018 CLINICAL DATA:  Post op excision of LT atrial myxoma, transesophageal echocardiogram. Chest tube present. EXAM: PORTABLE CHEST 1 VIEW COMPARISON:  04/12/2018 and 04/11/2018 FINDINGS: Patient has a LEFT IJ Swan-Ganz catheter, tip overlying the pulmonary outflow tract. A RIGHT sided chest tube is in place, tip overlying the lung apex. Patchy opacities are identified at the RIGHT lung base. There is minimal subsegmental atelectasis in the LEFT mid lung zone. No pulmonary edema. IMPRESSION: Postoperative chest. RIGHT LOWER lobe opacity consistent with atelectasis. Electronically Signed   By: Nolon Nations M.D.   On: 04/15/2018 13:57    Cardiac Studies   TTE: normal EF LA myxoma   Patient Profile     63 y.o. male post resection of atrial myxoma. HTN  Assessment & Plan    1. HTN:  Improved with addition of norvasc  2. Post op doing well no atrial arrhythmias will need f/u echo in a few weeks    Will arrange f/u with Dr Radford Pax  For questions or updates, please contact [place] Please consult [email or web address] for contact info under        Signed, Jenkins Rouge, MD  04/17/2018, 7:45 AM

## 2018-04-17 NOTE — Progress Notes (Signed)
IP rehab admissions - I met with patient, but he has aphasia.  I called and spoke with his sister, Stanton Kidney.  His two sisters support inpatient rehab stay prior to home with them.  I will open the case with [employer] and will seek authorization for acute inpatient rehab admission.  Call me for questions.  [phone]

## 2018-04-18 DIAGNOSIS — I484 Atypical atrial flutter: Secondary | ICD-10-CM

## 2018-04-18 LAB — CBC
HCT: 33.4 % — ABNORMAL LOW (ref 39.0–52.0)
Hemoglobin: 10.9 g/dL — ABNORMAL LOW (ref 13.0–17.0)
MCH: 28.7 pg (ref 26.0–34.0)
MCHC: 32.6 g/dL (ref 30.0–36.0)
MCV: 87.9 fL (ref 80.0–100.0)
PLATELETS: 238 10*3/uL (ref 150–400)
RBC: 3.8 MIL/uL — ABNORMAL LOW (ref 4.22–5.81)
RDW: 12.8 % (ref 11.5–15.5)
WBC: 12.8 10*3/uL — AB (ref 4.0–10.5)
nRBC: 0 % (ref 0.0–0.2)

## 2018-04-18 LAB — BASIC METABOLIC PANEL
Anion gap: 13 (ref 5–15)
BUN: 9 mg/dL (ref 8–23)
CO2: 25 mmol/L (ref 22–32)
Calcium: 7.7 mg/dL — ABNORMAL LOW (ref 8.9–10.3)
Chloride: 101 mmol/L (ref 98–111)
Creatinine, Ser: 0.87 mg/dL (ref 0.61–1.24)
GFR calc Af Amer: >60 mL/min (ref >60)
GFR calc non Af Amer: >60 mL/min (ref >60)
Glucose, Bld: 105 mg/dL — ABNORMAL HIGH (ref 70–99)
POTASSIUM: 3.5 mmol/L (ref 3.5–5.1)
Sodium: 139 mmol/L (ref 135–145)

## 2018-04-18 MED ORDER — METOPROLOL TARTRATE 5 MG/5ML IV SOLN
5.0000 mg | Freq: Once | INTRAVENOUS | Status: AC
  Administered 2018-04-18: 5 mg via INTRAVENOUS
  Filled 2018-04-18: qty 5

## 2018-04-18 MED ORDER — METOPROLOL TARTRATE 50 MG PO TABS: 50.0000 mg | ORAL_TABLET | Freq: Two times a day (BID) | ORAL | Status: DC

## 2018-04-18 MED ORDER — POTASSIUM CHLORIDE CRYS ER 20 MEQ PO TBCR
40.0000 meq | EXTENDED_RELEASE_TABLET | Freq: Once | ORAL | Status: AC
  Administered 2018-04-18: 40 meq via ORAL
  Filled 2018-04-18: qty 2

## 2018-04-18 MED ORDER — WARFARIN SODIUM 2.5 MG PO TABS
2.5000 mg | ORAL_TABLET | Freq: Once | ORAL | Status: AC
  Administered 2018-04-18: 2.5 mg via ORAL
  Filled 2018-04-18: qty 1

## 2018-04-18 MED ORDER — METOPROLOL TARTRATE 25 MG PO TABS
25.0000 mg | ORAL_TABLET | Freq: Two times a day (BID) | ORAL | Status: DC
  Administered 2018-04-18: 25 mg via ORAL
  Filled 2018-04-18 (×2): qty 1

## 2018-04-18 MED ORDER — AMIODARONE HCL 200 MG PO TABS: 200.0000 mg | ORAL_TABLET | Freq: Two times a day (BID) | ORAL | Status: DC

## 2018-04-18 MED ORDER — AMIODARONE LOAD VIA INFUSION
150.0000 mg | Freq: Once | INTRAVENOUS | Status: AC
  Administered 2018-04-18: 150 mg via INTRAVENOUS
  Filled 2018-04-18: qty 83.34

## 2018-04-18 MED ORDER — ASPIRIN EC 81 MG PO TBEC
81.0000 mg | DELAYED_RELEASE_TABLET | Freq: Every day | ORAL | Status: DC
  Administered 2018-04-19 – 2018-04-21 (×3): 81 mg via ORAL
  Filled 2018-04-18 (×3): qty 1

## 2018-04-18 MED ORDER — WARFARIN - PHYSICIAN DOSING INPATIENT: Freq: Every day | Status: DC

## 2018-04-18 MED ORDER — METOPROLOL TARTRATE 50 MG PO TABS
50.0000 mg | ORAL_TABLET | Freq: Three times a day (TID) | ORAL | Status: DC
  Administered 2018-04-18 – 2018-04-21 (×9): 50 mg via ORAL
  Filled 2018-04-18 (×9): qty 1

## 2018-04-18 MED ORDER — AMIODARONE HCL 200 MG PO TABS: 200.0000 mg | ORAL_TABLET | Freq: Every day | ORAL | Status: DC

## 2018-04-18 MED ORDER — AMIODARONE HCL IN DEXTROSE 360-4.14 MG/200ML-% IV SOLN
60.0000 mg/h | INTRAVENOUS | Status: AC
  Administered 2018-04-18 (×2): 60 mg/h via INTRAVENOUS
  Filled 2018-04-18 (×3): qty 200

## 2018-04-18 MED ORDER — AMIODARONE HCL IN DEXTROSE 360-4.14 MG/200ML-% IV SOLN
30.0000 mg/h | INTRAVENOUS | Status: AC
  Administered 2018-04-18 – 2018-04-20 (×3): 30 mg/h via INTRAVENOUS
  Filled 2018-04-18 (×6): qty 200

## 2018-04-18 MED ORDER — AMIODARONE IV BOLUS ONLY 150 MG/100ML
150.0000 mg | Freq: Once | INTRAVENOUS | Status: AC
  Administered 2018-04-18: 150 mg via INTRAVENOUS
  Filled 2018-04-18: qty 100

## 2018-04-18 NOTE — Progress Notes (Signed)
Chest tube removed, per order. Sutures intact. No drainage noted to sites. Dressing applied. Pt tolerated well. Pt in bed with call light within reach.  Ara Kussmaul BSN, RN

## 2018-04-18 NOTE — Progress Notes (Addendum)
Patient had 5 second pause followed by 3 shorter pauses, patient stated he felt dizzy and light headed. Patient receiving Amiodarone infusion, BP post-episode was 147/103 heart rate 117. Paged on call MD, awaiting call back. Received orders from Dr. Roxan Hockey for epicardial pacing, patient does not have epicardial pacing wires, new orders to notify Cardiology to assess patient's rhythm events.  Cardiology paged and notified. Will keep pads at bedside and continue to monitor.

## 2018-04-18 NOTE — Progress Notes (Addendum)
Patient remains in a fib with RVR, despite being given  Amiodarone bolus and drip, Lopressor 5 mg IV, and increasing Lopressor to 25 mg bid. I increased Lopressor to 50 mg bid as BP will tolerated and gave another Amiodarone bolus. I asked cardiology to evaluate as well.  As of 1500, patient remains is in a flutter with HR in the 120's. Cardiology has seen and evaluated patient.  As per my discussion with Dr. Roxy Manns, will start patient on low dose Coumadin tonight, decrease ecasa to 81 mg daily, and check PT/INR in am and daily thereafter.

## 2018-04-18 NOTE — Plan of Care (Signed)
  Problem: Education: Goal: Knowledge of General Education information will improve Description Including pain rating scale, medication(s)/side effects and non-pharmacologic comfort measures 04/18/2018 2104 by Winona Legato, Amedeo Gory, RN Outcome: Progressing 04/18/2018 2104 by Winona Legato, Amedeo Gory, RN Outcome: Progressing   Problem: Education: Goal: Knowledge of disease or condition will improve 04/18/2018 2104 by Winona Legato, Amedeo Gory, RN Outcome: Progressing 04/18/2018 2104 by Winona Legato, Amedeo Gory, RN Outcome: Progressing Goal: Knowledge of secondary prevention will improve 04/18/2018 2104 by Winona Legato, Amedeo Gory, RN Outcome: Progressing 04/18/2018 2104 by Winona Legato, Amedeo Gory, RN Outcome: Progressing Goal: Knowledge of patient specific risk factors addressed and post discharge goals established will improve 04/18/2018 2104 by Winona Legato, Amedeo Gory, RN Outcome: Progressing 04/18/2018 2104 by Winona Legato, Amedeo Gory, RN Outcome: Progressing Goal: Individualized Educational Video(s) 04/18/2018 2104 by Winona Legato, Amedeo Gory, RN Outcome: Progressing 04/18/2018 2104 by Winona Legato, Amedeo Gory, RN Outcome: Progressing   Problem: Coping: Goal: Will verbalize positive feelings about self 04/18/2018 2104 by Winona Legato, Amedeo Gory, RN Outcome: Progressing 04/18/2018 2104 by Winona Legato, Amedeo Gory, RN Outcome: Progressing

## 2018-04-18 NOTE — Progress Notes (Addendum)
      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 He denies shortness of breath. He feels heart beating fast.  Objective: Vital signs in last 24 hours: Temp:  [98 F (36.7 C)-98.8 F (37.1 C)] 98.6 F (37 C) (02/07 0357) Pulse Rate:  [66-133] 131 (02/07 0743) Cardiac Rhythm: Atrial fibrillation;Atrial flutter (02/07 0100) Resp:  [17-26] 19 (02/07 0710) BP: (128-157)/(73-103) 151/103 (02/07 0710) SpO2:  [92 %-100 %] 92 % (02/07 0357) Weight:  [88.2 kg] 88.2 kg (02/07 0357)  Pre op weight  89 kg Current Weight  04/18/18 88.2 kg       Intake/Output from previous day: 02/06 0701 - 02/07 0700 In: 542.2 [P.O.:240; IV Piggyback:302.2] Out: 2570 [Urine:2500; Chest Tube:70]   Physical Exam:  Cardiovascular: IRRR IRRR Pulmonary: Slightly diminished right base Abdomen: Soft, non tender, bowel sounds present. Extremities: Trace bilateral lower extremity edema. Wounds: Aquacel intact. Right groin is clean and dry  Lab Results: CBC: Recent Labs    04/17/18 0608 04/18/18 0324  WBC 14.8* 12.8*  HGB 10.7* 10.9*  HCT 33.1* 33.4*  PLT 248 238   BMET:  Recent Labs    04/17/18 0608 04/18/18 0324  NA 139 139  K 3.6 3.5  CL 105 101  CO2 22 25  GLUCOSE 111* 105*  BUN 13 9  CREATININE 1.06 0.87  CALCIUM 7.9* 7.7*    PT/INR:  Lab Results  Component Value Date   INR 1.41 04/15/2018   INR 1.11 04/11/2018   ABG:  INR: Will add last result for INR, ABG once components are confirmed Will add last 4 CBG results once components are confirmed  Assessment/Plan:  1. CV - A fib with RVR. On Amiodarone drip and Lopressor 25 mg bid. Will give Lopressor 5 mg IV this am. Will try not to give Amiodarone bolus unless necessary because has a peripheral IV. 2.   Pulmonary - Chest tubes with 70 cc last 24 hours. Hope to remove chest tubes.On 3 liters of oxygen via [place]. Will check CXR in am. Encourage incentive spirometer 3. Volume Overload - On Lasix 40 mg daily 4.  Acute blood loss anemia - H and H stable at 10.9 and 33.4 5. Supplement potassium 6. Hopefully, will be approved for CIR. Need to have a fib under control prior to going to [facility] 04/18/2018,7:48 AM [phone]   I have seen and examined the patient and agree with the assessment and plan as outlined.  D/C chest tubes.  Continue amiodarone and increase metoprolol.  Possibly ready for d/c to CIR service within 1-2 days if rhythm under control.  Start warfarin if Afib doesn't resolve w/in 24 hours.  Continue ASA and low dose lovenox for now.  Rexene Alberts, MD 04/18/2018 8:41 AM

## 2018-04-18 NOTE — Plan of Care (Signed)
  Problem: Education: Goal: Knowledge of General Education information will improve Description Including pain rating scale, medication(s)/side effects and non-pharmacologic comfort measures Outcome: Progressing   Problem: Education: Goal: Knowledge of disease or condition will improve Outcome: Progressing Goal: Knowledge of secondary prevention will improve Outcome: Progressing Goal: Knowledge of patient specific risk factors addressed and post discharge goals established will improve Outcome: Progressing Goal: Individualized Educational Video(s) Outcome: Progressing   Problem: Coping: Goal: Will verbalize positive feelings about self Outcome: Progressing   Problem: Self-Care: Goal: Ability to participate in self-care as condition permits will improve Outcome: Progressing Goal: Verbalization of feelings and concerns over difficulty with self-care will improve Outcome: Progressing Goal: Ability to communicate needs accurately will improve Outcome: Progressing   Problem: Self-Care: Goal: Ability to participate in self-care as condition permits will improve Outcome: Progressing Goal: Verbalization of feelings and concerns over difficulty with self-care will improve Outcome: Progressing Goal: Ability to communicate needs accurately will improve Outcome: Progressing   Problem: Nutrition: Goal: Risk of aspiration will decrease Outcome: Progressing   Problem: Ischemic Stroke/TIA Tissue Perfusion: Goal: Complications of ischemic stroke/TIA will be minimized Outcome: Progressing

## 2018-04-18 NOTE — Progress Notes (Signed)
  Amiodarone Drug - Drug Interaction Consult Note  Recommendations: MONITOR HEART RATE AND POTASSIUM.  Amiodarone is metabolized by the cytochrome P450 system and therefore has the potential to cause many drug interactions. Amiodarone has an average plasma half-life of 50 days (range 20 to 100 days).   There is potential for drug interactions to occur several weeks or months after stopping treatment and the onset of drug interactions may be slow after initiating amiodarone.   []  Statins: Increased risk of myopathy. Simvastatin- restrict dose to 20mg  daily. Other statins: counsel patients to report any muscle pain or weakness immediately.  []  Anticoagulants: Amiodarone can increase anticoagulant effect. Consider warfarin dose reduction. Patients should be monitored closely and the dose of anticoagulant altered accordingly, remembering that amiodarone levels take several weeks to stabilize.  []  Antiepileptics: Amiodarone can increase plasma concentration of phenytoin, the dose should be reduced. Note that small changes in phenytoin dose can result in large changes in levels. Monitor patient and counsel on signs of toxicity.  [x]  Beta blockers: increased risk of bradycardia, AV block and myocardial depression. Sotalol - avoid concomitant use. [LOPRESSOR]  []   Calcium channel blockers (diltiazem and verapamil): increased risk of bradycardia, AV block and myocardial depression.  []   Cyclosporine: Amiodarone increases levels of cyclosporine. Reduced dose of cyclosporine is recommended.  []  Digoxin dose should be halved when amiodarone is started.  [x]  Diuretics: increased risk of cardiotoxicity if hypokalemia occurs. [ LASIX]  []  Oral hypoglycemic agents (glyburide, glipizide, glimepiride): increased risk of hypoglycemia. Patient's glucose levels should be monitored closely when initiating amiodarone therapy.   []  Drugs that prolong the QT interval:  Torsades de pointes risk may be increased  with concurrent use - avoid if possible.  Monitor QTc, also keep magnesium/potassium WNL if concurrent therapy can't be avoided. Marland Kitchen Antibiotics: e.g. fluoroquinolones, erythromycin. . Antiarrhythmics: e.g. quinidine, procainamide, disopyramide, sotalol. . Antipsychotics: e.g. phenothiazines, haloperidol.  . Lithium, tricyclic antidepressants, and methadone.  Wynona Neat, PharmD, BCPS 04/18/2018 1:40 AM

## 2018-04-18 NOTE — Progress Notes (Signed)
Occupational Therapy Treatment Patient Details Name: Shawn Mccann MRN: [id number] DOB: 1955/04/01 Today's Date: 04/18/2018    History of present illness Pt is a 63 y/o male admitted secondary to expressive aphasia. 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 Pt is undergoing pre-op work up for CABG for resection of myxoma, s/p resection of myoxoma.  PMH including but not limited to hypertension, hyperlipidemia    OT comments  Focus of session on ADL training and transfer to chair. Pt requiring set up for seated grooming, min assist for UB bathing and dressing. Transferred with min guard assist from bed to chair with RW. Reports pain at incision site, but does not interfere with participation and pt refusing pain medications.   Follow Up Recommendations  CIR    Equipment Recommendations  3 in 1 bedside commode    Recommendations for Other Services      Precautions / Restrictions Precautions Precautions: Fall Precaution Comments: watch HR, chest tube, bulb drain       Mobility Bed Mobility Overal bed mobility: Needs Assistance Bed Mobility: Supine to Sit     Supine to sit: Supervision        Transfers Overall transfer level: Needs assistance Equipment used: Rolling walker (2 wheeled) Transfers: Sit to/from [facility] Sit to Stand: Min guard Stand pivot transfers: Min guard       General transfer comment: increased time and effort to stand, cues for hand placement    Balance Overall balance assessment: Needs assistance   Sitting balance-Leahy Scale: Fair       Standing balance-Leahy Scale: Poor Standing balance comment: min external support needed when releasing walker for pericare                           ADL either performed or assessed with clinical judgement   ADL Overall ADL's : Needs assistance/impaired     Grooming: Wash/dry hands;Wash/dry face;Brushing hair;Oral  care;Sitting;Set up   Upper Body Bathing: Minimal assistance;Sitting   Lower Body Bathing: Minimal assistance;Sit to/from stand   Upper Body Dressing : Minimal assistance;Sitting           Toileting- Clothing Manipulation and Hygiene: Minimal assistance;Sit to/from stand               Vision       Perception     Praxis      Cognition Arousal/Alertness: Awake/alert Behavior During Therapy: WFL for tasks assessed/performed Overall Cognitive Status: Impaired/Different from baseline                               Problem Solving: Difficulty sequencing;Requires verbal cues          Exercises     Shoulder Instructions       General Comments      Pertinent Vitals/ Pain       Pain Assessment: Faces Faces Pain Scale: Hurts little more Pain Location: chest incision Pain Descriptors / Indicators: Grimacing;Guarding Pain Intervention(s): Monitored during session  Home Living                                          Prior Functioning/Environment              Frequency  Min 3X/week  Progress Toward Goals  OT Goals(current goals can now be found in the care plan section)  Progress towards OT goals: Progressing toward goals  Acute Rehab OT Goals Patient Stated Goal: get stronger OT Goal Formulation: With patient Time For Goal Achievement: 04/30/18 Potential to Achieve Goals: Good  Plan Discharge plan remains appropriate    Co-evaluation                 AM-PAC OT "6 Clicks" Daily Activity     Outcome Measure   Help from another person eating meals?: None Help from another person taking care of personal grooming?: A Little Help from another person toileting, which includes using toliet, bedpan, or urinal?: A Little Help from another person bathing (including washing, rinsing, drying)?: A Lot Help from another person to put on and taking off regular upper body clothing?: A Little Help from another  person to put on and taking off regular lower body clothing?: A Lot 6 Click Score: 17    End of Session Equipment Utilized During Treatment: Rolling walker;Gait belt  OT Visit Diagnosis: Unsteadiness on feet (R26.81);Pain;Cognitive communication deficit (R41.841) Symptoms and signs involving cognitive functions: Cerebral infarction Pain - Right/Left: Right   Activity Tolerance Patient tolerated treatment well   Patient Left in chair;with call bell/phone within reach   Nurse Communication          Time: 5537-4827 OT Time Calculation (min): 33 min  Charges: OT General Charges $OT Visit: 1 Visit OT Treatments $Self Care/Home Management : 23-37 mins  Nestor Lewandowsky, OTR/L Acute Rehabilitation Services Pager: [phone] Office: [phone]   Malka So 04/18/2018, 10:48 AM

## 2018-04-18 NOTE — Progress Notes (Signed)
Physical Therapy Treatment Patient Details Name: Shawn Mccann MRN: [id number] DOB: 06/12/1955 Today's Date: 04/18/2018    History of Present Illness Pt is a 63 y/o male admitted secondary to expressive aphasia. 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 Pt is undergoing pre-op work up for CABG for resection of myxoma, s/p resection of myoxoma.  PMH including but not limited to hypertension, hyperlipidemia     PT Comments    Ambulation deferred after discussion with RN due to HR 118 bpm afib. Session focused on bed level lower extremity strengthening (see below). Will progress mobility when medically appropriate.    Follow Up Recommendations  CIR     Equipment Recommendations  Other (comment)(tbd)    Recommendations for Other Services       Precautions / Restrictions Precautions Precautions: Fall Precaution Comments: watch HR, chest tube, bulb drain Restrictions Weight Bearing Restrictions: Yes Other Position/Activity Restrictions: sternal precautions    Mobility  Bed Mobility                  Transfers                    Ambulation/Gait                 Stairs             Wheelchair Mobility    Modified Rankin (Stroke Patients Only)       Balance                                            Cognition Arousal/Alertness: Awake/alert Behavior During Therapy: WFL for tasks assessed/performed Overall Cognitive Status: Impaired/Different from baseline                               Problem Solving: Difficulty sequencing;Requires verbal cues        Exercises General Exercises - Lower Extremity Heel Slides: 15 reps;Both;Supine Hip ABduction/ADduction: 15 reps;Both;Supine Straight Leg Raises: 15 reps;Both;Supine Other Exercises Other Exercises: Supine hip bridging x 20    General Comments        Pertinent Vitals/Pain Pain Assessment:  Faces Faces Pain Scale: No hurt    Home Living                      Prior Function            PT Goals (current goals can now be found in the care plan section) Acute Rehab PT Goals Patient Stated Goal: get stronger Potential to Achieve Goals: Good    Frequency    Min 4X/week      PT Plan Current plan remains appropriate    Co-evaluation              AM-PAC PT "6 Clicks" Mobility   Outcome Measure  Help needed turning from your back to your side while in a flat bed without using bedrails?: None Help needed moving from lying on your back to sitting on the side of a flat bed without using bedrails?: A Little Help needed moving to and from a bed to a chair (including a wheelchair)?: A Lot Help needed standing up from a chair using your arms (e.g., wheelchair or bedside chair)?: A Lot Help needed to walk in  hospital room?: A Lot Help needed climbing 3-5 steps with a railing? : Total 6 Click Score: 14    End of Session   Activity Tolerance: Patient tolerated treatment well Patient left: in bed;with call bell/phone within reach Nurse Communication: Other (comment)(vitals) PT Visit Diagnosis: Other abnormalities of gait and mobility (R26.89);Other symptoms and signs involving the nervous system (R29.898)     Time: 5102-5852 PT Time Calculation (min) (ACUTE ONLY): 11 min  Charges:  $Therapeutic Exercise: 8-22 mins                    Ellamae Sia, PT, DPT Acute Rehabilitation Services Pager [phone] Office [phone]    Willy Eddy 04/18/2018, 4:05 PM

## 2018-04-18 NOTE — Progress Notes (Signed)
57 Talked with pt's RN.  Heart rate at 118 ? A fib. Pt to receive dose of beta blocker soon. RN to walk with pt later this evening. We will continue to follow. Graylon Good RN BSN 04/18/2018 2:11 PM

## 2018-04-18 NOTE — Progress Notes (Addendum)
Progress Note  Patient Name: Shawn Mccann Date of Encounter: 04/18/2018  Primary Cardiologist: Fransico Him, MD   Subjective   Had palpitation since this morning. Otherwise doing ok.  Inpatient Medications    Scheduled Meds: . acetaminophen  1,000 mg Oral Q6H  . amLODipine  5 mg Oral Daily  . aspirin EC  325 mg Oral Daily  . bisacodyl  10 mg Oral Daily   Or  . bisacodyl  10 mg Rectal Daily  . Chlorhexidine Gluconate Cloth  6 each Topical Daily  . docusate sodium  200 mg Oral Daily  . enoxaparin (LOVENOX) injection  40 mg Subcutaneous Q24H  . furosemide  40 mg Oral Daily  . [facility] Blood Cardioplegia (KBC) lidocaine 2% Syringe (44mL)  13 mL Intracoronary Once  . [facility] Blood Cardioplegia (KBC) mannitol 20% Syringe (14mL)  32 mL Intracoronary Once  . mouth rinse  15 mL Mouth Rinse BID  . metoprolol tartrate  50 mg Oral BID  . pantoprazole  40 mg Oral Daily  . sodium chloride flush  3 mL Intravenous Q12H   Continuous Infusions: . sodium chloride Stopped (04/16/18 1530)  . amiodarone 30 mg/hr (04/18/18 0820)  . lactated ringers Stopped (04/16/18 1017)   PRN Meds: metoprolol tartrate, morphine injection, ondansetron (ZOFRAN) IV, oxyCODONE, sodium chloride flush, traMADol   Vital Signs    Vitals:   04/18/18 0826 04/18/18 1000 04/18/18 1056 04/18/18 1100  BP: (!) 148/107 (!) 140/113  (!) 159/114  Pulse: (!) 130 (!) 129 (!) 131 (!) 125  Resp: (!) 27 19  20   Temp: 98.1 F (36.7 C)   98.4 F (36.9 C)  TempSrc: Oral   Oral  SpO2: 92% 92%  95%  Weight:      Height:        Intake/Output Summary (Last 24 hours) at 04/18/2018 1314 Last data filed at 04/18/2018 1155 Gross per 24 hour  Intake 790.17 ml  Output 2680 ml  Net -1889.83 ml   Last 3 Weights 04/18/2018 04/17/2018 04/16/2018  Weight (lbs) 194 lb 7.1 oz 196 lb 10.4 oz 196 lb 10.4 oz  Weight (kg) 88.2 kg 89.2 kg 89.2 kg      Telemetry    2:1 atrial flutter with HR 120s - Personally Reviewed  ECG    Last  EKG 2/5 possible junctional rhythm, HR 61 - Personally Reviewed  Physical Exam   GEN: No acute distress.   Neck: No JVD Cardiac: tachycardic, no murmurs, rubs, or gallops.  Respiratory: Clear to auscultation bilaterally. Chest tube in GI: Soft, nontender, non-distended  MS: No edema; No deformity. Neuro:  Nonfocal  Psych: Normal affect   Labs    Chemistry Recent Labs  Lab 04/16/18 2350 04/17/18 0608 04/18/18 0324  NA 138 139 139  K 3.7 3.6 3.5  CL 108 105 101  CO2 27 22 25   GLUCOSE 120* 111* 105*  BUN 13 13 9   CREATININE 0.94 1.06 0.87  CALCIUM 7.7* 7.9* 7.7*  GFRNONAA >60 >60 >60  GFRAA >60 >60 >60  ANIONGAP 3* 12 13     Hematology Recent Labs  Lab 04/16/18 2350 04/17/18 0608 04/18/18 0324  WBC 14.8* 14.8* 12.8*  RBC 3.51* 3.74* 3.80*  HGB 10.4* 10.7* 10.9*  HCT 30.6* 33.1* 33.4*  MCV 87.2 88.5 87.9  MCH 29.6 28.6 28.7  MCHC 34.0 32.3 32.6  RDW 12.6 12.7 12.8  PLT 226 248 238    Cardiac EnzymesNo results for input(s): TROPONINI in the last 168 hours. No  results for input(s): TROPIPOC in the last 168 hours.   BNPNo results for input(s): BNP, PROBNP in the last 168 hours.   DDimer No results for input(s): DDIMER in the last 168 hours.   Radiology    Dg Chest Port 1 View  Result Date: 04/17/2018 CLINICAL DATA:  Pleural effusion EXAM: PORTABLE CHEST 1 VIEW COMPARISON:  04/16/2018 FINDINGS: 2 right chest tubes remain in place, 1 in the apex and 1 in the medial base. No pneumothorax. Elevated right hemidiaphragm with right lower lobe consolidation unchanged. No significant right pleural effusion. Left lung is clear. Left jugular sheath in the left innominate vein. IMPRESSION: Two chest tubes remain on the right. Right lower lobe atelectasis without significant pleural effusion. No pneumothorax. Electronically Signed   By: Franchot Gallo M.D.   On: 04/17/2018 07:44    Cardiac Studies   Echo 04/10/2018 LV EF: 55% -   60%  Study Conclusions  - Left  ventricle: The cavity size was normal. Wall thickness was   normal. Systolic function was normal. The estimated ejection   fraction was in the range of 55% to 60%. Wall motion was normal;   there were no regional wall motion abnormalities. - Left atrium: There is a medium-sized mobilemyxoma attached to the   fossa ovalis. - Right atrium: No evidence of thrombus in the atrial cavity or   appendage. - Atrial septum: No defect or patent foramen ovale was identified.   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  These usually run posterior to the aorta and not intra-arterial.  We were unable to confirm the course.  LAD contains mid 40% eccentric narrowing.  Right coronary is dominant and without obstruction.  Normal LVEDP.  No tumor blush is noted, hence possibly explaining fragmentation of the myxoma leading to multiple emboli.  Tachycardia and hypotension secondary to beta-blocker withdrawal  RECOMMENDATIONS:   Resume IV heparin in 4 hours without bolus.  Consider adding beta-blocker therapy to solve beta-blocker withdrawal.    Myxoma resection by Dr. Roxy Manns 04/15/2018  Minimally-Invasive Resection Left Atrial Myxoma             Ultrasound-guided percutaneous arterial and venous access for cardiopulmonary bypass             Right miniature anterior thoracotomy             Autologous pericardial patch closure of interatrial septum  Patient Profile     63 y.o. male with PMH of HTN and HLD presented on 1/29 with acute CVA 2/2 to emboli. 2D echo showed 2.2x1.3 cm pedunculated LA mass at the fossa ovalis c/w myxoma. Confirmed on TEE 04/10/2018. 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 Patient underwent minimally invasive resection of LA myxoma.   Assessment & Plan    1. LA myxoma s/p minimal  invasive resection 04/15/2018  - chest tube still in  2. Postop atrial flutter  - started at exactly 1:00AM on 04/18/2017, currently on IV amiodarone.   - metoprolol increased to 50mg  BID. Given his BP, I think he can tolerate a higher dose, will change metoprolol to 50mg  TID. Note, he was on 200mg  daily Toprol XL prior to admission  - unfortunately he has 2:1 atrial flutter with HR 120s, this is very resistant toward rate control.   3. Embolic CVA likely 2/2 myxoma  4.  HTN: BP high, on amlodipine, increase metoprolol to 50mg  TID for better rate control.  5. HLD: restart home pravastatin        For questions or updates, please contact [place] Please consult [email or web address] for contact info under        Signed, Almyra Deforest, [place]  04/18/2018, 1:14 PM     Attending Note:   The patient was seen and examined.  Agree with assessment and plan as noted above.  Changes made to the above note as needed.  Patient seen and independently examined with  Almyra Deforest, PA .   We discussed all aspects of the encounter. I agree with the assessment and plan as stated above.    Shawn Mccann is a 63 year old gentleman who presented to the hospital after having a stroke. Echocardiogram revealed a large left atrial myxoma. He is status post atrial myxoma resection and now has developed atrial flutter with a rapid ventricular response.  1.  Atrial flutter: Patient is now on amiodarone and also metoprolol.  His rate occasionally will slow slightly.  My hope is that he will convert to normal sinus rhythm as we continue the amiodarone and metoprolol.  Continue to gradually titrate up the metoprolol to help with rate control. Dr. Roxy Manns has given the okay to start Coumadin tomorrow if he remains in atrial flutter.   I have spent a total of 40 minutes with patient reviewing hospital  notes , telemetry, EKGs, labs and examining patient as well as establishing an assessment and plan that was discussed with the patient. >  50% of time was spent in direct patient care.    Thayer Headings, Brooke Bonito., MD, [facility] 04/18/2018, 2:40 PM [address],  [place] Pager [phone]

## 2018-04-19 ENCOUNTER — Inpatient Hospital Stay (HOSPITAL_COMMUNITY): Payer: BLUE CROSS/BLUE SHIELD

## 2018-04-19 LAB — PROTIME-INR
INR: 1.1
Prothrombin Time: 14.1 seconds (ref 11.4–15.2)

## 2018-04-19 MED ORDER — WARFARIN SODIUM 2.5 MG PO TABS
2.5000 mg | ORAL_TABLET | Freq: Every day | ORAL | Status: DC
  Administered 2018-04-19: 2.5 mg via ORAL
  Filled 2018-04-19: qty 1

## 2018-04-19 MED ORDER — POTASSIUM CHLORIDE CRYS ER 20 MEQ PO TBCR
20.0000 meq | EXTENDED_RELEASE_TABLET | Freq: Every day | ORAL | Status: DC
  Administered 2018-04-19: 20 meq via ORAL
  Filled 2018-04-19 (×2): qty 1

## 2018-04-19 MED ORDER — LACTULOSE 10 GM/15ML PO SOLN
20.0000 g | Freq: Once | ORAL | Status: DC
  Filled 2018-04-19: qty 30

## 2018-04-19 MED ORDER — METOPROLOL TARTRATE 5 MG/5ML IV SOLN
5.0000 mg | Freq: Once | INTRAVENOUS | Status: AC
  Administered 2018-04-19: 5 mg via INTRAVENOUS
  Filled 2018-04-19: qty 5

## 2018-04-19 NOTE — Discharge Instructions (Addendum)
Discharge Instructions:  1. You may shower, please wash incisions daily with soap and water and keep dry.  If you wish to cover wounds with dressing you may do so but please keep clean and change daily.  No tub baths or swimming until incisions have completely healed.  If your incisions become red or develop any drainage please call our office at [phone]  2. No Driving until cleared by Dr. Guy Sandifer office and you are no longer using narcotic pain medications  3. Monitor your weight daily.. Please use the same scale and weigh at same time... If you gain 5-10 lbs in 48 hours with associated lower extremity swelling, please contact our office at [phone]  4. Fever of 101.5 for at least 24 hours with no source, please contact our office at [phone]  5. Activity- up as tolerated, please walk at least 3 times per day.  Avoid strenuous activity, no lifting, pushing, or pulling with your arms over 8-10 lbs for a minimum of 3 weeks  6. If any questions or concerns arise, please do not hesitate to contact our office at [phone] ----------  Information on my medicine - ELIQUIS (apixaban)  Why was Eliquis prescribed for you? Eliquis was prescribed for you to reduce the risk of forming blood clots that can cause a stroke if you have a medical condition called atrial fibrillation (a type of irregular heartbeat) OR to reduce the risk of a blood clots forming after orthopedic surgery.  What do You need to know about Eliquis ? Take your Eliquis TWICE DAILY - one tablet in the morning and one tablet in the evening with or without food.  It would be best to take the doses about the same time each day.  If you have difficulty swallowing the tablet whole please discuss with your pharmacist how to take the medication safely.  Take Eliquis exactly as prescribed by your doctor and DO NOT stop taking Eliquis without talking to the doctor who prescribed the medication.  Stopping may increase  your risk of developing a new clot or stroke.  Refill your prescription before you run out.  After discharge, you should have regular check-up appointments with your healthcare provider that is prescribing your Eliquis.  In the future your dose may need to be changed if your kidney function or weight changes by a significant amount or as you get older.  What do you do if you miss a dose? If you miss a dose, take it as soon as you remember on the same day and resume taking twice daily.  Do not take more than one dose of ELIQUIS at the same time.  Important Safety Information A possible side effect of Eliquis is bleeding. You should call your healthcare provider right away if you experience any of the following: ? Bleeding from an injury or your nose that does not stop. ? Unusual colored urine (red or dark brown) or unusual colored stools (red or black). ? Unusual bruising for unknown reasons. ? A serious fall or if you hit your head (even if there is no bleeding).  Some medicines may interact with Eliquis and might increase your risk of bleeding or clotting while on Eliquis. To help avoid this, consult your healthcare provider or pharmacist prior to using any new prescription or non-prescription medications, including herbals, vitamins, non-steroidal anti-inflammatory drugs (NSAIDs) and supplements.  This website has more information on Eliquis (apixaban): [email or web address].

## 2018-04-19 NOTE — Progress Notes (Addendum)
      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  Objective: Vital signs in last 24 hours: Temp:  [97.5 F (36.4 C)-98.5 F (36.9 C)] 97.5 F (36.4 C) (02/08 0327) Pulse Rate:  [117-131] 124 (02/08 0327) Cardiac Rhythm: Atrial fibrillation (02/08 0700) Resp:  [19-27] 19 (02/08 0327) BP: (124-159)/(87-122) 125/108 (02/08 0343) SpO2:  [92 %-96 %] 95 % (02/08 0327) Weight:  [83.8 kg] 83.8 kg (02/08 0327)  Pre op weight  89 kg Current Weight  04/19/18 83.8 kg      Intake/Output from previous day: 02/07 0701 - 02/08 0700 In: 955.2 [P.O.:818; I.V.:137.2] Out: 2122 [Urine:1075; Chest Tube:180]   Physical Exam:  Cardiovascular: IRRR IRRR Pulmonary: Slightly diminished right base Abdomen: Soft, non tender, bowel sounds present. Extremities: Trace bilateral lower extremity edema. Wounds: Wound is clean and dry. Right groin is clean and dry  Lab Results: CBC: Recent Labs    04/17/18 0608 04/18/18 0324  WBC 14.8* 12.8*  HGB 10.7* 10.9*  HCT 33.1* 33.4*  PLT 248 238   BMET:  Recent Labs    04/17/18 0608 04/18/18 0324  NA 139 139  K 3.6 3.5  CL 105 101  CO2 22 25  GLUCOSE 111* 105*  BUN 13 9  CREATININE 1.06 0.87  CALCIUM 7.9* 7.7*    PT/INR:  Lab Results  Component Value Date   INR 1.10 04/19/2018   INR 1.41 04/15/2018   INR 1.11 04/11/2018   ABG:  INR: Will add last result for INR, ABG once components are confirmed Will add last 4 CBG results once components are confirmed  Assessment/Plan:  1. CV - A fib/flutter with RVR. He had a 5 second pause followed by shorter pauses after 9 pm last evening. He remains in a fib/fl with HR 120's+. He will be given Lopressor 5 mg IV this am. He is on Amiodarone drip and Lopressor 50  mg tid, Amlodipine 5 mg daily, and Coumadin (first dose last evening). Cardiology to make recommendations as still not controlled. INR this am 1.10. 2.  Pulmonary - On room air. CXR this am appears to show some improvement of right base atelectasis. Encourage incentive spirometer 3. Volume Overload - On Lasix 40 mg daily 4.  Acute blood loss anemia - H and H stable at 10.9 and 33.4 5. Lactulose for constipation 6. Hopefully, will be approved for CIR. Need to have a fib/fl under control prior to going to [place] 04/19/2018,7:18 AM [phone]  Patient seen and examined, agree with above Neuro- expressive aphasia stable to slightly improved Still in flutter with 2:1 conduction  [place] C. Roxan Hockey, MD [facility] [phone]

## 2018-04-19 NOTE — Progress Notes (Addendum)
Progress Note  Patient Name: Shawn Mccann Date of Encounter: 04/19/2018  Primary Cardiologist: Fransico Him, MD   Subjective   Had 5 second pause on tele last night but still in afib with RVr at 120bpm this am and given Lopressor 5mg  IV.  Remains on Amio gtt and Lopressor 50mg  TID  Inpatient Medications    Scheduled Meds: . acetaminophen  1,000 mg Oral Q6H  . amLODipine  5 mg Oral Daily  . aspirin EC  81 mg Oral Daily  . bisacodyl  10 mg Oral Daily   Or  . bisacodyl  10 mg Rectal Daily  . Chlorhexidine Gluconate Cloth  6 each Topical Daily  . docusate sodium  200 mg Oral Daily  . enoxaparin (LOVENOX) injection  40 mg Subcutaneous Q24H  . furosemide  40 mg Oral Daily  . [facility] Blood Cardioplegia (KBC) lidocaine 2% Syringe (30mL)  13 mL Intracoronary Once  . [facility] Blood Cardioplegia (KBC) mannitol 20% Syringe (66mL)  32 mL Intracoronary Once  . lactulose  20 g Oral Once  . mouth rinse  15 mL Mouth Rinse BID  . metoprolol tartrate  50 mg Oral TID  . pantoprazole  40 mg Oral Daily  . potassium chloride  20 mEq Oral Daily  . sodium chloride flush  3 mL Intravenous Q12H  . warfarin  2.5 mg Oral q1800  . Warfarin - Physician Dosing Inpatient   Does not apply q1800   Continuous Infusions: . sodium chloride Stopped (04/16/18 1530)  . amiodarone 30 mg/hr (04/19/18 0348)  . lactated ringers Stopped (04/16/18 1017)   PRN Meds: metoprolol tartrate, morphine injection, ondansetron (ZOFRAN) IV, oxyCODONE, sodium chloride flush, traMADol   Vital Signs    Vitals:   04/19/18 0327 04/19/18 0343 04/19/18 0822 04/19/18 1311  BP: (!) 140/122 (!) 125/108 (!) 136/100 (!) 123/101  Pulse: (!) 124  (!) 133 (!) 126  Resp: 19  18 20   Temp: (!) 97.5 F (36.4 C)  97.8 F (36.6 C) 98 F (36.7 C)  TempSrc: Oral  Oral Oral  SpO2: 95%  93% 92%  Weight: 83.8 kg     Height:        Intake/Output Summary (Last 24 hours) at 04/19/2018 1511 Last data filed at 04/19/2018 1312 Gross per 24  hour  Intake 867.22 ml  Output 2175 ml  Net -1307.78 ml   Filed Weights   04/17/18 0500 04/18/18 0357 04/19/18 0327  Weight: 89.2 kg 88.2 kg 83.8 kg    Telemetry    Atrial fibrillation - Personally Reviewed  ECG    No new EKG to review - Personally Reviewed  Physical Exam   GEN: No acute distress.   Neck: No JVD Cardiac:  Irregularly irregular and tachycardic, no murmurs, rubs, or gallops.  Respiratory: Clear to auscultation bilaterally. GI: Soft, nontender, non-distended  MS: No edema; No deformity. Neuro:  Nonfocal  Psych: Normal affect   Labs    Chemistry Recent Labs  Lab 04/16/18 2350 04/17/18 0608 04/18/18 0324  NA 138 139 139  K 3.7 3.6 3.5  CL 108 105 101  CO2 27 22 25   GLUCOSE 120* 111* 105*  BUN 13 13 9   CREATININE 0.94 1.06 0.87  CALCIUM 7.7* 7.9* 7.7*  GFRNONAA >60 >60 >60  GFRAA >60 >60 >60  ANIONGAP 3* 12 13     Hematology Recent Labs  Lab 04/16/18 2350 04/17/18 0608 04/18/18 0324  WBC 14.8* 14.8* 12.8*  RBC 3.51* 3.74* 3.80*  HGB 10.4* 10.7*  10.9*  HCT 30.6* 33.1* 33.4*  MCV 87.2 88.5 87.9  MCH 29.6 28.6 28.7  MCHC 34.0 32.3 32.6  RDW 12.6 12.7 12.8  PLT 226 248 238    Cardiac EnzymesNo results for input(s): TROPONINI in the last 168 hours. No results for input(s): TROPIPOC in the last 168 hours.   BNPNo results for input(s): BNP, PROBNP in the last 168 hours.   DDimer No results for input(s): DDIMER in the last 168 hours.   Radiology    Dg Chest 2 View  Result Date: 04/19/2018 CLINICAL DATA:  Atelectasis EXAM: CHEST - 2 VIEW COMPARISON:  Two days ago FINDINGS: Unchanged streaky density in the right chest with volume loss. No evidence of pneumothorax. No pulmonary edema. Trace right pleural effusion. Stable postoperative heart size. IMPRESSION: 1. No pneumothorax after chest tube removal. 2. Asymmetric right-sided atelectasis. Electronically Signed   By: Monte Fantasia M.D.   On: 04/19/2018 09:24    Cardiac Studies   Echo  04/10/2018 LV EF: 55% - 60%  Study Conclusions  - Left ventricle: The cavity size was normal. Wall thickness was normal. Systolic function was normal. The estimated ejection fraction was in the range of 55% to 60%. Wall motion was normal; there were no regional wall motion abnormalities. - Left atrium: There is a medium-sized mobilemyxoma attached to the fossa ovalis. - Right atrium: No evidence of thrombus in the atrial cavity or appendage. - Atrial septum: No defect or patent foramen ovale was identified.   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 These usually run posterior to the aorta and not intra-arterial. We were unable to confirm the course.  LAD contains mid 40% eccentric narrowing.  Right coronary is dominant and without obstruction.  Normal LVEDP.  No tumor blush is noted, hence possibly explaining fragmentation of the myxoma leading to multiple emboli.  Tachycardia and hypotension secondary to beta-blocker withdrawal  RECOMMENDATIONS:   Resume IV heparin in 4 hours without bolus.  Consider adding beta-blocker therapy to solve beta-blocker withdrawal.    Myxoma resection by Dr. Roxy Manns 04/15/2018  Minimally-InvasiveResection Left Atrial Myxoma Ultrasound-guided percutaneous arterial and venous access for cardiopulmonary bypass Right miniature anterior thoracotomy Autologous pericardial patch closure of interatrial septum  Patient Profile     63 y.o. male with PMH of HTN and HLD presented on 1/29 with acute CVA 2/2 to emboli. 2D echo showed 2.2x1.3 cm pedunculated LA mass at the fossa ovalis c/w myxoma. Confirmed on TEE 04/10/2018. 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 Patient underwent minimally invasive resection  of LA myxoma.   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 Postop atrial flutter - started at exactly 1:00AM on 04/18/2017, currently on IV amiodarone.  - metoprolol increased to 50mg  TID. ( Note, he was on 200mg  daily Toprol XL prior to admission) - unfortunately he has 2:1 atrial flutter with HR 120s, this is very resistant toward rate control.  -On telemetry today he had a 5-second postconversion pause with 2 normal sinus beats and then went back into atrial flutter with RVR -We will give another dose of IV amiodarone 150 mg in hopes that we can convert him and maintain sinus rhythm. -Need to get EP involved  3. 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 HTN -BP remains elevated at  123/155mmHg.   -Metoprolol increased to 50mg  TID yesterday  5. HLD -continue home does of pravastatin      For questions or updates, please contact [place] Please consult [email or web address] for contact info under Cardiology/STEMI.      Signed, Fransico Him, MD  04/19/2018, 3:11 PM

## 2018-04-19 NOTE — Progress Notes (Signed)
Pt walked with walker, 800 ft. He went off the floor to rehab and back.

## 2018-04-20 LAB — PROTIME-INR
INR: 1.17
Prothrombin Time: 14.8 s (ref 11.4–15.2)

## 2018-04-20 LAB — BASIC METABOLIC PANEL
ANION GAP: 13 (ref 5–15)
BUN: 11 mg/dL (ref 8–23)
CO2: 24 mmol/L (ref 22–32)
Calcium: 8.1 mg/dL — ABNORMAL LOW (ref 8.9–10.3)
Chloride: 100 mmol/L (ref 98–111)
Creatinine, Ser: 0.95 mg/dL (ref 0.61–1.24)
GFR calc non Af Amer: >60 mL/min (ref >60)
Glucose, Bld: 98 mg/dL (ref 70–99)
Potassium: 3.4 mmol/L — ABNORMAL LOW (ref 3.5–5.1)
Sodium: 137 mmol/L (ref 135–145)

## 2018-04-20 MED ORDER — ATORVASTATIN CALCIUM 40 MG PO TABS
40.0000 mg | ORAL_TABLET | Freq: Every day | ORAL | Status: DC
  Administered 2018-04-20 – 2018-04-23 (×4): 40 mg via ORAL
  Filled 2018-04-20 (×4): qty 1

## 2018-04-20 MED ORDER — POTASSIUM CHLORIDE CRYS ER 20 MEQ PO TBCR
40.0000 meq | EXTENDED_RELEASE_TABLET | Freq: Once | ORAL | Status: AC
  Administered 2018-04-20: 40 meq via ORAL
  Filled 2018-04-20: qty 2

## 2018-04-20 MED ORDER — WARFARIN SODIUM 5 MG PO TABS
5.0000 mg | ORAL_TABLET | Freq: Every day | ORAL | Status: DC
  Administered 2018-04-20: 5 mg via ORAL
  Filled 2018-04-20: qty 1

## 2018-04-20 MED ORDER — AMIODARONE HCL 200 MG PO TABS
200.0000 mg | ORAL_TABLET | Freq: Two times a day (BID) | ORAL | Status: DC
  Administered 2018-04-20 – 2018-04-23 (×7): 200 mg via ORAL
  Filled 2018-04-20 (×7): qty 1

## 2018-04-20 MED ORDER — PRAVASTATIN SODIUM 40 MG PO TABS: 80.0000 mg | ORAL_TABLET | Freq: Every day | ORAL | Status: DC

## 2018-04-20 MED ORDER — POTASSIUM CHLORIDE CRYS ER 20 MEQ PO TBCR
40.0000 meq | EXTENDED_RELEASE_TABLET | Freq: Two times a day (BID) | ORAL | Status: AC
  Administered 2018-04-20 – 2018-04-21 (×4): 40 meq via ORAL
  Filled 2018-04-20 (×3): qty 2

## 2018-04-20 MED ORDER — POTASSIUM CHLORIDE CRYS ER 20 MEQ PO TBCR: 20.0000 meq | EXTENDED_RELEASE_TABLET | Freq: Every day | ORAL | Status: DC

## 2018-04-20 NOTE — Progress Notes (Signed)
Progress Note  Patient Name: Shawn Mccann Date of Encounter: 04/20/2018  Primary Cardiologist: Fransico Him, MD   Subjective   Remains in 2:1 atrial flutter despite IV Amio and Lopressor 50mg  TID  Inpatient Medications    Scheduled Meds: . acetaminophen  1,000 mg Oral Q6H  . amiodarone  200 mg Oral BID  . amLODipine  5 mg Oral Daily  . aspirin EC  81 mg Oral Daily  . atorvastatin  40 mg Oral q1800  . bisacodyl  10 mg Oral Daily   Or  . bisacodyl  10 mg Rectal Daily  . Chlorhexidine Gluconate Cloth  6 each Topical Daily  . docusate sodium  200 mg Oral Daily  . enoxaparin (LOVENOX) injection  40 mg Subcutaneous Q24H  . furosemide  40 mg Oral Daily  . [place] [facility] Cardioplegia (KBC) lidocaine 2% Syringe (28mL)  13 mL Intracoronary Once  . [place] [facility]ople[facility] (KBC) mannitol 20% Syringe (44mL)  32 mL Intracoronary Once  . lactulose  20 g Oral Once  . mouth rinse  15 mL Mouth Rinse BID  . metoprolol tartrate  50 mg Oral TID  . pantoprazole  40 mg Oral Daily  . potassium chloride  40 mEq Oral BID  . sodium chloride flush  3 mL Intravenous Q12H  . warfarin  5 mg Oral q1800  . Warfarin - Physician Dosing Inpatient   Does not apply q1800   Continuous Infusions: . sodium chloride Stopped (04/16/18 1530)  . amiodarone 30 mg/hr (04/20/18 0620)  . lactated ringers Stopped (04/16/18 1017)   PRN Meds: metoprolol tartrate, morphine injection, ondansetron (ZOFRAN) IV, oxyCODONE, sodium chloride flush, traMADol   Vital Signs    Vitals:   04/19/18 2011 04/19/18 2337 04/20/18 0347 04/20/18 0359  BP: (!) 116/96 (!) 142/98  (!) 130/100  Pulse: (!) 129   (!) 132  Resp: (!) 21  (!) 28 19  Temp: 98.7 F (37.1 C) 98.9 F (37.2 C)  98.5 F (36.9 C)  TempSrc: Oral Oral  Oral  SpO2: 93% 94%  96%  Weight:   83.2 kg   Height:        Intake/Output Summary (Last 24 hours) at 04/20/2018 1209 Last data filed at 04/20/2018 0818 Gross per 24 hour  Intake 670 ml  Output 1500 ml   Net -830 ml   Filed Weights   04/18/18 0357 04/19/18 0327 04/20/18 0347  Weight: 88.2 kg 83.8 kg 83.2 kg    Telemetry    Atrial flutter 2:1 with RVR - Personally Reviewed  ECG  No new EKG to review - Personally Reviewed  Physical Exam   GEN: Well nourished, well developed in no acute distress HEENT: Normal NECK: No JVD; No carotid bruits LYMPHATICS: No lymphadenopathy CARDIAC:irregular and tachycardic, no murmurs, rubs, gallops RESPIRATORY:  Clear to auscultation without rales, wheezing or rhonchi  ABDOMEN: Soft, non-tender, non-distended MUSCULOSKELETAL:  No edema; No deformity  SKIN: Warm and dry NEUROLOGIC:  Alert and oriented x 3 PSYCHIATRIC:  Normal affect    Labs    Chemistry Recent Labs  Lab 04/17/18 0608 04/18/18 0324 04/20/18 0305  NA 139 139 137  K 3.6 3.5 3.4*  CL 105 101 100  CO2 22 25 24   GLUCOSE 111* 105* 98  BUN 13 9 11   CREATININE 1.06 0.87 0.95  CALCIUM 7.9* 7.7* 8.1*  GFRNONAA >60 >60 >60  GFRAA >60 >60 >60  ANIONGAP 12 13 13      Hematology Recent Labs  Lab 04/16/18 2350  04/17/18 0608 04/18/18 0324  WBC 14.8* 14.8* 12.8*  RBC 3.51* 3.74* 3.80*  HGB 10.4* 10.7* 10.9*  HCT 30.6* 33.1* 33.4*  MCV 87.2 88.5 87.9  MCH 29.6 28.6 28.7  MCHC 34.0 32.3 32.6  RDW 12.6 12.7 12.8  PLT 226 248 238    Cardiac EnzymesNo results for input(s): TROPONINI in the last 168 hours. No results for input(s): TROPIPOC in the last 168 hours.   BNPNo results for input(s): BNP, PROBNP in the last 168 hours.   DDimer No results for input(s): DDIMER in the last 168 hours.   Radiology    Dg Chest 2 View  Result Date: 04/19/2018 CLINICAL DATA:  Atelectasis EXAM: CHEST - 2 VIEW COMPARISON:  Two days ago FINDINGS: Unchanged streaky density in the right chest with volume loss. No evidence of pneumothorax. No pulmonary edema. Trace right pleural effusion. Stable postoperative heart size. IMPRESSION: 1. No pneumothorax after chest tube removal. 2. Asymmetric  right-sided atelectasis. Electronically Signed   By: Monte Fantasia M.D.   On: 04/19/2018 09:24    Cardiac Studies   Echo 04/10/2018 LV EF: 55% - 60%  Study Conclusions  - Left ventricle: The cavity size was normal. Wall thickness was normal. Systolic function was normal. The estimated ejection fraction was in the range of 55% to 60%. Wall motion was normal; there were no regional wall motion abnormalities. - Left atrium: There is a medium-sized mobilemyxoma attached to the fossa ovalis. - Right atrium: No evidence of thrombus in the atrial cavity or appendage. - Atrial septum: No defect or patent foramen ovale was identified.   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 These usually run posterior to the aorta and not intra-arterial. We were unable to confirm the course.  LAD contains mid 40% eccentric narrowing.  Right coronary is dominant and without obstruction.  Normal LVEDP.  No tumor blush is noted, hence possibly explaining fragmentation of the myxoma leading to multiple emboli.  Tachycardia and hypotension secondary to beta-blocker withdrawal  RECOMMENDATIONS:   Resume IV heparin in 4 hours without bolus.  Consider adding beta-blocker therapy to solve beta-blocker withdrawal.    Myxoma resection by Dr. Roxy Manns 04/15/2018  Minimally-InvasiveResection Left Atrial Myxoma Ultrasound-guided percutaneous arterial and venous access for cardiopulmonary bypass Right miniature anterior thoracotomy Autologous pericardial patch closure of interatrial septum  Patient Profile     63 y.o. male with PMH of HTN and HLD presented on 1/29 with acute CVA 2/2 to emboli. 2D echo showed 2.2x1.3 cm pedunculated LA mass at the fossa ovalis c/w myxoma. Confirmed on TEE 04/10/2018. Underwent diagnostic cath 04/11/2018 which showed anomalous origin of LCx which supplies a  small territory on the L lateral wall, otherwise widely patent coronary arteries. Patient underwent minimally invasive resection of LA myxoma.   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 Postop atrial flutter - started at exactly 1:00AM on 04/18/2017, currently on IV amiodarone.  - metoprolol increased to 50mg  TID yesterday. ( Note, he was on 200mg  daily Toprol XL prior to admission) - unfortunately he has 2:1 atrial flutter with HR 120s, this is very resistant toward rate control.  -On telemetry yesterday  he had a 5-second post conversion pause with 2 normal sinus beats and then went back into atrial flutter with RVR -despite increasing BB and IV Amio remains difficult to control rate in aflutter -make NPO after MN -continue Warfarin load -  INR 1.17.   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 HTN -BP remains elevated at 130/159mmHg today  -increase metoprolol to 100mg  BID (was on Toprol XL 200mg  daily at home)  5. HLD -continue home does of pravastatin      For questions or updates, please contact [place] Please consult [email or web address] for contact info under Cardiology/STEMI.      Signed, Fransico Him, MD  04/20/2018, 12:08 PM

## 2018-04-20 NOTE — Progress Notes (Addendum)
      [address]       [place] [address]             [phone]        5 Days Post-Op Procedure(s) (LRB): MINIMALLY INVASIVE EXCISION OF LEFT ATRIAL MYXOMA (N/A) TRANSESOPHAGEAL ECHOCARDIOGRAM (TEE) (N/A)  Subjective: Patient without complaints this am. He still has expressive aphasia/dysarthria but is able to speak in more sentences  Objective: Vital signs in last 24 hours: Temp:  [97.8 F (36.6 C)-98.9 F (37.2 C)] 98.5 F (36.9 C) (02/09 0359) Pulse Rate:  [126-133] 132 (02/09 0359) Cardiac Rhythm: Atrial flutter (02/09 0330) Resp:  [18-28] 19 (02/09 0359) BP: (116-142)/(96-101) 130/100 (02/09 0359) SpO2:  [92 %-96 %] 96 % (02/09 0359) Weight:  [83.2 kg] 83.2 kg (02/09 0347)  Pre op weight  89 kg Current Weight  04/20/18 83.2 kg      Intake/Output from previous day: 02/08 0701 - 02/09 0700 In: 670 [P.O.:480; I.V.:190] Out: 1900 [Urine:1900]   Physical Exam:  Cardiovascular: IRRR IRRR Pulmonary: Slightly diminished right base Abdomen: Soft, non tender, bowel sounds present. Extremities: Trace bilateral lower extremity edema. Wounds: Wound is clean and dry. Right groin is clean and dry  Lab Results: CBC: Recent Labs    04/18/18 0324  WBC 12.8*  HGB 10.9*  HCT 33.4*  PLT 238   BMET:  Recent Labs    04/18/18 0324 04/20/18 0305  NA 139 137  K 3.5 3.4*  CL 101 100  CO2 25 24  GLUCOSE 105* 98  BUN 9 11  CREATININE 0.87 0.95  CALCIUM 7.7* 8.1*    PT/INR:  Lab Results  Component Value Date   INR 1.17 04/20/2018   INR 1.10 04/19/2018   INR 1.41 04/15/2018   ABG:  INR: Will add last result for INR, ABG once components are confirmed Will add last 4 CBG results once components are confirmed  Assessment/Plan:  1. CV - Initially a fib/2:1 a flutter with RVR of late. He also had a few pauses Friday night (longest about 5 seconds). Hypertensive. He is on Amiodarone drip and Lopressor 50 mg tid, Amlodipine 5 mg daily, and  Coumadin (first dose last evening). Likely needs ACR/ARB for better BP control. Cardiology  following;likely ask EP to evaluate. INR this am 1.17. Will increase Coumadin to 5 mg this evening.  2.  Pulmonary - On room air. . Encourage incentive spirometer 3. Volume Overload - On Lasix 40 mg daily. Likely stop after today's dose 4.  Acute blood loss anemia - H and H stable at 10.9 and 33.4 5. Supplement potassium  Donielle M ZimmermanPA-C 04/20/2018,7:26 AM [phone] Patient seen and examined, agree with above Still in flutter with rate 130 INR yet to increase Will change amiodarone to Po  Walton C. Roxan Hockey, MD [facility] [phone]

## 2018-04-21 ENCOUNTER — Inpatient Hospital Stay (HOSPITAL_COMMUNITY): Payer: BLUE CROSS/BLUE SHIELD

## 2018-04-21 LAB — BASIC METABOLIC PANEL
Anion gap: 10 (ref 5–15)
BUN: 13 mg/dL (ref 8–23)
CO2: 24 mmol/L (ref 22–32)
CREATININE: 1.2 mg/dL (ref 0.61–1.24)
Calcium: 8.2 mg/dL — ABNORMAL LOW (ref 8.9–10.3)
Chloride: 105 mmol/L (ref 98–111)
GFR calc Af Amer: >60 mL/min (ref >60)
GFR calc non Af Amer: >60 mL/min (ref >60)
Glucose, Bld: 107 mg/dL — ABNORMAL HIGH (ref 70–99)
Potassium: 4.4 mmol/L (ref 3.5–5.1)
Sodium: 139 mmol/L (ref 135–145)

## 2018-04-21 LAB — PROTIME-INR
INR: 1.1
Prothrombin Time: 14.1 seconds (ref 11.4–15.2)

## 2018-04-21 MED ORDER — APIXABAN 5 MG PO TABS
5.0000 mg | ORAL_TABLET | Freq: Two times a day (BID) | ORAL | Status: DC
  Administered 2018-04-21 – 2018-04-23 (×5): 5 mg via ORAL
  Filled 2018-04-21 (×5): qty 1

## 2018-04-21 MED FILL — Potassium Chloride Inj 2 mEq/ML: INTRAVENOUS | Qty: 40 | Status: AC

## 2018-04-21 MED FILL — Heparin Sodium (Porcine) Inj 1000 Unit/ML: INTRAMUSCULAR | Qty: 30 | Status: AC

## 2018-04-21 MED FILL — Mannitol IV Soln 20%: INTRAVENOUS | Qty: 1000 | Status: AC

## 2018-04-21 MED FILL — Magnesium Sulfate Inj 50%: INTRAMUSCULAR | Qty: 10 | Status: AC

## 2018-04-21 MED FILL — Sodium Chloride IV Soln 0.9%: INTRAVENOUS | Qty: 2000 | Status: AC

## 2018-04-21 MED FILL — Sodium Bicarbonate IV Soln 8.4%: INTRAVENOUS | Qty: 50 | Status: AC

## 2018-04-21 MED FILL — Electrolyte-R (PH 7.4) Solution: INTRAVENOUS | Qty: 4000 | Status: AC

## 2018-04-21 MED FILL — Lidocaine HCl(Cardiac) IV PF Soln Pref Syr 100 MG/5ML (2%): INTRAVENOUS | Qty: 25 | Status: AC

## 2018-04-21 NOTE — Progress Notes (Signed)
CARDIAC REHAB PHASE I   PRE:  Rate/Rhythm: 131 aflutter  BP:  Supine:   Sitting: 114/96  Standing:    SaO2: 97%RA  MODE:  Ambulation: 470 ft   POST:  Rate/Rhythm: 130s aflutter  BP:  Supine:   Sitting: 112/96  Standing:    SaO2: 95%RA 1330-1350 Pt walked 470 ft on RA with rolling walker and asst x 2. Tolerated well. HR remains 130s. Pt glad to be up walking. To recliner after walk. Sister in room. Can be asst x 1.    Graylon Good, RN BSN  04/21/2018 1:47 PM

## 2018-04-21 NOTE — Progress Notes (Signed)
04/21/18 1117  PT Visit Information  Last PT Received On 04/21/18  Assistance Needed +1  History of Present Illness Pt is a 63 y/o male admitted secondary to expressive aphasia. 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 Pt is undergoing pre-op work up for CABG for resection of myxoma, s/p resection of myoxoma.  PMH including but not limited to hypertension, hyperlipidemia   Subjective Data  Patient Stated Goal get stronger  Precautions  Precautions Fall;Other (comment) (sternal precautions)  Precaution Comments sternal precautions, monitor HR  Restrictions  Weight Bearing Restrictions No  Pain Assessment  Pain Assessment No/denies pain  Cognition  Arousal/Alertness Awake/alert  Behavior During Therapy WFL for tasks assessed/performed  Overall Cognitive Status Impaired/Different from baseline  Area of Impairment Problem solving  Memory Decreased short-term memory  Following Commands Follows one step commands consistently;Follows multi-step commands with increased time  Safety/Judgement Decreased awareness of deficits  Problem Solving Difficulty sequencing;Requires verbal cues;Slow processing  General Comments Pt requiring increased time and cues for safety. Pt pleasant throughout. Requiring increased time as well for aphasia  Bed Mobility  General bed mobility comments Patient in chair upon arrival  Transfers  Overall transfer level Needs assistance  Equipment used Rolling walker (2 wheeled)  Transfers Sit to/from Stand  Sit to [place] transfer comment Patient requried supervision to stand with RW for safety. Required increase time to stand and cues to maintain sternal precautions  Ambulation/Gait  Ambulation/Gait assistance Supervision;Min guard  Gait Distance (Feet) 200 Feet  Assistive device Rolling walker (2 wheeled)  Gait Pattern/deviations Step-through pattern;Decreased step length -  right;Decreased step length - left;Decreased stride length;Drifts right/left  General Gait Details Patient ambulated with supervision-min guard with use of RW for safety. Patient requring increased time to respond to cues during ambulation and with noticable difficulty navigating around obstacles. Cues for proximity to RW and for sequencing.   Gait velocity decreased  Gait velocity interpretation 1.31 - 2.62 ft/sec, indicative of limited community ambulator  Balance  Overall balance assessment Needs assistance  Sitting-balance support Feet supported  Sitting balance-Leahy Scale Good  Standing balance support Bilateral upper extremity supported  Standing balance-Leahy Scale Poor  Standing balance comment reliant on BUE support and use of RW for standing balance  General Comments  General comments (skin integrity, edema, etc.) RN cleared patient for mobility and delievered medications to help manage elevated HR. HR ranged from 129-137bpm during entire session.   Exercises  Exercises General Lower Extremity  General Exercises - Lower Extremity  Long Arc Quad AROM;Both;10 reps;Seated  Hip Flexion/Marching AROM;Both;10 reps;Seated  PT - End of Session  Equipment Utilized During Treatment Gait belt  Activity Tolerance Patient tolerated treatment well  Patient left in chair;with call bell/phone within reach  Nurse Communication Mobility status;Other (comment) (HR during mobility)   PT - Assessment/Plan  PT Plan Current plan remains appropriate  PT Visit Diagnosis Other abnormalities of gait and mobility (R26.89);Other symptoms and signs involving the nervous system (R29.898)  PT Frequency (ACUTE ONLY) Min 4X/week  Follow Up Recommendations CIR  PT equipment None recommended by PT  AM-PAC PT "6 Clicks" Mobility Outcome Measure (Version 2)  Help needed turning from your back to your side while in a flat bed without using bedrails? 4  Help needed moving from lying on your back to sitting on the  side of a flat bed without using bedrails? 4  Help needed moving to and from a bed  to a chair (including a wheelchair)? 4  Help needed standing up from a chair using your arms (e.g., wheelchair or bedside chair)? 4  Help needed to walk in hospital room? 3  Help needed climbing 3-5 steps with a railing?  2  6 Click Score 21  Consider Recommendation of Discharge To: Home with no services  PT Goal Progression  Progress towards PT goals Progressing toward goals  Acute Rehab PT Goals  PT Goal Formulation With patient  Time For Goal Achievement 04/24/18  Potential to Achieve Goals Good  PT Time Calculation  PT Start Time (ACUTE ONLY) 1054  PT Stop Time (ACUTE ONLY) 1113  PT Time Calculation (min) (ACUTE ONLY) 19 min  PT General Charges  $$ ACUTE PT VISIT 1 Visit  PT Treatments  $Gait Training 8-22 mins   Patient progressing towards goals. RN cleared patient for mobility due to elevated HR. HR ranging from 129-137bpm during rest and functional mobility. Patient ambulated with supervision to min guard and use of RW for safety. Patient with decreased safety awareness and difficulty navigating around objects with RW. Patient also requires increased time to respond to cues and continues to have difficulty with word finding. Educated and reviewed seated HEP and sternal precautions with patient. Current recommendation remains appropriate due to functional mobility deficits and poor safety awareness.Patient will continue to benefit from acute physical therapy to maximize independence and safety with functional mobility.  Erick Blinks, SPT

## 2018-04-21 NOTE — Progress Notes (Signed)
Nutrition Follow-up  DOCUMENTATION CODES:   Not applicable  INTERVENTION:     Ensure Enlive po BID, each supplement provides 350 kcal and 20 grams of protein  NUTRITION DIAGNOSIS:   Increased nutrient needs related to other (see comment)(surgery) as evidenced by estimated needs; ongoing  GOAL:   Patient will meet greater than or equal to 90% of their needs; progressing  MONITOR:   PO intake, Supplement acceptance, Labs, Weight trends, I & O's, Skin  ASSESSMENT:   Pt is a 63 yo male with PMH of HTN and dyslipidemia. Presented to [facility] with dysarthria, expressive aphasia on 1/28. Workup revealed multiple infarcts and myxoma in left atrium. Transferred to [facility] 1/29.  1/31 s/p LEFT HEART CATH AND CORONARY ANGIOGRAPHY  2/04 s/p MINIMALLY INVASIVE EXCISION OF LEFT ATRIAL MYXOMA   Pt currently on Heart Healthy diet. PO intake excellent at 100% per flowsheet records. Speech Path following for cognitive-linguistics. Labs & medications reviewed. Plan for DCCV today.  Diet Order:   Diet Order            Diet NPO time specified Except for: Sips with Meds  Diet effective midnight        Diet Heart Room service appropriate? Yes; Fluid consistency: Thin  Diet effective now             EDUCATION NEEDS:   No education needs have been identified at this time  Skin:  Skin Assessment: Reviewed RN Assessment  Last BM:  2/9  Height:   Ht Readings from Last 1 Encounters:  04/14/18 5\' 3"  (1.6 m)   Weight:   Wt Readings from Last 1 Encounters:  04/21/18 82.7 kg   Ideal Body Weight:  59.09 kg  BMI:  Body mass index is 32.29 kg/m.  Estimated Nutritional Needs:   Kcal:  1800-2000  Protein:  90-105 gm  Fluid:  1.8-2.0 L  Arthur Holms, RD, LDN Pager #: [phone] After-Hours Pager #: [phone]

## 2018-04-21 NOTE — Consult Note (Addendum)
Cardiology Consultation:   Patient ID: Shawn Mccann MRN: [id number]; DOB: 27-Jun-1955  Admit date: 04/09/2018 Date of Consult: 04/21/2018  Primary Care Provider: Ocie Doyne., MD Primary Cardiologist: Fransico Him, MD  Primary Electrophysiologist:  None    Patient Profile:   Shawn Mccann is a 63 y.o. male with a hx of HTN only until this hospital stay who is being seen today for the evaluation of AFlutter w/RVR at the request of DR. Turner.  History of Present Illness:   [name] was admitted with symptoms of aphasia and dx with CVA, ER where a head CT showed an acute CVA.  MRI of the brain revealed strokes in multiple distributions but predominantly left cerebral hemisphere but also in the right anterior and posterior circulation on the left.  stroke w/u included TTE that noted a 2.2 x 1.3cm pedunculated mass in LA attached to the IAS by a stalk in the area of the fossa ovalis c/w left atrial myxoma. 04/10/2018, TEE confirmed Left atrial mass typical for myxoma, pedunculated, mobile, heterogenous, attached to the fossa ovalis, measuring 1.9 cm x 1.3 cm, otherwise, normal TEE. 04/11/2018 LHC without obstructive CAD 04/15/2018 he underwent Minimally-Invasive Resection Left Atrial Myxoma. Post-operative 2/5 card notes report A pacing, SB 50's > SR >> on 2/7 reports 2:1 AFlutter He was observed to have a 5 sec post-conversion pause though with ERAF only after a few sinus beats  He was started on amio gtt, his  Metoprolol up-titrated to 50 TID and warfarin Amio  > 200mg  PO BID yesterday, getting intermittent IV lopressor doses On lovenox DVT dose, not fully a/c  He is not on heparin gtt and INR subtherapeutic, NPO for poss TEE/DCCV though in d/w endo, no available slots anyway  LABS K+ 4.4 BUN/Creat  INR1.10  The patient is observed to be at the sink, brushing his teeth ambulated to chair without difficulty, no SOB He is unaware of his tachycardia, denies ant palpitations or CP, no  cardiac awareness He does have some degree of aphasia, though is clearly able to tell me yes and no, and short phrases.  He asks appropriately about DCCV and how this is done, he has had TEE already.   Past Medical History:  Diagnosis Date  . Atrial myxoma 04/08/2018  . CVA (cerebral vascular accident) ([place]) 04/08/2018  . Dyslipidemia   . Hypertension   . s/p minimally invasive resection of left atrial myxoma 04/15/2018    Past Surgical History:  Procedure Laterality Date  . LEFT HEART CATH AND CORONARY ANGIOGRAPHY N/A 04/11/2018   Procedure: LEFT HEART CATH AND CORONARY ANGIOGRAPHY;  Surgeon: Belva Crome, MD;  Location: [facility];  Service: Cardiovascular;  Laterality: N/A;  . MINIMALLY INVASIVE EXCISION OF ATRIAL MYXOMA N/A 04/15/2018   Procedure: MINIMALLY INVASIVE EXCISION OF LEFT ATRIAL MYXOMA;  Surgeon: Rexene Alberts, MD;  Location: [facility];  Service: Open Heart Surgery;  Laterality: N/A;  . NO PAST SURGERIES    . TEE WITHOUT CARDIOVERSION N/A 04/10/2018   Procedure: TRANSESOPHAGEAL ECHOCARDIOGRAM (TEE);  Surgeon: Sanda Klein, MD;  Location: [facility];  Service: Cardiovascular;  Laterality: N/A;  . TEE WITHOUT CARDIOVERSION N/A 04/15/2018   Procedure: TRANSESOPHAGEAL ECHOCARDIOGRAM (TEE);  Surgeon: Rexene Alberts, MD;  Location: [facility];  Service: Open Heart Surgery;  Laterality: N/A;     Home Medications:  Prior to Admission medications   Medication Sig Start Date End Date Taking? Authorizing Provider  acetaminophen (TYLENOL) 325 MG tablet Take 650 mg by  mouth every 6 (six) hours as needed for mild pain or headache.   Yes [provider]  metoprolol (TOPROL-XL) 200 MG 24 hr tablet Take 200 mg by mouth daily. 02/28/18  Yes [provider]  pravastatin (PRAVACHOL) 80 MG tablet Take 80 mg by mouth daily. 02/28/18  Yes [provider]    Inpatient Medications: Scheduled Meds: . amiodarone  200 mg Oral BID  . amLODipine  5 mg Oral Daily  .  aspirin EC  81 mg Oral Daily  . atorvastatin  40 mg Oral q1800  . bisacodyl  10 mg Oral Daily   Or  . bisacodyl  10 mg Rectal Daily  . Chlorhexidine Gluconate Cloth  6 each Topical Daily  . docusate sodium  200 mg Oral Daily  . enoxaparin (LOVENOX) injection  40 mg Subcutaneous Q24H  . furosemide  40 mg Oral Daily  . [place] Blood Cardioplegia (KBC) lidocaine 2% Syringe (6mL)  13 mL Intracoronary Once  . [place] Blood Cardioplegia (KBC) mannitol 20% Syringe (28mL)  32 mL Intracoronary Once  . lactulose  20 g Oral Once  . mouth rinse  15 mL Mouth Rinse BID  . metoprolol tartrate  50 mg Oral TID  . pantoprazole  40 mg Oral Daily  . potassium chloride  40 mEq Oral BID  . sodium chloride flush  3 mL Intravenous Q12H  . warfarin  5 mg Oral q1800  . Warfarin - Physician Dosing Inpatient   Does not apply q1800   Continuous Infusions: . sodium chloride Stopped (04/16/18 1530)  . lactated ringers Stopped (04/16/18 1017)   PRN Meds: metoprolol tartrate, morphine injection, ondansetron (ZOFRAN) IV, oxyCODONE, sodium chloride flush, traMADol  Allergies:   No Known Allergies  Social History:   Social History   Socioeconomic History  . Marital status: Single    Spouse name: Not on file  . Number of children: Not on file  . Years of education: Not on file  . Highest education level: Not on file  Occupational History  . Occupation: Games developer  Social Needs  . Financial resource strain: Not on file  . Food insecurity:    Worry: Not on file    Inability: Not on file  . Transportation needs:    Medical: Not on file    Non-medical: Not on file  Tobacco Use  . Smoking status: Never Smoker  . Smokeless tobacco: Never Used  Substance and Sexual Activity  . Alcohol use: Yes    Comment: occasional  . Drug use: Never  . Sexual activity: Not on file  Lifestyle  . Physical activity:    Days per week: Not on file    Minutes per session: Not on file  . Stress: Not on file    Relationships  . Social connections:    Talks on phone: Not on file    Gets together: Not on file    Attends religious service: Not on file    Active member of club or organization: Not on file    Attends meetings of clubs or organizations: Not on file    Relationship status: Not on file  . Intimate partner violence:    Fear of current or ex partner: Not on file    Emotionally abused: Not on file    Physically abused: Not on file    Forced sexual activity: Not on file  Other Topics Concern  . Not on file  Social History Narrative  . Not on file  Family History:   Family History  Problem Relation Age of Onset  . Lung cancer Mother 53  . CAD Father 25  . Hypertension Sister   . Hypertension Brother   . CVA Maternal Grandfather   . CVA Other      ROS:  Please see the history of present illness.  All other ROS reviewed and negative.     Physical Exam/Data:   Vitals:   04/20/18 2346 04/21/18 0338 04/21/18 0547 04/21/18 0727  BP:    (!) 140/107  Pulse: (!) 134   (!) 126  Resp:    18  Temp: 98 F (36.7 C) 98.2 F (36.8 C)  97.9 F (36.6 C)  TempSrc: Oral Oral  Oral  SpO2:  94%  93%  Weight:   82.7 kg   Height:        Intake/Output Summary (Last 24 hours) at 04/21/2018 0744 Last data filed at 04/20/2018 2353 Gross per 24 hour  Intake 480 ml  Output 1250 ml  Net -770 ml   Last 3 Weights 04/21/2018 04/20/2018 04/19/2018  Weight (lbs) 182 lb 4.8 oz 183 lb 6.8 oz 184 lb 11.9 oz  Weight (kg) 82.691 kg 83.2 kg 83.8 kg     Body mass index is 32.29 kg/m.  General:  Well nourished, well developed, in no acute distress HEENT: normal Lymph: no adenopathy Neck: no JVD Endocrine:  No thryomegaly Vascular: No carotid bruits  Cardiac:  tachycardic no murmurs, gallops or rubs Lungs:  CTA b/l, no wheezing, rhonchi or rales  Abd: soft, nontender  Ext: no edema Musculoskeletal:  No deformities Skin: warm and dry  Neuro:  Aphasia, though able to get some things out I time,  no gross focal abnormalities noted otherwise Psych:  Normal affect   EKG:  The EKG was personally reviewed and demonstrates:    #1 is AP/Vsensed #2 is SB 56bpm,  #3 appears an accel junctional rhythm, 61bpm  Telemetry:  Telemetry was personally reviewed and demonstrates:   AFlutter 120's-130's, 140's with exertion  Relevant CV Studies:  04/15/2018: TEE Result status: Final result   Septum: No Patent Foramen Ovale present.  Left atrium: Patent foramen ovale not present.  Left atrium: Small pedunculated mass present consistent with myxoma. The mass is mobile and located on the interatrial septum.  Aortic valve: No AV vegetation. No evidence of papillary fibroelastoma.  Mitral valve: No leaflet thickening and calcification present. Trace regurgitation.  Right ventricle: Normal cavity size, wall thickness and ejection fraction. No thrombus present. No mass present.   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  These usually run posterior to the aorta and not intra-arterial.  We were unable to confirm the course.  LAD contains mid 40% eccentric narrowing.  Right coronary is dominant and without obstruction.  Normal LVEDP.  No tumor blush is noted, hence possibly explaining fragmentation of the myxoma leading to multiple emboli.  Tachycardia and hypotension secondary to beta-blocker withdrawal  RECOMMENDATIONS:   Resume IV heparin in 4 hours without bolus.  Consider adding beta-blocker therapy to solve beta-blocker withdrawal.1  Laboratory Data:  Chemistry Recent Labs  Lab 04/18/18 0324 04/20/18 0305 04/21/18 0325  NA 139 137 139  K 3.5 3.4* 4.4  CL 101 100 105  CO2 25 24 24   GLUCOSE 105* 98 107*  BUN 9 11 13   CREATININE 0.87 0.95 1.20  CALCIUM 7.7* 8.1* 8.2*  GFRNONAA >60 >60 >60  GFRAA >60 >60 >  60  ANIONGAP 13 13 10     No results for input(s): PROT, ALBUMIN, AST, ALT, ALKPHOS, BILITOT in the  last 168 hours. Hematology Recent Labs  Lab 04/16/18 2350 04/17/18 0608 04/18/18 0324  WBC 14.8* 14.8* 12.8*  RBC 3.51* 3.74* 3.80*  HGB 10.4* 10.7* 10.9*  HCT 30.6* 33.1* 33.4*  MCV 87.2 88.5 87.9  MCH 29.6 28.6 28.7  MCHC 34.0 32.3 32.6  RDW 12.6 12.7 12.8  PLT 226 248 238   Cardiac EnzymesNo results for input(s): TROPONINI in the last 168 hours. No results for input(s): TROPIPOC in the last 168 hours.  BNPNo results for input(s): BNP, PROBNP in the last 168 hours.  DDimer No results for input(s): DDIMER in the last 168 hours.  Radiology/Studies:   Dg Chest 2 View Result Date: 04/19/2018 CLINICAL DATA:  Atelectasis EXAM: CHEST - 2 VIEW COMPARISON:  Two days ago FINDINGS: Unchanged streaky density in the right chest with volume loss. No evidence of pneumothorax. No pulmonary edema. Trace right pleural effusion. Stable postoperative heart size. IMPRESSION: 1. No pneumothorax after chest tube removal. 2. Asymmetric right-sided atelectasis. Electronically Signed   By: Monte Fantasia M.D.   On: 04/19/2018 09:24    Assessment and Plan:   1. Post-op AFlutter     EKG today to see better, not likely typical  I have discussed the case with Dr. Erlinda Hong and CTS, both are ok with full anticoagulation, (Dr. Roxy Manns suggests [facility] recommends Eliquis, Dr. Erlinda Hong is OK with this), will start now this AM to get a few doses in by tomorrow Will plan for TEE/DCCV tomorrow   Rate is poorly controlled, though not likely to improve until DCCV unfortunately  He had reports of post conversion pause of 5 sec w/ERAF Post op- had A pacing , last EKG looks an accel junctional  I will discuss with Dr. Lovena Le, may hold lopressor tomorrow pre-DCCV    Continue care otherwise with CTS service     For questions or updates, please contact [facility] Please consult [email or web address] for contact info under     Signed, Baldwin Jamaica, PA-C  04/21/2018 7:44 AM  EP Attending  Patient seen and examined.  Agree with above. The patient remains in atrial flutter with very difficult to control rates. Agree with repeat TEE/DCCV and hopefully on amiodarone will maintain NSR.   Mikle Bosworth.D.

## 2018-04-21 NOTE — Care Management (Signed)
#  9.   S/W  KATHY   @   [phone]   ELIQUIS  5 MG BID COVER- YES CO-PAY- $ 60.00 TIER- NO PRIOR APPROVAL- NO  DEDUCTIBLE: NOT MET OUT-OF - POCKET: NOT MET  PREFERRED PHARMACY : YES [facility] 90 DAY SUPPLY FOR RETAIL $ 180.00

## 2018-04-21 NOTE — Progress Notes (Addendum)
      [address]       [place] [address]             [phone]     CARDIOTHORACIC SURGERY PROGRESS NOTE  6 Days Post-Op  S/P Procedure(s) (LRB): MINIMALLY INVASIVE EXCISION OF LEFT ATRIAL MYXOMA (N/A) TRANSESOPHAGEAL ECHOCARDIOGRAM (TEE) (N/A)  Subjective: No complaints  Objective: Vital signs in last 24 hours: Temp:  [97.9 F (36.6 C)-98.2 F (36.8 C)] 97.9 F (36.6 C) (02/10 0727) Pulse Rate:  [126-138] 126 (02/10 0727) Cardiac Rhythm: Atrial flutter (02/10 0600) Resp:  [17-22] 18 (02/10 0727) BP: (120-140)/(77-107) 140/107 (02/10 0727) SpO2:  [93 %-98 %] 93 % (02/10 0727) Weight:  [82.7 kg] 82.7 kg (02/10 0547)  Physical Exam:  Rhythm:   Aflutter  Breath sounds: clear  Heart sounds:  tachy  Incisions:  Clean and dry  Abdomen:  Soft, non-distended, non-tender  Extremities:  Warm, well-perfused    Intake/Output from previous day: 02/09 0701 - 02/10 0700 In: 480 [P.O.:480] Out: 1250 [Urine:1250] Intake/Output this shift: No intake/output data recorded.  Lab Results: No results for input(s): WBC, HGB, HCT, PLT in the last 72 hours. BMET:  Recent Labs    04/20/18 0305 04/21/18 0325  NA 137 139  K 3.4* 4.4  CL 100 105  CO2 24 24  GLUCOSE 98 107*  BUN 11 13  CREATININE 0.95 1.20  CALCIUM 8.1* 8.2*    CBG (last 3)  No results for input(s): GLUCAP in the last 72 hours. PT/INR:   Recent Labs    04/21/18 0325  LABPROT 14.1  INR 1.10    CXR:  Looks great.  Very mild RLL opacity  Assessment/Plan: S/P Procedure(s) (LRB): MINIMALLY INVASIVE EXCISION OF LEFT ATRIAL MYXOMA (N/A) TRANSESOPHAGEAL ECHOCARDIOGRAM (TEE) (N/A)  Remains in Aflutter w/ 2:1 block, otherwise looks fine I agree w/ plans for DCCV today INR still hasn't bumped Heparin in room but never got started for unclear reasons - will ask pharmacy to dose Could consider stopping Coumadin and starting Eliquis if Neurology team has no objections  Rexene Alberts,  MD 04/21/2018 8:46 AM

## 2018-04-21 NOTE — Progress Notes (Signed)
Occupational Therapy Treatment Patient Details Name: Shawn Mccann MRN: [id number] DOB: January 19, 1956 Today's Date: 04/21/2018    History of present illness Pt is a 63 y/o male admitted secondary to expressive aphasia. 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 Pt is undergoing pre-op work up for CABG for resection of myxoma, s/p resection of myoxoma.  PMH including but not limited to hypertension, hyperlipidemia    OT comments  Pt progressing towards established OT goals; however, limited to in room mobility due to elevated HR. Pt performing grooming at sink with Min Guard A for safety. Returned to recliner and pt completed grooming activities with set up. HR remaining in 140s throughout session (both at rest and standing). Pt requiring increased time and cues during session and continues to present with word finding difficulties. Educating pt on compensatory techniques for LB dressing to increase safety and independence. Pt very motivated to participate in therapy. Pt planning for possible procedure tomorrow. Continue to recommend dc to CIR and will continue to follow acutely as admitted.    Follow Up Recommendations  CIR    Equipment Recommendations  3 in 1 bedside commode    Recommendations for Other Services      Precautions / Restrictions Precautions Precautions: Fall;Other (comment)(Sternal precautions for comfort.) Precaution Comments: watch HR       Mobility Bed Mobility Overal bed mobility: Needs Assistance Bed Mobility: Supine to Sit     Supine to sit: Supervision     General bed mobility comments: Pt able to perform with supervision for safety. Adhering to sternal precautions for comfort  Transfers Overall transfer level: Needs assistance Equipment used: Rolling walker (2 wheeled) Transfers: Sit to/from [facility] Sit to Stand: Min guard Stand pivot transfers: Min guard       General  transfer comment: increased time and effort to stand, cues for hand placement    Balance Overall balance assessment: Needs assistance   Sitting balance-Leahy Scale: Fair       Standing balance-Leahy Scale: Fair Standing balance comment: Able to maintain static standing without UE support                           ADL either performed or assessed with clinical judgement   ADL Overall ADL's : Needs assistance/impaired     Grooming: Oral care;Sitting;Set up;Brushing hair;Min guard;Standing(Shaving) Grooming Details (indicate cue type and reason): Pt performing oral care while standing at sink with Min Guard A for safety. Pt brushing his hair and shaving while seated in recliner.  HR stating 130s-140s. Highest 144 during ADLs.               Lower Body Dressing Details (indicate cue type and reason): Educating pt on compensatory techniques to adhere to sternal precautions for comfort. Pt able to bring ankles to knees but with increased time and effort.  Toilet Transfer: Min guard;Ambulation Toilet Transfer Details (indicate cue type and reason): Min Guard A for safety         Functional mobility during ADLs: Min guard General ADL Comments: Focused on ADLs in room due to HR. HR 130s-144. Pt planning for possible procedure today or tomorrow.      Vision   Additional Comments: Pt denies any diplopia or blurry vision   Perception     Praxis      Cognition Arousal/Alertness: Awake/alert Behavior During Therapy: WFL for tasks assessed/performed Overall Cognitive Status: Impaired/Different  from baseline Area of Impairment: Problem solving                             Problem Solving: Difficulty sequencing;Requires verbal cues;Slow processing General Comments: Pt requiring increased time and cues. Pt pleasant throughout. Requiring increased time as well for aphasia        Exercises     Shoulder Instructions       General Comments SpO2 >94% on  RA. HR elevating to 144 at the highest during activity. 130s-140s even at rest    Pertinent Vitals/ Pain       Pain Assessment: Faces Faces Pain Scale: No hurt Pain Intervention(s): Monitored during session;Repositioned  Home Living                                          Prior Functioning/Environment              Frequency  Min 3X/week        Progress Toward Goals  OT Goals(current goals can now be found in the care plan section)  Progress towards OT goals: Progressing toward goals  Acute Rehab OT Goals Patient Stated Goal: get stronger OT Goal Formulation: With patient Time For Goal Achievement: 04/30/18 Potential to Achieve Goals: Good ADL Goals Pt Will Perform Grooming: with min guard assist;standing Pt Will Perform Upper Body Bathing: with supervision;sitting Pt Will Perform Lower Body Bathing: with min guard assist;sit to/from stand Pt Will Perform Upper Body Dressing: with supervision;sitting Pt Will Perform Lower Body Dressing: with min guard assist;with adaptive equipment;sit to/from stand Pt Will Transfer to Toilet: with min guard assist;ambulating;regular height toilet;bedside commode;grab bars Pt Will Perform Toileting - Clothing Manipulation and hygiene: with min guard assist;sit to/from stand  Plan Discharge plan remains appropriate    Co-evaluation                 AM-PAC OT "6 Clicks" Daily Activity     Outcome Measure   Help from another person eating meals?: None Help from another person taking care of personal grooming?: A Little Help from another person toileting, which includes using toliet, bedpan, or urinal?: A Little Help from another person bathing (including washing, rinsing, drying)?: A Lot Help from another person to put on and taking off regular upper body clothing?: A Little Help from another person to put on and taking off regular lower body clothing?: A Lot 6 Click Score: 17    End of Session    OT  Visit Diagnosis: Unsteadiness on feet (R26.81);Pain;Cognitive communication deficit (R41.841) Symptoms and signs involving cognitive functions: Cerebral infarction Pain - Right/Left: Right   Activity Tolerance Patient tolerated treatment well   Patient Left in chair;with call bell/phone within reach   Nurse Communication Mobility status        Time: 0821-0839 OT Time Calculation (min): 18 min  Charges: OT General Charges $OT Visit: 1 Visit OT Treatments $Self Care/Home Management : 8-22 mins  [place], OTR/L Acute Rehab Pager: [phone] Office: [place] 04/21/2018, 9:05 AM

## 2018-04-21 NOTE — Progress Notes (Signed)
  Speech Language Pathology Treatment: Cognitive-Linquistic  Patient Details Name: Shawn Mccann MRN: [id number] DOB: May 03, 1955 Today's Date: 04/21/2018 Time: 2481-8590 SLP Time Calculation (min) (ACUTE ONLY): 23 min  Assessment / Plan / Recommendation Clinical Impression  Pt primarily produces one to two word utterances, requiring Mod cues to generate more complete sentences during verbal sequencing tasks. Mod cues were provided for word-finding in descriptive language task. Pt believes his language has improved some, but he would still be an excellent candidate for CIR to maximize functional communication given independence PTA.   HPI HPI: Pt is a 63 y.o. male with medical history significant of HTN who presented with dysarthria and aphasia; he was found to have multiple infracts on evaluation at [facility] and was transferred to [facility]. 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       SLP Plan  Continue with current plan of care       Recommendations                   Follow up Recommendations: Inpatient Rehab SLP Visit Diagnosis: Aphasia (R47.01) Plan: Continue with current plan of care       GO                Shawn Mccann Shawn Mccann 04/21/2018, 4:20 PM  Shawn Mccann, M.A. [place] Acute Environmental education officer [phone] Office [phone]

## 2018-04-21 NOTE — H&P (View-Only) (Signed)
Cardiology Consultation:   Patient ID: Shawn Mccann MRN: [id number]; DOB: 04/04/1955  Admit date: 04/09/2018 Date of Consult: 04/21/2018  Primary Care Provider: Ocie Doyne., MD Primary Cardiologist: Fransico Him, MD  Primary Electrophysiologist:  None    Patient Profile:   Shawn Mccann is a 63 y.o. male with a hx of HTN only until this hospital stay who is being seen today for the evaluation of AFlutter w/RVR at the request of DR. Turner.  History of Present Illness:   Shawn Mccann was admitted with symptoms of aphasia and dx with CVA, ER where a head CT showed an acute CVA.  MRI of the brain revealed strokes in multiple distributions but predominantly left cerebral hemisphere but also in the right anterior and posterior circulation on the left.  stroke w/u included TTE that noted a 2.2 x 1.3cm pedunculated mass in LA attached to the IAS by a stalk in the area of the fossa ovalis c/w left atrial myxoma. 04/10/2018, TEE confirmed Left atrial mass typical for myxoma, pedunculated, mobile, heterogenous, attached to the fossa ovalis, measuring 1.9 cm x 1.3 cm, otherwise, normal TEE. 04/11/2018 LHC without obstructive CAD 04/15/2018 he underwent Minimally-Invasive Resection Left Atrial Myxoma. Post-operative 2/5 card notes report A pacing, SB 50's > SR >> on 2/7 reports 2:1 AFlutter He was observed to have a 5 sec post-conversion pause though with ERAF only after a few sinus beats  He was started on amio gtt, his  Metoprolol up-titrated to 50 TID and warfarin Amio  > 200mg  PO BID yesterday, getting intermittent IV lopressor doses On lovenox DVT dose, not fully a/c  He is not on heparin gtt and INR subtherapeutic, NPO for poss TEE/DCCV though in d/w endo, no available slots anyway  LABS K+ 4.4 BUN/Creat  INR1.10  The patient is observed to be at the sink, brushing his teeth ambulated to chair without difficulty, no SOB He is unaware of his tachycardia, denies ant palpitations or CP, no  cardiac awareness He does have some degree of aphasia, though is clearly able to tell me yes and no, and short phrases.  He asks appropriately about DCCV and how this is done, he has had TEE already.   Past Medical History:  Diagnosis Date  . Atrial myxoma 04/08/2018  . CVA (cerebral vascular accident) ([place]) 04/08/2018  . Dyslipidemia   . Hypertension   . s/p minimally invasive resection of left atrial myxoma 04/15/2018    Past Surgical History:  Procedure Laterality Date  . LEFT HEART CATH AND CORONARY ANGIOGRAPHY N/A 04/11/2018   Procedure: LEFT HEART CATH AND CORONARY ANGIOGRAPHY;  Surgeon: Belva Crome, MD;  Location: [facility];  Service: Cardiovascular;  Laterality: N/A;  . MINIMALLY INVASIVE EXCISION OF ATRIAL MYXOMA N/A 04/15/2018   Procedure: MINIMALLY INVASIVE EXCISION OF LEFT ATRIAL MYXOMA;  Surgeon: Rexene Alberts, MD;  Location: [facility];  Service: Open Heart Surgery;  Laterality: N/A;  . NO PAST SURGERIES    . TEE WITHOUT CARDIOVERSION N/A 04/10/2018   Procedure: TRANSESOPHAGEAL ECHOCARDIOGRAM (TEE);  Surgeon: Sanda Klein, MD;  Location: [facility];  Service: Cardiovascular;  Laterality: N/A;  . TEE WITHOUT CARDIOVERSION N/A 04/15/2018   Procedure: TRANSESOPHAGEAL ECHOCARDIOGRAM (TEE);  Surgeon: Rexene Alberts, MD;  Location: [facility];  Service: Open Heart Surgery;  Laterality: N/A;     Home Medications:  Prior to Admission medications   Medication Sig Start Date End Date Taking? Authorizing Provider  acetaminophen (TYLENOL) 325 MG tablet Take 650 mg by  mouth every 6 (six) hours as needed for mild pain or headache.   Yes [provider]  metoprolol (TOPROL-XL) 200 MG 24 hr tablet Take 200 mg by mouth daily. 02/28/18  Yes [provider]  pravastatin (PRAVACHOL) 80 MG tablet Take 80 mg by mouth daily. 02/28/18  Yes [provider]    Inpatient Medications: Scheduled Meds: . amiodarone  200 mg Oral BID  . amLODipine  5 mg Oral Daily  .  aspirin EC  81 mg Oral Daily  . atorvastatin  40 mg Oral q1800  . bisacodyl  10 mg Oral Daily   Or  . bisacodyl  10 mg Rectal Daily  . Chlorhexidine Gluconate Cloth  6 each Topical Daily  . docusate sodium  200 mg Oral Daily  . enoxaparin (LOVENOX) injection  40 mg Subcutaneous Q24H  . furosemide  40 mg Oral Daily  . [place] Blood Cardioplegia (KBC) lidocaine 2% Syringe (33mL)  13 mL Intracoronary Once  . [place] Blood Cardioplegia (KBC) mannitol 20% Syringe (69mL)  32 mL Intracoronary Once  . lactulose  20 g Oral Once  . mouth rinse  15 mL Mouth Rinse BID  . metoprolol tartrate  50 mg Oral TID  . pantoprazole  40 mg Oral Daily  . potassium chloride  40 mEq Oral BID  . sodium chloride flush  3 mL Intravenous Q12H  . warfarin  5 mg Oral q1800  . Warfarin - Physician Dosing Inpatient   Does not apply q1800   Continuous Infusions: . sodium chloride Stopped (04/16/18 1530)  . lactated ringers Stopped (04/16/18 1017)   PRN Meds: metoprolol tartrate, morphine injection, ondansetron (ZOFRAN) IV, oxyCODONE, sodium chloride flush, traMADol  Allergies:   No Known Allergies  Social History:   Social History   Socioeconomic History  . Marital status: Single    Spouse name: Not on file  . Number of children: Not on file  . Years of education: Not on file  . Highest education level: Not on file  Occupational History  . Occupation: Games developer  Social Needs  . Financial resource strain: Not on file  . Food insecurity:    Worry: Not on file    Inability: Not on file  . Transportation needs:    Medical: Not on file    Non-medical: Not on file  Tobacco Use  . Smoking status: Never Smoker  . Smokeless tobacco: Never Used  Substance and Sexual Activity  . Alcohol use: Yes    Comment: occasional  . Drug use: Never  . Sexual activity: Not on file  Lifestyle  . Physical activity:    Days per week: Not on file    Minutes per session: Not on file  . Stress: Not on file    Relationships  . Social connections:    Talks on phone: Not on file    Gets together: Not on file    Attends religious service: Not on file    Active member of club or organization: Not on file    Attends meetings of clubs or organizations: Not on file    Relationship status: Not on file  . Intimate partner violence:    Fear of current or ex partner: Not on file    Emotionally abused: Not on file    Physically abused: Not on file    Forced sexual activity: Not on file  Other Topics Concern  . Not on file  Social History Narrative  . Not on file  Family History:   Family History  Problem Relation Age of Onset  . Lung cancer Mother 4  . CAD Father 17  . Hypertension Sister   . Hypertension Brother   . CVA Maternal Grandfather   . CVA Other      ROS:  Please see the history of present illness.  All other ROS reviewed and negative.     Physical Exam/Data:   Vitals:   04/20/18 2346 04/21/18 0338 04/21/18 0547 04/21/18 0727  BP:    (!) 140/107  Pulse: (!) 134   (!) 126  Resp:    18  Temp: 98 F (36.7 C) 98.2 F (36.8 C)  97.9 F (36.6 C)  TempSrc: Oral Oral  Oral  SpO2:  94%  93%  Weight:   82.7 kg   Height:        Intake/Output Summary (Last 24 hours) at 04/21/2018 0744 Last data filed at 04/20/2018 2353 Gross per 24 hour  Intake 480 ml  Output 1250 ml  Net -770 ml   Last 3 Weights 04/21/2018 04/20/2018 04/19/2018  Weight (lbs) 182 lb 4.8 oz 183 lb 6.8 oz 184 lb 11.9 oz  Weight (kg) 82.691 kg 83.2 kg 83.8 kg     Body mass index is 32.29 kg/m.  General:  Well nourished, well developed, in no acute distress HEENT: normal Lymph: no adenopathy Neck: no JVD Endocrine:  No thryomegaly Vascular: No carotid bruits  Cardiac:  tachycardic no murmurs, gallops or rubs Lungs:  CTA b/l, no wheezing, rhonchi or rales  Abd: soft, nontender  Ext: no edema Musculoskeletal:  No deformities Skin: warm and dry  Neuro:  Aphasia, though able to get some things out I time,  no gross focal abnormalities noted otherwise Psych:  Normal affect   EKG:  The EKG was personally reviewed and demonstrates:    #1 is AP/Vsensed #2 is SB 56bpm,  #3 appears an accel junctional rhythm, 61bpm  Telemetry:  Telemetry was personally reviewed and demonstrates:   AFlutter 120's-130's, 140's with exertion  Relevant CV Studies:  04/15/2018: TEE Result status: Final result   Septum: No Patent Foramen Ovale present.  Left atrium: Patent foramen ovale not present.  Left atrium: Small pedunculated mass present consistent with myxoma. The mass is mobile and located on the interatrial septum.  Aortic valve: No AV vegetation. No evidence of papillary fibroelastoma.  Mitral valve: No leaflet thickening and calcification present. Trace regurgitation.  Right ventricle: Normal cavity size, wall thickness and ejection fraction. No thrombus present. No mass present.   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  These usually run posterior to the aorta and not intra-arterial.  We were unable to confirm the course.  LAD contains mid 40% eccentric narrowing.  Right coronary is dominant and without obstruction.  Normal LVEDP.  No tumor blush is noted, hence possibly explaining fragmentation of the myxoma leading to multiple emboli.  Tachycardia and hypotension secondary to beta-blocker withdrawal  RECOMMENDATIONS:   Resume IV heparin in 4 hours without bolus.  Consider adding beta-blocker therapy to solve beta-blocker withdrawal.1  Laboratory Data:  Chemistry Recent Labs  Lab 04/18/18 0324 04/20/18 0305 04/21/18 0325  NA 139 137 139  K 3.5 3.4* 4.4  CL 101 100 105  CO2 25 24 24   GLUCOSE 105* 98 107*  BUN 9 11 13   CREATININE 0.87 0.95 1.20  CALCIUM 7.7* 8.1* 8.2*  GFRNONAA >60 >60 >60  GFRAA >60 >60 >  60  ANIONGAP 13 13 10     No results for input(s): PROT, ALBUMIN, AST, ALT, ALKPHOS, BILITOT in the  last 168 hours. Hematology Recent Labs  Lab 04/16/18 2350 04/17/18 0608 04/18/18 0324  WBC 14.8* 14.8* 12.8*  RBC 3.51* 3.74* 3.80*  HGB 10.4* 10.7* 10.9*  HCT 30.6* 33.1* 33.4*  MCV 87.2 88.5 87.9  MCH 29.6 28.6 28.7  MCHC 34.0 32.3 32.6  RDW 12.6 12.7 12.8  PLT 226 248 238   Cardiac EnzymesNo results for input(s): TROPONINI in the last 168 hours. No results for input(s): TROPIPOC in the last 168 hours.  BNPNo results for input(s): BNP, PROBNP in the last 168 hours.  DDimer No results for input(s): DDIMER in the last 168 hours.  Radiology/Studies:   Dg Chest 2 View Result Date: 04/19/2018 CLINICAL DATA:  Atelectasis EXAM: CHEST - 2 VIEW COMPARISON:  Two days ago FINDINGS: Unchanged streaky density in the right chest with volume loss. No evidence of pneumothorax. No pulmonary edema. Trace right pleural effusion. Stable postoperative heart size. IMPRESSION: 1. No pneumothorax after chest tube removal. 2. Asymmetric right-sided atelectasis. Electronically Signed   By: Monte Fantasia M.D.   On: 04/19/2018 09:24    Assessment and Plan:   1. Post-op AFlutter     EKG today to see better, not likely typical  I have discussed the case with Dr. Erlinda Hong and CTS, both are ok with full anticoagulation, (Dr. Roxy Manns suggests [facility] recommends Eliquis, Dr. Erlinda Hong is OK with this), will start now this AM to get a few doses in by tomorrow Will plan for TEE/DCCV tomorrow   Rate is poorly controlled, though not likely to improve until DCCV unfortunately  He had reports of post conversion pause of 5 sec w/ERAF Post op- had A pacing , last EKG looks an accel junctional  I will discuss with Dr. Lovena Le, may hold lopressor tomorrow pre-DCCV    Continue care otherwise with CTS service     For questions or updates, please contact [facility] Please consult [email or web address] for contact info under     Signed, Baldwin Jamaica, PA-C  04/21/2018 7:44 AM  EP Attending  Patient seen and examined.  Agree with above. The patient remains in atrial flutter with very difficult to control rates. Agree with repeat TEE/DCCV and hopefully on amiodarone will maintain NSR.   Mikle Bosworth.D.

## 2018-04-22 ENCOUNTER — Encounter (HOSPITAL_COMMUNITY): Payer: Self-pay | Admitting: *Deleted

## 2018-04-22 ENCOUNTER — Encounter (HOSPITAL_COMMUNITY)
Admission: AD | Disposition: A | Discharge status: Home Health | Payer: Self-pay | Source: Other Acute Inpatient Hospital | Attending: Thoracic Surgery (Cardiothoracic Vascular Surgery)

## 2018-04-22 ENCOUNTER — Inpatient Hospital Stay (HOSPITAL_COMMUNITY): Payer: BLUE CROSS/BLUE SHIELD

## 2018-04-22 ENCOUNTER — Inpatient Hospital Stay (HOSPITAL_COMMUNITY): Payer: BLUE CROSS/BLUE SHIELD | Admitting: Certified Registered Nurse Anesthetist

## 2018-04-22 DIAGNOSIS — I639 Cerebral infarction, unspecified: Secondary | ICD-10-CM

## 2018-04-22 DIAGNOSIS — I4892 Unspecified atrial flutter: Secondary | ICD-10-CM

## 2018-04-22 HISTORY — PX: CARDIOVERSION: SHX1299

## 2018-04-22 HISTORY — PX: TEE WITHOUT CARDIOVERSION: SHX5443

## 2018-04-22 SURGERY — ECHOCARDIOGRAM, TRANSESOPHAGEAL
Anesthesia: General

## 2018-04-22 MED ORDER — PROPOFOL 500 MG/50ML IV EMUL
INTRAVENOUS | Status: DC | PRN
  Administered 2018-04-22: 100 ug/kg/min via INTRAVENOUS

## 2018-04-22 MED ORDER — SODIUM CHLORIDE 0.9 % IV SOLN
INTRAVENOUS | Status: DC
  Administered 2018-04-22: 08:00:00 via INTRAVENOUS

## 2018-04-22 MED ORDER — LIDOCAINE HCL (CARDIAC) PF 100 MG/5ML IV SOSY
PREFILLED_SYRINGE | INTRAVENOUS | Status: DC | PRN
  Administered 2018-04-22: 100 mg via INTRATRACHEAL

## 2018-04-22 MED ORDER — PROPOFOL 500 MG/50ML IV EMUL
INTRAVENOUS | Status: DC | PRN
  Administered 2018-04-22: 30 ug via INTRAVENOUS
  Administered 2018-04-22: 40 ug via INTRAVENOUS

## 2018-04-22 NOTE — Progress Notes (Addendum)
[address]       [employer] [address]             [phone]      7 Days Post-Op Procedure(s) (LRB): MINIMALLY INVASIVE EXCISION OF LEFT ATRIAL MYXOMA (N/A) TRANSESOPHAGEAL ECHOCARDIOGRAM (TEE) (N/A) Subjective: Remains in aflutter with RVR, EP to cardiovert today  Objective: Vital signs in last 24 hours: Temp:  [97.5 F (36.4 C)-98.5 F (36.9 C)] 98.5 F (36.9 C) (02/11 0400) Pulse Rate:  [129-145] 145 (02/11 0800) Cardiac Rhythm: Atrial flutter (02/11 0700) Resp:  [18-24] 18 (02/11 0800) BP: (111-131)/(81-109) 126/109 (02/11 0808) SpO2:  [93 %-97 %] 93 % (02/11 0800) Weight:  [83.1 kg] 83.1 kg (02/11 0613)  Hemodynamic parameters for last 24 hours:    Intake/Output from previous day: 02/10 0701 - 02/11 0700 In: 622 [P.O.:622] Out: 400 [Urine:400] Intake/Output this shift: No intake/output data recorded.  General appearance: alert, cooperative and no distress Heart: regular rate and rhythm and tachy Lungs: mild dim right base Abdomen: soft, nontender Extremities: no edema Wound: incis healing well  Lab Results: No results for input(s): WBC, HGB, HCT, PLT in the last 72 hours. BMET:  Recent Labs    04/20/18 0305 04/21/18 0325  NA 137 139  K 3.4* 4.4  CL 100 105  CO2 24 24  GLUCOSE 98 107*  BUN 11 13  CREATININE 0.95 1.20  CALCIUM 8.1* 8.2*    PT/INR:  Recent Labs    04/21/18 0325  LABPROT 14.1  INR 1.10   ABG    Component Value Date/Time   PHART 7.354 04/15/2018 1342   HCO3 21.8 04/15/2018 1342   TCO2 23 04/15/2018 1342   ACIDBASEDEF 4.0 (H) 04/15/2018 1342   O2SAT 97.0 04/15/2018 1342   CBG (last 3)  No results for input(s): GLUCAP in the last 72 hours.  Meds Scheduled Meds: . amiodarone  200 mg Oral BID  . amLODipine  5 mg Oral Daily  . apixaban  5 mg Oral BID  . atorvastatin  40 mg Oral q1800  . bisacodyl  10 mg Oral Daily   Or  . bisacodyl  10 mg Rectal Daily  . docusate sodium  200 mg Oral Daily  .  lactulose  20 g Oral Once  . mouth rinse  15 mL Mouth Rinse BID  . pantoprazole  40 mg Oral Daily  . sodium chloride flush  3 mL Intravenous Q12H   Continuous Infusions: . sodium chloride Stopped (04/16/18 1530)  . sodium chloride 20 mL/hr at 04/22/18 0807  . lactated ringers Stopped (04/16/18 1017)   PRN Meds:.morphine injection, ondansetron (ZOFRAN) IV, oxyCODONE, sodium chloride flush, traMADol  Xrays Dg Chest 2 View  Result Date: 04/21/2018 CLINICAL DATA:  Follow-up atelectasis. EXAM: CHEST - 2 VIEW COMPARISON:  February 2020 FINDINGS: Opacity in the right base, previously described as atelectasis, is stable. There is probably a small effusion on the right, a little larger in the interval based on the lateral view. No pneumothorax. The left lung is clear. The cardiomediastinal silhouette is unchanged. No other interval changes identified. IMPRESSION: 1. Stable opacity in the right base, previously described as atelectasis. 2. Probable small effusion, a little larger in the interval based on the lateral view. 3. No other change. Electronically Signed   By: Dorise Bullion III M.D   On: 04/21/2018 08:14    Assessment/Plan: S/P Procedure(s) (LRB): MINIMALLY INVASIVE EXCISION OF LEFT ATRIAL MYXOMA (N/A) TRANSESOPHAGEAL ECHOCARDIOGRAM (TEE) (N/A)  1  BP is stable with rapid Aflutter , for DCCV today 2 no new labs or xrays today 3 eliquis started yesterday by EP 4 conts therapies for CVA  LOS: 13 days    [name] [facility] 04/22/2018 Pager [phone]  I have seen and examined the patient and agree with the assessment and plan as outlined.  For DCCV later today.  Rexene Alberts, MD 04/22/2018 2:15 PM

## 2018-04-22 NOTE — Anesthesia Preprocedure Evaluation (Addendum)
Anesthesia Evaluation  Patient identified by MRN, date of birth, ID band Patient awake    Reviewed: Allergy & Precautions, H&P , NPO status , Patient's Chart, lab work & pertinent test results, reviewed documented beta blocker date and time   Airway Mallampati: I  TM Distance: >3 FB Neck ROM: Full    Dental no notable dental hx. (+) Teeth Intact, Dental Advisory Given, Missing,    Pulmonary neg pulmonary ROS,    Pulmonary exam normal breath sounds clear to auscultation       Cardiovascular Exercise Tolerance: Good hypertension, Pt. on medications and Pt. on home beta blockers  Rhythm:Regular Rate:Normal  Atrial Myxoma   Neuro/Psych CVA negative psych ROS   GI/Hepatic negative GI ROS, Neg liver ROS,   Endo/Other  negative endocrine ROS  Renal/GU negative Renal ROS  negative genitourinary   Musculoskeletal   Abdominal   Peds  Hematology negative hematology ROS (+)   Anesthesia Other Findings   Reproductive/Obstetrics negative OB ROS                            Anesthesia Physical  Anesthesia Plan  ASA: III  Anesthesia Plan: General   Post-op Pain Management:    Induction: Intravenous  PONV Risk Score and Plan: 2 and Treatment may vary due to age or medical condition  Airway Management Planned: Nasal Cannula, Simple Face Mask and Mask  Additional Equipment: None  Intra-op Plan:   Post-operative Plan:   Informed Consent: I have reviewed the patients History and Physical, chart, labs and discussed the procedure including the risks, benefits and alternatives for the proposed anesthesia with the patient or authorized representative who has indicated his/her understanding and acceptance.     Dental advisory given  Plan Discussed with: CRNA, Anesthesiologist and Surgeon  Anesthesia Plan Comments:         Anesthesia Quick Evaluation

## 2018-04-22 NOTE — Progress Notes (Signed)
CARDIAC REHAB PHASE I   PRE:  Rate/Rhythm: 90 SR  BP:  Supine:   Sitting: 132/84  Standing:    SaO2: 94 % RA  MODE:  Ambulation: 500 ft   POST:  Rate/Rhythm: 105 SR  BP:  Supine:   Sitting: 150/87  Standing:    SaO2: 96 % RA  1430-1450  Pt assisted with ambulation with stand by assist for 500 ft with walker. No oxygen needed. Pt reports no symptoms during walk. Returned to room, placed in recliner. Call bell within reach. No further needs stated. Will continue to follow.  Towanda Malkin RN, BSN 04/22/2018 2:48 PM

## 2018-04-22 NOTE — Progress Notes (Signed)
PT Cancellation Note  Patient Details Name: Cadin Luka MRN: [id number] DOB: May 14, 1955   Cancelled Treatment:    Reason Eval/Treat Not Completed: Patient at procedure or test/unavailable Per nursing, patient just went down to TEE. Will follow up as time allows.    Lanney Gins, PT, DPT Supplemental Physical Therapist 04/22/18 10:47 AM Pager: [phone] Office: [phone]

## 2018-04-22 NOTE — Transfer of Care (Signed)
Immediate Anesthesia Transfer of Care Note  Patient: Shawn Mccann  Procedure(s) Performed: TRANSESOPHAGEAL ECHOCARDIOGRAM (TEE) (N/A ) CARDIOVERSION (N/A )  Patient Location: Endoscopy Unit  Anesthesia Type:General  Level of Consciousness: awake, alert , oriented and patient cooperative  Airway & Oxygen Therapy: Patient Spontanous Breathing and Patient connected to nasal cannula oxygen  Post-op Assessment: Report given to RN and Post -op Vital signs reviewed and stable  Post vital signs: Reviewed and stable  Last Vitals:  Vitals Value Taken Time  BP 112/76 04/22/2018 12:32 PM  Temp    Pulse 85 04/22/2018 12:36 PM  Resp 21 04/22/2018 12:36 PM  SpO2 97 % 04/22/2018 12:36 PM  Vitals shown include unvalidated device data.  Last Pain:  Vitals:   04/22/18 1044  TempSrc: Oral  PainSc:       Patients Stated Pain Goal: 0 (78/29/56 2130)  Complications: No apparent anesthesia complications

## 2018-04-22 NOTE — Interval H&P Note (Signed)
History and Physical Interval Note:  04/22/2018 11:00 AM  Shawn Mccann  has presented today for surgery, with the diagnosis of a flutter  The various methods of treatment have been discussed with the patient and family. After consideration of risks, benefits and other options for treatment, the patient has consented to  Procedure(s): TRANSESOPHAGEAL ECHOCARDIOGRAM (TEE) (N/A) CARDIOVERSION (N/A) as a surgical intervention .  The patient's history has been reviewed, patient examined, no change in status, stable for surgery.  I have reviewed the patient's chart and labs.  Questions were answered to the patient's satisfaction.     Kirk Ruths

## 2018-04-22 NOTE — Progress Notes (Signed)
    Transesophageal Echocardiogram Note  Chick Cousins [id number] January 29, 1956  Procedure: Transesophageal Echocardiogram Indications: atrial flutter  Procedure Details Consent: Obtained Time Out: Verified patient identification, verified procedure, site/side was marked, verified correct patient position, special equipment/implants available, Radiology Safety Procedures followed,  medications/allergies/relevent history reviewed, required imaging and test results available.  Performed  Medications:  Pt sedated by anesthesia with lidocaine 100 mg and diprovan 200 mg IV.  Normal LV function; no LAA thrombus; thickened atrial septum following recent resection of atrial myxoma.  Pt subsequently underwent DCCV with 120 J to sinus rhythm.    Complications: No apparent complications; continue apixaban. Patient did tolerate procedure well.  Kirk Ruths, MD

## 2018-04-22 NOTE — Progress Notes (Signed)
Progress Note  Patient Name: Shawn Mccann Date of Encounter: 04/22/2018  Primary Cardiologist: Fransico Him, MD   Subjective   He denies chest pain or sob. No palpitations.  Inpatient Medications    Scheduled Meds: . amiodarone  200 mg Oral BID  . amLODipine  5 mg Oral Daily  . apixaban  5 mg Oral BID  . atorvastatin  40 mg Oral q1800  . bisacodyl  10 mg Oral Daily   Or  . bisacodyl  10 mg Rectal Daily  . docusate sodium  200 mg Oral Daily  . lactulose  20 g Oral Once  . mouth rinse  15 mL Mouth Rinse BID  . pantoprazole  40 mg Oral Daily  . sodium chloride flush  3 mL Intravenous Q12H   Continuous Infusions: . sodium chloride Stopped (04/16/18 1530)  . lactated ringers Stopped (04/22/18 1235)   PRN Meds: morphine injection, ondansetron (ZOFRAN) IV, oxyCODONE, sodium chloride flush, traMADol   Vital Signs    Vitals:   04/22/18 1232 04/22/18 1242 04/22/18 1252 04/22/18 1310  BP: 112/76 124/74 121/80 (!) 134/92  Pulse: 85 85 84 91  Resp: 20 20 18 19   Temp:      TempSrc:      SpO2: 94% 97% 97% 99%  Weight:      Height:        Intake/Output Summary (Last 24 hours) at 04/22/2018 1736 Last data filed at 04/22/2018 1414 Gross per 24 hour  Intake 547.49 ml  Output 625 ml  Net -77.51 ml   Filed Weights   04/21/18 0547 04/22/18 0613 04/22/18 1044  Weight: 82.7 kg 83.1 kg 83.1 kg    Telemetry    Atrial flutter with a RVR - Personally Reviewed  ECG    none - Personally Reviewed  Physical Exam   GEN: No acute distress.   Neck: 7 cm JVD Cardiac: Reg tachy, no murmurs, rubs, or gallops.  Respiratory: Clear to auscultation bilaterally. GI: Soft, nontender, non-distended  MS: No edema; No deformity. Neuro:  Nonfocal  Psych: Normal affect   Labs    Chemistry Recent Labs  Lab 04/18/18 0324 04/20/18 0305 04/21/18 0325  NA 139 137 139  K 3.5 3.4* 4.4  CL 101 100 105  CO2 25 24 24   GLUCOSE 105* 98 107*  BUN 9 11 13   CREATININE 0.87 0.95 1.20    CALCIUM 7.7* 8.1* 8.2*  GFRNONAA >60 >60 >60  GFRAA >60 >60 >60  ANIONGAP 13 13 10      Hematology Recent Labs  Lab 04/16/18 2350 04/17/18 0608 04/18/18 0324  WBC 14.8* 14.8* 12.8*  RBC 3.51* 3.74* 3.80*  HGB 10.4* 10.7* 10.9*  HCT 30.6* 33.1* 33.4*  MCV 87.2 88.5 87.9  MCH 29.6 28.6 28.7  MCHC 34.0 32.3 32.6  RDW 12.6 12.7 12.8  PLT 226 248 238    Cardiac EnzymesNo results for input(s): TROPONINI in the last 168 hours. No results for input(s): TROPIPOC in the last 168 hours.   BNPNo results for input(s): BNP, PROBNP in the last 168 hours.   DDimer No results for input(s): DDIMER in the last 168 hours.   Radiology    Dg Chest 2 View  Result Date: 04/21/2018 CLINICAL DATA:  Follow-up atelectasis. EXAM: CHEST - 2 VIEW COMPARISON:  February 2020 FINDINGS: Opacity in the right base, previously described as atelectasis, is stable. There is probably a small effusion on the right, a little larger in the interval based on the lateral view. No  pneumothorax. The left lung is clear. The cardiomediastinal silhouette is unchanged. No other interval changes identified. IMPRESSION: 1. Stable opacity in the right base, previously described as atelectasis. 2. Probable small effusion, a little larger in the interval based on the lateral view. 3. No other change. Electronically Signed   By: Dorise Bullion III M.D   On: 04/21/2018 08:14    Cardiac Studies   TEE pending  Patient Profile     63 y.o. male admitted with a stroke and found to have a LA myxoma, s/p resection with post op atrial flutter.   Assessment & Plan    1. Atrial flutter - he will undergo TEE guided DCCV today. He is not well controlled in atrial flutter despite medical therapy.  2. Stroke - he will continue systemic anti-coagulation.      For questions or updates, please contact [place] Please consult [email or web address] for contact info under Cardiology/STEMI.      Signed, [name], MD  04/22/2018, 5:36 PM   Patient ID: Shawn Mccann, male   DOB: [birth year], 63 y.o.   MRN: [id number]

## 2018-04-22 NOTE — Anesthesia Postprocedure Evaluation (Signed)
Anesthesia Post Note  Patient: Shawn Mccann  Procedure(s) Performed: TRANSESOPHAGEAL ECHOCARDIOGRAM (TEE) (N/A ) CARDIOVERSION (N/A )     Patient location during evaluation: PACU Anesthesia Type: General Level of consciousness: awake and alert Pain management: pain level controlled Vital Signs Assessment: post-procedure vital signs reviewed and stable Respiratory status: spontaneous breathing, nonlabored ventilation, respiratory function stable and patient connected to nasal cannula oxygen Cardiovascular status: blood pressure returned to baseline and stable Postop Assessment: no apparent nausea or vomiting Anesthetic complications: no    Last Vitals:  Vitals:   04/22/18 1252 04/22/18 1310  BP: 121/80 (!) 134/92  Pulse: 84 91  Resp: 18 19  Temp:    SpO2: 97% 99%    Last Pain:  Vitals:   04/22/18 1044  TempSrc: Oral  PainSc:                  Shawn Mccann

## 2018-04-23 ENCOUNTER — Encounter (HOSPITAL_COMMUNITY): Payer: Self-pay | Admitting: Cardiology

## 2018-04-23 LAB — BASIC METABOLIC PANEL
Anion gap: 10 (ref 5–15)
BUN: 15 mg/dL (ref 8–23)
CO2: 25 mmol/L (ref 22–32)
CREATININE: 1.33 mg/dL — AB (ref 0.61–1.24)
Calcium: 8.5 mg/dL — ABNORMAL LOW (ref 8.9–10.3)
Chloride: 102 mmol/L (ref 98–111)
GFR calc Af Amer: >60 mL/min (ref >60)
GFR calc non Af Amer: 56 mL/min — ABNORMAL LOW (ref >60)
Glucose, Bld: 108 mg/dL — ABNORMAL HIGH (ref 70–99)
Potassium: 4.5 mmol/L (ref 3.5–5.1)
Sodium: 137 mmol/L (ref 135–145)

## 2018-04-23 MED ORDER — APIXABAN 5 MG PO TABS: 5.0000 mg | ORAL_TABLET | Freq: Two times a day (BID) | ORAL | 0 refills | Status: DC

## 2018-04-23 MED ORDER — AMLODIPINE BESYLATE 5 MG PO TABS: 5.0000 mg | ORAL_TABLET | Freq: Every day | ORAL | 3 refills | Status: DC

## 2018-04-23 MED ORDER — TRAMADOL HCL 50 MG PO TABS: 50.0000 mg | ORAL_TABLET | ORAL | 0 refills | Status: DC | PRN

## 2018-04-23 MED ORDER — METOPROLOL TARTRATE 50 MG PO TABS: 50.0000 mg | ORAL_TABLET | Freq: Two times a day (BID) | ORAL | 3 refills | Status: DC

## 2018-04-23 MED ORDER — AMIODARONE HCL 200 MG PO TABS: 200.0000 mg | ORAL_TABLET | Freq: Two times a day (BID) | ORAL | 1 refills | Status: DC

## 2018-04-23 MED FILL — ELIQUIS 5 MG TABLET: 5 | 30 days supply | Qty: 60 | Fill #0

## 2018-04-23 NOTE — Progress Notes (Signed)
Pharmacy - Eliquis  Sent prescription for 30 day free supply of Eliquis to [facility]. Education complete  Thank you Anette Guarneri, PharmD [phone]

## 2018-04-23 NOTE — Care Management Note (Addendum)
Case Management Note Original Note Created Midge Minium RN, BSN, NCM-BC, ACM-RN [phone] 04/16/2018, 3:24 PM    Patient Details  Name: Shawn Mccann MRN: [id number] Date of Birth: November 13, 1955  Subjective/Objective:  63 yo male presented with dysarthria and aphasia, found to have multiple infracts; s/p resection of myxoma.              Action/Plan: CM following for dispositional needs. Patient lived at home alone, employed and independent PTA. PCP: Dr. Wende Neighbors; Pharmacy[facility]. PT/OT eval complete with CIR recommended with Dr. Letta Pate (IP Rehab MD) and the Rehab [facility] following; patient will require insurance authorization and lines removed prior to transitioning to CIR. CM team will continue to follow.   Expected Discharge Date:                  Expected Discharge Plan:  [place]  In-House Referral:  NA  Discharge planning Services  CM Consult  Post Acute Care Choice:  Durable Medical Equipment, Home Health Choice offered to:  Patient, Sibling  DME Arranged:  [place] rolling DME Agency:  [place]:    [place]:     Status of Service:  Completed, signed off  If discussed at [place] of Stay Meetings, dates discussed:    Discharge Disposition: home/home health   Additional Comments:  04/23/18- 1130- Marvetta Gibbons RN, CM- have been notified by Genie L. With [facility] IP rehab that pt has progressed past needing intensive IP rehab and could safely return home with [facility] or outpt services. CM spoke with pt at bedside who states that he and his sisters have spoken and plan is for him to go to his sister Ellen's home to stay. He is open to [facility] services for continued therapy and needs RW for home. List provided to pt Per CMS guidelines from medicare.gov website with star ratings (copy placed in shadow chart)- pt request that CM go over [facility] choices with his sisters. Call made to Siri Cole per pt request to discuss transition of care plan - msg left- and  awaiting return call.  Update 1230- return call received from Mr. Dell Ponto- [facility] husband returning call)- confirmed family's plan for pt to return home with sister Dorian Pod. They are agreeable to [facility]. - state they are ready to transport today if needed or tomorrow whenever MD releases pt. Questions answered per Mr. Dell Ponto pt has been approved for disability with his job- and they will f/u with PCP for paperwork. Call then made to sister Pascal Lux to confirm address- her address is [address] [place] [address]- phone # [phone], choice offered to Dorian Pod for [facility] services, list provided per Per CMS guidelines from medicare.gov website with star ratings (copy placed in shadow chart) for [facility]. Per Dorian Pod they do not have a preference and are fine using whichever agency can take referral and work with pt's insurance. Bedside RN to call rounding team to update on transition plan and ask for [facility] and DME orders. [facility] pharmacy has delivered 30 day free supply of Eliquis to bedside for transition home. Update 1520- CM made calls to multiple [facility] agencies on CMS list- with multiple agencies stating they were unable to accept referral- call made to [facility]- spoke with Kennyth Lose- who was able to accept referral- states that due to [facility] insurance need HH orders to be signed by MD- will see if Dr Roxy Manns can cosign order. Per Kennyth Lose once they receive  auth from insurance can set up start of care for 2/14- faxed current orders, facesheet, and d/c summary to [phone] (direct contact # for Kennyth Lose is [phone])- once MD has cosigned orders will refax [place] orders.   Marvetta Gibbons RN, BSN Transitions of Care Unit 4E- RN Case Manager [phone] 04/23/2018, 1:24 PM

## 2018-04-23 NOTE — Progress Notes (Signed)
Occupational Therapy Treatment Patient Details Name: Shawn Mccann MRN: [id number] DOB: 1955-09-30 Today's Date: 04/23/2018    History of present illness Pt is a 63 y/o male admitted secondary to expressive aphasia. 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 Pt is undergoing pre-op work up for CABG for resection of myxoma, s/p resection of myoxoma.  PMH including but not limited to hypertension, hyperlipidemia    OT comments  Pt progressing well towards established OT goals. Pt performing grooming at sink at supervision level. Pt demonstrating increased activity tolerance and cognition to perform simple meal preparation task with Min verbal cues and supervision. Educating pt on safe tub transfer and use of 3N1 as shower chair; pt demonstrating understanding and performing with supervision. Update dc recommendation to home with HHOT to increase safety and independence with ADLs and IADLs. Will continue to follow acutely as admitted.    Follow Up Recommendations  Home health OT;Supervision/Assistance - 24 hour    Equipment Recommendations  3 in 1 bedside commode    Recommendations for Other Services PT consult    Precautions / Restrictions Precautions Precautions: Fall Restrictions Weight Bearing Restrictions: No       Mobility Bed Mobility               General bed mobility comments: Patient in chair upon arrival  Transfers Overall transfer level: Needs assistance Equipment used: None;Rolling walker (2 wheeled) Transfers: Sit to/from Stand Sit to Stand: Supervision         General transfer comment: supervision for safety    Balance Overall balance assessment: Needs assistance Sitting-balance support: Feet supported Sitting balance-Leahy Scale: Good     Standing balance support: No upper extremity supported;During functional activity Standing balance-Leahy Scale: Good                              ADL either performed or assessed with clinical judgement   ADL Overall ADL's : Needs assistance/impaired     Grooming: Brushing hair;Standing;Wash/dry hands;Supervision/safety(Shaving) Grooming Details (indicate cue type and reason): Pt performing hand hygiene and brushing his hair before taking walk in hallway. Supervision for safety                         Tub/ Shower Transfer: Tub transfer;Supervision/safety;Ambulation;3 in 1 Tub/Shower Transfer Details (indicate cue type and reason): Providing education for safe tub transfer and use of 3N1 as shower seat. Supervision for safety.  Functional mobility during ADLs: Supervision/safety General ADL Comments: Pt performing grooming at sink with supervision demonstrating increased activity tolerance and balance. Pt performing simple meal preparation task to make coffee with 3 verbal cues and supervision. Demonstrating increased cognition compared to prior session. Educating pt on use of 3N1 as shower chair for tub. Pt demonstrating understanding with supervision for safety.     Vision       Perception     Praxis      Cognition Arousal/Alertness: Awake/alert Behavior During Therapy: WFL for tasks assessed/performed Overall Cognitive Status: Impaired/Different from baseline Area of Impairment: Problem solving                             Problem Solving: Slow processing;Requires verbal cues General Comments: Pt performing simple meal prepartion task and requiring 3 verbal cues for problem solving such as needing a lid to prevent spilling of  coffee or to locate coffee button on mechine. demonstrating increased awareness, memory, and sequencing compared to prior session        Exercises     Shoulder Instructions       General Comments HR 90s     Pertinent Vitals/ Pain       Pain Assessment: Faces Faces Pain Scale: No hurt Pain Intervention(s): Monitored during session  Home Living                                           Prior Functioning/Environment              Frequency  Min 3X/week        Progress Toward Goals  OT Goals(current goals can now be found in the care plan section)  Progress towards OT goals: Progressing toward goals  Acute Rehab OT Goals Patient Stated Goal: get stronger OT Goal Formulation: With patient Time For Goal Achievement: 04/30/18 Potential to Achieve Goals: Good ADL Goals Pt Will Perform Grooming: with min guard assist;standing Pt Will Perform Upper Body Bathing: with supervision;sitting Pt Will Perform Lower Body Bathing: with min guard assist;sit to/from stand Pt Will Perform Upper Body Dressing: with supervision;sitting Pt Will Perform Lower Body Dressing: with min guard assist;with adaptive equipment;sit to/from stand Pt Will Transfer to Toilet: with min guard assist;ambulating;regular height toilet;bedside commode;grab bars Pt Will Perform Toileting - Clothing Manipulation and hygiene: with min guard assist;sit to/from stand  Plan Discharge plan needs to be updated    Co-evaluation                 AM-PAC OT "6 Clicks" Daily Activity     Outcome Measure   Help from another person eating meals?: None Help from another person taking care of personal grooming?: A Little Help from another person toileting, which includes using toliet, bedpan, or urinal?: A Little Help from another person bathing (including washing, rinsing, drying)?: A Lot Help from another person to put on and taking off regular upper body clothing?: A Little Help from another person to put on and taking off regular lower body clothing?: A Lot 6 Click Score: 17    End of Session Equipment Utilized During Treatment: Rolling walker;Gait belt  OT Visit Diagnosis: Unsteadiness on feet (R26.81);Pain;Cognitive communication deficit (R41.841) Symptoms and signs involving cognitive functions: Cerebral infarction Pain - Right/Left: Right Pain - part  of body: Ankle and joints of foot   Activity Tolerance Patient tolerated treatment well   Patient Left in chair;with call bell/phone within reach   Nurse Communication Mobility status        Time: 8119-1478 OT Time Calculation (min): 17 min  Charges: OT General Charges $OT Visit: 1 Visit OT Treatments $Self Care/Home Management : 8-22 mins  [place], OTR/L Acute Rehab Pager: [phone] Office: [place] 04/23/2018, 2:35 PM

## 2018-04-23 NOTE — Progress Notes (Signed)
Physical Therapy Treatment Patient Details Name: Shawn Mccann MRN: [id number] DOB: 08-Mar-1956 Today's Date: 04/23/2018    History of Present Illness Pt is a 63 y/o male admitted secondary to expressive aphasia. 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 Pt is undergoing pre-op work up for CABG for resection of myxoma, s/p resection of myoxoma.  PMH including but not limited to hypertension, hyperlipidemia     PT Comments    Pt progressing very well towards his physical therapy goals. Has improved to supervision level with ambulating hallway distances with no assistive device; gait speed indicative of limited community ambulatory. Scoring 19/24 on Dynamic Gait Index, indicating pt does have high level balance deficits and may be at risk for falls. Requiring min assist for stair negotiation to prepare for discharge home. Recommended pt sister guard pt on stair management to enter/exit home. HR peak at 124 bpm. Updated d/c recommendation to OPPT.     Follow Up Recommendations  Outpatient PT (preferred neuro)     Equipment Recommendations  None recommended by PT    Recommendations for Other Services       Precautions / Restrictions Precautions Precautions: Fall Restrictions Weight Bearing Restrictions: No    Mobility  Bed Mobility               General bed mobility comments: Patient in chair upon arrival  Transfers Overall transfer level: Modified independent Equipment used: None Transfers: Sit to/from Stand Sit to Stand: Modified independent (Device/Increase time)         General transfer comment: supervision for safety  Ambulation/Gait Ambulation/Gait assistance: Supervision Gait Distance (Feet): 350 Feet Assistive device: None Gait Pattern/deviations: Step-through pattern Gait velocity: 2.39 ft/s Gait velocity interpretation: 1.31 - 2.62 ft/sec, indicative of limited community ambulator General Gait  Details: Decreased push off noted bilaterally during terminal stance. Able to perform high level balance activities without overt LOB   Stairs Stairs: Yes Stairs assistance: Min assist Stair Management: One rail Right Number of Stairs: 7 General stair comments: MinA to balance for descending steps. cues for step by step pattern   Wheelchair Mobility    Modified Rankin (Stroke Patients Only)       Balance Overall balance assessment: Needs assistance Sitting-balance support: Feet supported Sitting balance-Leahy Scale: Good     Standing balance support: No upper extremity supported;During functional activity Standing balance-Leahy Scale: Good                   Standardized Balance Assessment Standardized Balance Assessment : Dynamic Gait Index   Dynamic Gait Index Level Surface: Mild Impairment Change in Gait Speed: Normal Gait with Horizontal Head Turns: Normal Gait with Vertical Head Turns: Normal Gait and Pivot Turn: Mild Impairment Step Over Obstacle: Mild Impairment Step Around Obstacles: Normal Steps: Moderate Impairment Total Score: 19      Cognition Arousal/Alertness: Awake/alert Behavior During Therapy: WFL for tasks assessed/performed Overall Cognitive Status: Impaired/Different from baseline Area of Impairment: Problem solving                             Problem Solving: Slow processing;Requires verbal cues General Comments: following multi step commands with increased time.       Exercises      General Comments General comments (skin integrity, edema, etc.): HR 90s       Pertinent Vitals/Pain Pain Assessment: Faces Faces Pain Scale: No hurt Pain Intervention(s): Monitored during  session    Home Living                      Prior Function            PT Goals (current goals can now be found in the care plan section) Acute Rehab PT Goals Patient Stated Goal: get stronger PT Goal Formulation: With patient Time  For Goal Achievement: 04/24/18 Potential to Achieve Goals: Good Progress towards PT goals: Progressing toward goals    Frequency    Min 4X/week      PT Plan Discharge plan needs to be updated    Co-evaluation              AM-PAC PT "6 Clicks" Mobility   Outcome Measure  Help needed turning from your back to your side while in a flat bed without using bedrails?: None Help needed moving from lying on your back to sitting on the side of a flat bed without using bedrails?: None Help needed moving to and from a bed to a chair (including a wheelchair)?: None Help needed standing up from a chair using your arms (e.g., wheelchair or bedside chair)?: None Help needed to walk in hospital room?: None Help needed climbing 3-5 steps with a railing? : A Little 6 Click Score: 23    End of Session Equipment Utilized During Treatment: Gait belt Activity Tolerance: Patient tolerated treatment well Patient left: in chair;with call bell/phone within reach Nurse Communication: Mobility status PT Visit Diagnosis: Other abnormalities of gait and mobility (R26.89);Other symptoms and signs involving the nervous system (R29.898)     Time: 0539-7673 PT Time Calculation (min) (ACUTE ONLY): 8 min  Charges:  $Gait Training: 8-22 mins                    Shawn Mccann, [place], DPT Acute Rehabilitation Services Pager [phone] Office [phone]    Shawn Mccann 04/23/2018, 4:04 PM

## 2018-04-23 NOTE — Progress Notes (Signed)
D/C instructions given to pt including wound care and signs/symptoms of stroke. IV removed, clean and intact. Prescription for eliquis for pt to take home. All questions answered. Sister to escort pt home.  Clyde Canterbury, RN

## 2018-04-23 NOTE — Progress Notes (Addendum)
      [address]       [place] [address]             [phone]      1 Day Post-Op Procedure(s) (LRB): TRANSESOPHAGEAL ECHOCARDIOGRAM (TEE) (N/A) CARDIOVERSION (N/A)   Subjective:  No new complaints.  He states he is doing better.  His aphasia continues to improve.  Objective: Vital signs in last 24 hours: Temp:  [97.6 F (36.4 C)-99.6 F (37.6 C)] 97.6 F (36.4 C) (02/12 0457) Pulse Rate:  [83-92] 83 (02/12 0457) Cardiac Rhythm: Normal sinus rhythm (02/12 0700) Resp:  [11-24] 11 (02/12 0700) BP: (112-156)/(74-104) 156/97 (02/12 0813) SpO2:  [94 %-99 %] 96 % (02/12 0457) Weight:  [82.2 kg-83.1 kg] 82.2 kg (02/12 0700)  Intake/Output from previous day: 02/11 0701 - 02/12 0700 In: 737.5 [P.O.:730; I.V.:7.5] Out: 1025 [Urine:1025]  General appearance: alert, cooperative and no distress Heart: regular rate and rhythm Lungs: clear to auscultation bilaterally Abdomen: soft, non-tender; bowel sounds normal; no masses,  no organomegaly Extremities: edema trace Wound: clean and dry  Lab Results: No results for input(s): WBC, HGB, HCT, PLT in the last 72 hours. BMET:  Recent Labs    04/21/18 0325 04/23/18 0323  NA 139 137  K 4.4 4.5  CL 105 102  CO2 24 25  GLUCOSE 107* 108*  BUN 13 15  CREATININE 1.20 1.33*  CALCIUM 8.2* 8.5*    PT/INR:  Recent Labs    04/21/18 0325  LABPROT 14.1  INR 1.10   ABG    Component Value Date/Time   PHART 7.354 04/15/2018 1342   HCO3 21.8 04/15/2018 1342   TCO2 23 04/15/2018 1342   ACIDBASEDEF 4.0 (H) 04/15/2018 1342   O2SAT 97.0 04/15/2018 1342   CBG (last 3)  No results for input(s): GLUCAP in the last 72 hours.  Assessment/Plan: S/P Procedure(s) (LRB): TRANSESOPHAGEAL ECHOCARDIOGRAM (TEE) (N/A) CARDIOVERSION (N/A)  1. CV- S/p Cardioversion- in NSR this morning, continue Amiodarone, Eliquis 2. Pulm- no acute issues, continue IS 3. Renal- creatinine ok, not currently on Lasix or nephrotoxic  agents 4. S/P Stroke, continue PT/OT 5. Dispo- patient stable, in NSR s/P Cardioversion yesterday, continue Eliquis, likely for d/c to CIR today if bed remains available and okay with Dr. Roxy Manns    LOS: 14 days    Ellwood Handler 04/23/2018  I have seen and examined the patient and agree with the assessment and plan as outlined.  Okay for d/c to CIR service for rehab from his recent stroke  Rexene Alberts, MD 04/23/2018 8:56 AM

## 2018-04-23 NOTE — Progress Notes (Addendum)
Progress Note  Patient Name: Shawn Mccann Date of Encounter: 04/23/2018  Primary Cardiologist: Fransico Him, MD   Subjective   Denies CP or palpitations  Inpatient Medications    Scheduled Meds: . amiodarone  200 mg Oral BID  . amLODipine  5 mg Oral Daily  . apixaban  5 mg Oral BID  . atorvastatin  40 mg Oral q1800  . bisacodyl  10 mg Oral Daily   Or  . bisacodyl  10 mg Rectal Daily  . docusate sodium  200 mg Oral Daily  . lactulose  20 g Oral Once  . mouth rinse  15 mL Mouth Rinse BID  . pantoprazole  40 mg Oral Daily  . sodium chloride flush  3 mL Intravenous Q12H   Continuous Infusions: . sodium chloride Stopped (04/16/18 1530)  . lactated ringers Stopped (04/22/18 1235)   PRN Meds: morphine injection, ondansetron (ZOFRAN) IV, oxyCODONE, sodium chloride flush, traMADol   Vital Signs    Vitals:   04/22/18 2337 04/23/18 0457 04/23/18 0700 04/23/18 0813  BP: 115/83 (!) 142/88  (!) 148/85  Pulse: 87 83  76  Resp: 19 19 11  (!) 24  Temp: 99.6 F (37.6 C) 97.6 F (36.4 C)  97.6 F (36.4 C)  TempSrc: Oral Oral  Oral  SpO2: 97% 96%  98%  Weight:   82.2 kg   Height:        Intake/Output Summary (Last 24 hours) at 04/23/2018 0909 Last data filed at 04/23/2018 3614 Gross per 24 hour  Intake 977.49 ml  Output 1025 ml  Net -47.51 ml   Last 3 Weights 04/23/2018 04/22/2018 04/22/2018  Weight (lbs) 181 lb 3.5 oz 183 lb 3.2 oz 183 lb 3.2 oz  Weight (kg) 82.2 kg 83.1 kg 83.1 kg      Telemetry    SR, 80's - Personally Reviewed  ECG    04/22/2018 is SR - Personally Reviewed  Physical Exam   GEN: No acute distress.   Neck: No JVD Cardiac: RRR, no murmurs, rubs, or gallops.  Respiratory: CTA b/l GI: Soft, nontender, non-distended  MS: No edema; No deformity. Neuro:  speech seems to be improving Psych: Normal affect   Labs    Chemistry Recent Labs  Lab 04/20/18 0305 04/21/18 0325 04/23/18 0323  NA 137 139 137  K 3.4* 4.4 4.5  CL 100 105 102  CO2 24  24 25   GLUCOSE 98 107* 108*  BUN 11 13 15   CREATININE 0.95 1.20 1.33*  CALCIUM 8.1* 8.2* 8.5*  GFRNONAA >60 >60 56*  GFRAA >60 >60 >60  ANIONGAP 13 10 10      Hematology Recent Labs  Lab 04/16/18 2350 04/17/18 0608 04/18/18 0324  WBC 14.8* 14.8* 12.8*  RBC 3.51* 3.74* 3.80*  HGB 10.4* 10.7* 10.9*  HCT 30.6* 33.1* 33.4*  MCV 87.2 88.5 87.9  MCH 29.6 28.6 28.7  MCHC 34.0 32.3 32.6  RDW 12.6 12.7 12.8  PLT 226 248 238    Cardiac EnzymesNo results for input(s): TROPONINI in the last 168 hours. No results for input(s): TROPIPOC in the last 168 hours.   BNPNo results for input(s): BNP, PROBNP in the last 168 hours.   DDimer No results for input(s): DDIMER in the last 168 hours.   Radiology    No results found.  Cardiac Studies   04/15/2018: TEE Result status: Final result   Septum: No Patent Foramen Ovale present.  Left atrium: Patent foramen ovale not present.  Left atrium: Small pedunculated mass  present consistent with myxoma. The mass is mobile and located on the interatrial septum.  Aortic valve: No AV vegetation. No evidence of papillary fibroelastoma.  Mitral valve: No leaflet thickening and calcification present. Trace regurgitation.  Right ventricle: Normal cavity size, wall thickness and ejection fraction. No thrombus present. No mass present.   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 These usually run posterior to the aorta and not intra-arterial. We were unable to confirm the course.  LAD contains mid 40% eccentric narrowing.  Right coronary is dominant and without obstruction.  Normal LVEDP.  No tumor blush is noted, hence possibly explaining fragmentation of the myxoma leading to multiple emboli.  Tachycardia and hypotension secondary to beta-blocker withdrawal  RECOMMENDATIONS:   Resume IV heparin in 4 hours without bolus.  Consider adding beta-blocker therapy to solve  beta-blocker withdrawal.1   Patient Profile     63 y.o. male HTN only until this hospital stay, admitted with stroke, found with L atrial myxoma, s/p ressection developed post-op AFlutter w/RVR  Assessment & Plan    1. Post-op AFlutter     EKG today to see better, not likely typical     CHA2DS2Vasc is one, started on Eliquis here     Now s/p TEE/DCCV     Remains in SR  Dr. Lovena Le has seen and examined the patient OK to discharge from an EP perspective Recommend continuing amiodarone for 6 weeks with general cardiology follow up out patient (in place)  EP will sign off though remain available, please recall if needed   For questions or updates, please contact [place] Please consult [email or web address] for contact info under        Signed, Baldwin Jamaica, PA-C  04/23/2018, 9:09 AM    EP Attending  Patient seen and examined. Agree with above. The patient is maintaining NSR. He will be discharged home on amiodarone for 4-6 weeks.   Mikle Bosworth.D.

## 2018-04-23 NOTE — Progress Notes (Signed)
  Speech Language Pathology Treatment: Cognitive-Linquistic  Patient Details Name: Shawn Mccann MRN: [id number] DOB: [birth year] Today's Date: 04/23/2018 Time: 1829-9371 SLP Time Calculation (min) (ACUTE ONLY): 12 min  Assessment / Plan / Recommendation Clinical Impression  Per chart review, note that plan is now for pt to d/c home. Tx focused primarily on education and recommendation for 24/7 supervision upon return home due to communication impairments. Pt believes that he has this from family support. He is still communicating at the word to short-phrase level, needing Mod cues to increase his length of utterance. He completed confrontational naming task with 100% accuracy with self-corrections. Pt will need SLP f/u after discharge.   HPI HPI: Pt is a 63 y.o. male with medical history significant of HTN who presented with dysarthria and aphasia; he was found to have multiple infracts on evaluation at [facility] and was transferred to [facility]. 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       SLP Plan  Continue with current plan of care       Recommendations                   Follow up Recommendations: Home health SLP;24 hour supervision/assistance SLP Visit Diagnosis: Aphasia (R47.01) Plan: Continue with current plan of care       GO                [name] Fritz Cauthon 04/23/2018, 4:49 PM  Nuala Alpha, M.A. [place] Acute Environmental education officer [phone] Office [phone]

## 2018-04-23 NOTE — Progress Notes (Signed)
IP rehab team - I spoke with rehab team and Dr. Naaman Plummer with rehab.  Patient is now doing very well with therapies and does not need an acute inpatient rehab admission.  I spoke with patient and I called his sister, Shawn Mccann.  I have also let the case manager know of the above.  Recommendations now are for outpatient therapy for continued speech therapy or HH therapies.  Call me for questions.  [phone]

## 2018-04-23 NOTE — Progress Notes (Addendum)
CARDIAC REHAB PHASE I   PRE:  Rate/Rhythm: 93 SR  BP:  Sitting: 148/85      SaO2: 97 RA  MODE:  Ambulation: 470 ft   POST:  Rate/Rhythm: 103 ST  BP:  Sitting: 151/86    SaO2: 98 RA   Pt ambulated 459ft in hallway standby assist with front wheel walker. Pt denies any complaints. Reviewed restrictions and exercise guidelines. Requested walker from CM. Will refer to CRP II . Poss d/c today.  [id number] Rufina Falco, RN BSN 04/23/2018 11:31 AM

## 2018-04-25 NOTE — Progress Notes (Signed)
Received call from [facility] [place]; Patient informed her that he does not want to use their [place] services and is [place]; CM called  [facility] and was informed that they cannot accept the patient due to no availability; CM attempted to contact the patient at home - voice mail left- encouraged him to stay with the original [employer] agency or to contact his PCP. Mindi Slicker [facility] [phone]

## 2018-05-01 ENCOUNTER — Other Ambulatory Visit: Payer: Self-pay | Admitting: *Deleted

## 2018-05-01 DIAGNOSIS — Z86018 Personal history of other benign neoplasm: Secondary | ICD-10-CM

## 2018-05-05 ENCOUNTER — Ambulatory Visit (INDEPENDENT_AMBULATORY_CARE_PROVIDER_SITE_OTHER): Payer: Self-pay | Admitting: Surgical

## 2018-05-05 ENCOUNTER — Ambulatory Visit
Admission: RE | Admit: 2018-05-05 | Discharge: 2018-05-05 | Disposition: A | Discharge status: Home / Self Care | Payer: BLUE CROSS/BLUE SHIELD | Source: Ambulatory Visit | Attending: Thoracic Surgery (Cardiothoracic Vascular Surgery) | Admitting: Thoracic Surgery (Cardiothoracic Vascular Surgery)

## 2018-05-05 ENCOUNTER — Other Ambulatory Visit: Payer: Self-pay

## 2018-05-05 ENCOUNTER — Other Ambulatory Visit: Payer: Self-pay | Admitting: Thoracic Surgery (Cardiothoracic Vascular Surgery)

## 2018-05-05 ENCOUNTER — Ambulatory Visit: Admission: RE | Admit: 2018-05-05 | Payer: BLUE CROSS/BLUE SHIELD | Source: Ambulatory Visit

## 2018-05-05 VITALS — BP 142/89 | HR 75 | Resp 18 | Ht 63.0 in | Wt 177.0 lb

## 2018-05-05 DIAGNOSIS — Z86018 Personal history of other benign neoplasm: Secondary | ICD-10-CM

## 2018-05-05 DIAGNOSIS — J9811 Atelectasis: Secondary | ICD-10-CM | POA: Diagnosis not present

## 2018-05-05 DIAGNOSIS — Z736 Limitation of activities due to disability: Secondary | ICD-10-CM

## 2018-05-05 DIAGNOSIS — J90 Pleural effusion, not elsewhere classified: Secondary | ICD-10-CM | POA: Diagnosis not present

## 2018-05-05 DIAGNOSIS — Z87898 Personal history of other specified conditions: Secondary | ICD-10-CM

## 2018-05-05 NOTE — Patient Instructions (Signed)
Discussed activity progression and he is to continue with his therapies.  He has not been given approval for driving until follow-up with neurology.

## 2018-05-05 NOTE — Progress Notes (Signed)
[address]       [place] [address]             [phone]      Shawn Mccann [place] Medical Record #[id number] Date of Birth: 07/29/1955  Referring: Sueanne Margarita, MD Primary Care: Ocie Doyne., MD Primary Cardiologist: Fransico Him, MD   Chief Complaint:   POST OP FOLLOW UP CARDIOTHORACIC SURGERY OPERATIVE NOTE  Date of Procedure:                04/15/2018  Preoperative Diagnosis:      Left Atrial Myxoma  Postoperative Diagnosis:    Same  Procedure:        Minimally-Invasive Resection Left Atrial Myxoma             Ultrasound-guided percutaneous arterial and venous access for cardiopulmonary bypass             Right miniature anterior thoracotomy             Autologous pericardial patch closure of interatrial septum               Surgeon:        Valentina Gu. Roxy Manns, MD  Assistant:       Nani Skillern, PA-C  Anesthesia:    Arabella Merles, MD  Operative Findings: ? Benign-appearing left atrial mass c/w atrial myxoma ? Normal left ventricular systolic function   History of Present Illness:    The patient is a 63 year old male status post the above described procedure seen in the office on today's date and routine postsurgical follow-up.  The patient initially presented with a CVA and this led ultimately to the diagnosis of the left atrial myxoma.  He did well postoperatively and received home physical therapies.  He continues to make overall improvement in his recovery and is walking substantial distance daily.  There continues to be some speech difficulties and possibly some cognitive impairment.  He does have a neurology follow-up scheduled in March.  He denies shortness of breath or chest pain.  He has had no difficulty with his incisions.  He has had no fevers, chills or other significant constitutional symptoms.  He is scheduled to see cardiology tomorrow.  He is noted to have a moderately elevated systolic blood  pressure on today's date.      Past Medical History:  Diagnosis Date  . Atrial myxoma 04/08/2018  . CVA (cerebral vascular accident) ([place]) 04/08/2018  . Dyslipidemia   . Hypertension   . s/p minimally invasive resection of left atrial myxoma 04/15/2018     Social History   Tobacco Use  Smoking Status Never Smoker  Smokeless Tobacco Never Used    Social History   Substance and Sexual Activity  Alcohol Use Yes   Comment: occasional     No Known Allergies  Current Outpatient Medications  Medication Sig Dispense Refill  . acetaminophen (TYLENOL) 325 MG tablet Take 650 mg by mouth every 6 (six) hours as needed for mild pain or headache.    Marland Kitchen amiodarone (PACERONE) 200 MG tablet Take 1 tablet (200 mg total) by mouth 2 (two) times daily. 60 tablet 1  . amLODipine (NORVASC) 5 MG tablet Take 1 tablet (5 mg total) by mouth daily. 30 tablet 3  . apixaban (ELIQUIS) 5 MG TABS tablet Take 1 tablet (5 mg total) by mouth 2 (two) times daily. 60 tablet 0  . metoprolol tartrate (LOPRESSOR) 50 MG tablet Take  1 tablet (50 mg total) by mouth 2 (two) times daily. 60 tablet 3  . pravastatin (PRAVACHOL) 80 MG tablet Take 80 mg by mouth daily.    . traMADol (ULTRAM) 50 MG tablet Take 1-2 tablets (50-100 mg total) by mouth every 4 (four) hours as needed for moderate pain. 30 tablet 0   No current facility-administered medications for this visit.         Physical Exam: BP (!) 142/89 (BP Location: Right Arm, Patient Position: Sitting, Cuff Size: Large)   Pulse 75   Resp 18   Ht 5\' 3"  (1.6 m)   Wt 80.3 kg   SpO2 96% Comment: RA  BMI 31.35 kg/m   General appearance: alert, cooperative and no distress Heart: regular rate and rhythm, no rub and no murmur Lungs: Mildly diminished in the right base Abdomen: Soft, nontender, positive bowel sounds, mild distention Extremities: No clubbing, cyanosis or edema. Wound: Incisions well-healed without evidence of infection   Diagnostic Studies &  Laboratory data:     Recent Radiology Findings:   Dg Chest 2 View  Result Date: 05/05/2018 CLINICAL DATA:  Status post left atrial myxoma resection. EXAM: CHEST - 2 VIEW COMPARISON:  04/21/2018 FINDINGS: Right pleural effusion with associated atelectasis, unchanged. The aeration of the right lung is slightly improved. Left lung is clear. No pneumothorax. Cardiomediastinal contours are normal. IMPRESSION: Unchanged small right pleural effusion with basilar atelectasis. Electronically Signed   By: Ulyses Jarred M.D.   On: 05/05/2018 14:28      Recent Lab Findings: Lab Results  Component Value Date   WBC 12.8 (H) 04/18/2018   HGB 10.9 (L) 04/18/2018   HCT 33.4 (L) 04/18/2018   PLT 238 04/18/2018   GLUCOSE 108 (H) 04/23/2018   CHOL 168 04/10/2018   TRIG 72 04/10/2018   HDL 36 (L) 04/10/2018   LDLCALC 118 (H) 04/10/2018   ALT 19 04/11/2018   AST 25 04/11/2018   NA 137 04/23/2018   K 4.5 04/23/2018   CL 102 04/23/2018   CREATININE 1.33 (H) 04/23/2018   BUN 15 04/23/2018   CO2 25 04/23/2018   TSH 1.586 04/09/2018   INR 1.10 04/21/2018   HGBA1C 5.5 04/11/2018      Assessment / Plan: The patient continues to make excellent postsurgical recovery from his left atrial myxoma resection.  He appears to be clinically improving in regards to his overall recovery from his CVA as well.  The patient did ask about driving but I was uncomfortable releasing him to do so at this point knowing that he may have some cognitive and physical impairments related to the stroke.  I have asked him to defer driving until he is seen in follow-up by neurology.  He is scheduled to see cardiology tomorrow so I will not adjust his medications for blood pressure based on single reading, he may require require adjustments but will defer to them.  His chest x-ray was reviewed and he does have a small right pleural effusion with some atelectasis.  This appears similar to previous study.  We will see the patient again in 2  months with a repeat chest x-ray at that time.          [name], PA-C 05/05/2018 2:48 PM

## 2018-05-06 ENCOUNTER — Encounter: Payer: Self-pay | Admitting: Cardiology

## 2018-05-06 ENCOUNTER — Ambulatory Visit (INDEPENDENT_AMBULATORY_CARE_PROVIDER_SITE_OTHER): Payer: BLUE CROSS/BLUE SHIELD | Admitting: Cardiology

## 2018-05-06 VITALS — BP 132/90 | HR 71 | Ht 63.0 in | Wt 181.8 lb

## 2018-05-06 DIAGNOSIS — D151 Benign neoplasm of heart: Secondary | ICD-10-CM | POA: Diagnosis not present

## 2018-05-06 DIAGNOSIS — I4892 Unspecified atrial flutter: Secondary | ICD-10-CM

## 2018-05-06 MED ORDER — AMIODARONE HCL 200 MG PO TABS: 200.0000 mg | ORAL_TABLET | Freq: Every day | ORAL | 3 refills | Status: DC

## 2018-05-06 NOTE — Progress Notes (Signed)
05/06/2018 Shawn Mccann   [birth year]  [id number]  Primary Physician Ocie Doyne., MD Primary Cardiologist: Fransico Him, MD  Electrophysiologist: None   Reason for Visit/CC: Post hospital follow-up status post minimally invasive left atrial myxoma resection and postoperative atrial fibrillation atrial flutter  HPI:  Shawn Mccann presents to clinic today for post hospital follow-up.    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 He required minimally invasive surgical resection by Dr. Roxy Manns on 04/15/18. LHC prior to surgery showed mild nonobstructive CAD. Echo with normal LVEF. Post operative course was complicated by atrial fibrillation and atrial flutter. He was placed on Eliquis and underwent DCCV on 04/22/18.  He was also evaluated by electrophysiology and recommendations were to continue on amiodarone for 4 to 6 weeks post cardioversion.  He is currently on 200 mg twice daily.    He presents to clinic today for post hospital f/u.  He is here with his brother-in-law who is helping with communication.  The patient still has some expressive dysphasia.  He has had PT and OT but they are still working on getting him set up with speech therapy.  He does not follow-up again with neurology for another 6 weeks.  He did have his postsurgical follow-up yesterday in Dr. Guy Sandifer office and was given a good report.  Despite his residual neurological deficits he has otherwise done fairly well.  He denies any chest pain or dyspnea.  No symptoms of breakthrough atrial fibrillation or atrial flutter.  EKG today shows normal sinus rhythm.  Heart rate 66 bpm.  Blood pressure is well controlled at 132/90.  He denies any side effects or intolerances with medications.  No abnormal bleeding with Eliquis.   Cardiac Studies   04/15/2018:  TEE Result status: Final result   Septum: No Patent Foramen Ovale present.  Left atrium: Patent foramen ovale not present.  Left atrium: Small pedunculated mass present consistent with myxoma. The mass is mobile and located on the interatrial septum.  Aortic valve: No AV vegetation. No evidence of papillary fibroelastoma.  Mitral valve: No leaflet thickening and calcification present. Trace regurgitation.  Right ventricle: Normal cavity size, wall thickness and ejection fraction. No thrombus present. No mass present.   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 These usually run posterior to the aorta and not intra-arterial. We were unable to confirm the course.  LAD contains mid 40% eccentric narrowing.  Right coronary is dominant and without obstruction.  Normal LVEDP.  No tumor blush is noted, hence possibly explaining fragmentation of the myxoma leading to multiple emboli.  Tachycardia and hypotension secondary to beta-blocker withdrawal  RECOMMENDATIONS:   Resume IV heparin in 4 hours without bolus.  Consider adding beta-blocker therapy to solve beta-blocker withdrawal.1     Current Meds  Medication Sig  . acetaminophen (TYLENOL) 325 MG tablet Take 650 mg by mouth every 6 (six) hours as needed for mild pain or headache.  Marland Kitchen amLODipine (NORVASC) 5 MG tablet Take 1 tablet (5 mg total) by mouth daily.  Marland Kitchen apixaban (ELIQUIS) 5 MG TABS tablet Take 1 tablet (5 mg total) by mouth 2 (two) times daily.  . metoprolol tartrate (LOPRESSOR) 50 MG tablet Take 1 tablet (50 mg total) by mouth 2 (two) times daily.  . pravastatin (PRAVACHOL) 80 MG  tablet Take 80 mg by mouth daily.  . traMADol (ULTRAM) 50 MG tablet Take 1-2 tablets (50-100 mg total) by mouth every 4 (four) hours as needed for moderate pain.  . [DISCONTINUED] amiodarone (PACERONE) 200 MG tablet Take 1 tablet (200 mg total) by mouth 2 (two) times daily.    No Known Allergies Past Medical History:  Diagnosis Date  . Atrial myxoma 04/08/2018  . CVA (cerebral vascular accident) ([place]) 04/08/2018  . Dyslipidemia   . Hypertension   . s/p minimally invasive resection of left atrial myxoma 04/15/2018   Family History  Problem Relation Age of Onset  . Lung cancer Mother 43  . CAD Father 43  . Hypertension Sister   . Hypertension Brother   . CVA Maternal Grandfather   . CVA Other    Past Surgical History:  Procedure Laterality Date  . CARDIOVERSION N/A 04/22/2018   Procedure: CARDIOVERSION;  Surgeon: Lelon Perla, MD;  Location: [facility] ENDOSCOPY;  Service: Cardiovascular;  Laterality: N/A;  . LEFT HEART CATH AND CORONARY ANGIOGRAPHY N/A 04/11/2018   Procedure: LEFT HEART CATH AND CORONARY ANGIOGRAPHY;  Surgeon: Belva Crome, MD;  Location: [place] CV LAB;  Service: Cardiovascular;  Laterality: N/A;  . MINIMALLY INVASIVE EXCISION OF ATRIAL MYXOMA N/A 04/15/2018   Procedure: MINIMALLY INVASIVE EXCISION OF LEFT ATRIAL MYXOMA;  Surgeon: Rexene Alberts, MD;  Location: [place];  Service: Open Heart Surgery;  Laterality: N/A;  . NO PAST SURGERIES    . TEE WITHOUT CARDIOVERSION N/A 04/10/2018   Procedure: TRANSESOPHAGEAL ECHOCARDIOGRAM (TEE);  Surgeon: Sanda Klein, MD;  Location: [place];  Service: Cardiovascular;  Laterality: N/A;  . TEE WITHOUT CARDIOVERSION N/A 04/15/2018   Procedure: TRANSESOPHAGEAL ECHOCARDIOGRAM (TEE);  Surgeon: Rexene Alberts, MD;  Location: [place];  Service: Open Heart Surgery;  Laterality: N/A;  . TEE WITHOUT CARDIOVERSION N/A 04/22/2018   Procedure: TRANSESOPHAGEAL ECHOCARDIOGRAM (TEE);  Surgeon: Lelon Perla, MD;  Location: [facility] ENDOSCOPY;  Service: Cardiovascular;  Laterality: N/A;   Social History   Socioeconomic History  . Marital status: Single    Spouse name: Not on file  . Number of children: Not on file  . Years of education: Not on file  . Highest education level: Not on file  Occupational  History  . Occupation: Games developer  Social Needs  . Financial resource strain: Not on file  . Food insecurity:    Worry: Not on file    Inability: Not on file  . Transportation needs:    Medical: Not on file    Non-medical: Not on file  Tobacco Use  . Smoking status: Never Smoker  . Smokeless tobacco: Never Used  Substance and Sexual Activity  . Alcohol use: Yes    Comment: occasional  . Drug use: Never  . Sexual activity: Not on file  Lifestyle  . Physical activity:    Days per week: Not on file    Minutes per session: Not on file  . Stress: Not on file  Relationships  . Social connections:    Talks on phone: Not on file    Gets together: Not on file    Attends religious service: Not on file    Active member of club or organization: Not on file    Attends meetings of clubs or organizations: Not on file    Relationship status: Not on file  . Intimate partner violence:    Fear of current or ex partner: Not on file    Emotionally abused:  Not on file    Physically abused: Not on file    Forced sexual activity: Not on file  Other Topics Concern  . Not on file  Social History Narrative  . Not on file     Lipid Panel     Component Value Date/Time   CHOL 168 04/10/2018 0519   TRIG 72 04/10/2018 0519   HDL 36 (L) 04/10/2018 0519   CHOLHDL 4.7 04/10/2018 0519   VLDL 14 04/10/2018 0519   LDLCALC 118 (H) 04/10/2018 0519    Review of Systems: General: negative for chills, fever, night sweats or weight changes.  Cardiovascular: negative for chest pain, dyspnea on exertion, edema, orthopnea, palpitations, paroxysmal nocturnal dyspnea or shortness of breath Dermatological: negative for rash Respiratory: negative for cough or wheezing Urologic: negative for hematuria Abdominal: negative for nausea, vomiting, diarrhea, bright red blood per rectum, melena, or hematemesis Neurologic: negative for visual changes, syncope, or dizziness All other systems reviewed and are  otherwise negative except as noted above.   Physical Exam:  Blood pressure 132/90, pulse 71, height 5\' 3"  (1.6 m), weight 181 lb 12.8 oz (82.5 kg), SpO2 97 %.  General appearance: alert, cooperative and no distress Neck: no carotid bruit and no JVD Lungs: clear to auscultation bilaterally Heart: regular rate and rhythm, S1, S2 normal, no murmur, click, rub or gallop Extremities: extremities normal, atraumatic, no cyanosis or edema Pulses: 2+ and symmetric Skin: Skin color, texture, turgor normal. No rashes or lesions Neurologic: Expressive dysphasia   EKG NSR 66 bpm -- personally reviewed   ASSESSMENT AND PLAN:   1.  Status post left atrial myxoma resection: Minimally invasive resection done by Dr. Maurie Boettcher April 15, 2018.  He had his postoperative follow-up yesterday with CT surgery and was given a good report.   2.  Postoperative atrial fibrillation/atrial flutter: Status post direct-current cardioversion April 22, 2018.  Per EP recommendations, he should continue amiodarone therapy for another 4 weeks.  EKG today shows normal sinus rhythm.  Heart rate well controlled.  We will reduce amiodarone down from 200 mg twice daily to 200 mg once daily.  We will have him follow-up again in 4 weeks for an RN visit for repeat EKG.  If he continues to maintain normal sinus rhythm, we will discontinue amiodarone at that time.  Recommend continuation of Eliquis indefinitely.  3.  Hypertension: Well-controlled on current regimen.  Continue metoprolol and amlodipine  4. HLD: Continue statin therapy with pravastatin.  5. CVA: per above. He has residual expressive dysphasia. Plan is for speech therapy. He has f/u with neurology in 6 weeks.    Follow-Up for nursing care visit in 4 weeks for repeat EKG.  If he is still maintaining normal sinus rhythm we will plan to discontinue amiodarone at that time.  Follow-up with Dr. Radford Pax in 3 to 4 months.  Zae Kirtz Ladoris Gene, MHS [facility] 05/06/2018 11:14 AM

## 2018-05-06 NOTE — Patient Instructions (Signed)
Medication Instructions:  START AMIODARONE 200 mg DAILY If you need a refill on your cardiac medications before your next appointment, please call your pharmacy.   Lab work: NONE If you have labs (blood work) drawn today and your tests are completely normal, you will receive your results only by: Marland Kitchen MyChart Message (if you have MyChart) OR . A paper copy in the mail If you have any lab test that is abnormal or we need to change your treatment, we will call you to review the results.  Testing/Procedures: NONE  Follow-Up:  PLEASE SCHEDULE  NURSE VISIT 4 WEEKS FOR EKG Dr Radford Pax 3 MONTHS  At [facility], you and your health needs are our priority.  As part of our continuing mission to provide you with exceptional heart care, we have created designated Provider Care Teams.  These Care Teams include your primary Cardiologist (physician) and Advanced Practice Providers (APPs -  Physician Assistants and Nurse Practitioners) who all work together to provide you with the care you need, when you need it. .   Any Other Special Instructions Will Be Listed Below (If Applicable).

## 2018-05-15 ENCOUNTER — Other Ambulatory Visit (HOSPITAL_COMMUNITY): Payer: Self-pay | Admitting: Physician Assistant

## 2018-06-06 ENCOUNTER — Telehealth: Payer: Self-pay | Admitting: Adult Health

## 2018-06-06 ENCOUNTER — Other Ambulatory Visit: Payer: Self-pay | Admitting: *Deleted

## 2018-06-06 MED ORDER — APIXABAN 5 MG PO TABS: 5.0000 mg | ORAL_TABLET | Freq: Two times a day (BID) | ORAL | 5 refills | Status: DC

## 2018-06-06 NOTE — Telephone Encounter (Signed)
I called pts sister Dorian Pod that we are r/s pts till May 2020 who we cant do tele phone or video visit.Meda Coffee stated Janett Billow NP prefers face to face due to pts expressive aphasia. Dorian Pod understood.

## 2018-06-06 NOTE — Telephone Encounter (Signed)
Last OV 05/06/18 Scr 1.33 on 04/23/2018

## 2018-06-06 NOTE — Telephone Encounter (Signed)
Pt's sister called to c/a appt on Monday 3/30- I advised of telemedicine, she gave consent and consent to file insurance. Please call to advise

## 2018-06-09 ENCOUNTER — Inpatient Hospital Stay: Payer: BLUE CROSS/BLUE SHIELD | Admitting: Adult Health

## 2018-06-09 ENCOUNTER — Ambulatory Visit: Payer: BLUE CROSS/BLUE SHIELD

## 2018-06-30 ENCOUNTER — Other Ambulatory Visit: Payer: Self-pay | Admitting: Physician Assistant

## 2018-07-02 ENCOUNTER — Telehealth: Payer: Self-pay | Admitting: Cardiology

## 2018-07-02 NOTE — Telephone Encounter (Signed)
Unable to reach/ 07/02/18 to set up [email or web address]

## 2018-07-02 NOTE — Telephone Encounter (Signed)
Patient is doing fine wanted to r/s in Aug.   Schedule for 10/29/18 @ 9:40.

## 2018-07-07 ENCOUNTER — Ambulatory Visit: Payer: BLUE CROSS/BLUE SHIELD | Admitting: Cardiology

## 2018-07-07 ENCOUNTER — Ambulatory Visit: Payer: BLUE CROSS/BLUE SHIELD | Admitting: Thoracic Surgery (Cardiothoracic Vascular Surgery)

## 2018-07-24 ENCOUNTER — Telehealth: Payer: Self-pay

## 2018-07-24 NOTE — Telephone Encounter (Signed)
My note from March 27 states you wanted to see pt in office due to aphasia. Pt was reschedule from March 2020 for office visit.

## 2018-07-24 NOTE — Telephone Encounter (Signed)
Was she unable to do a VV?

## 2018-07-24 NOTE — Telephone Encounter (Signed)
Pt's sister returned call and stated she should be able to get pt gloves and mask. Pt has not been out of the state or exposed to COVID-19 and does not have a respiratory problem according to sister. Sister understood that only he will be allowed in exam room. Sister will send pt in with a letter of some of her questions and concerns since she will not be able to be in the room with pt.

## 2018-07-24 NOTE — Telephone Encounter (Signed)
If pts sister Dorian Pod call back please give message that his appt is May 19 ,2020 at 1015am with Janett Billow NP. Pt will need to check in at 0945am. He will have to wear a pair of gloves and mask to wear. We need to know if pt has travel outside the state or been exposed to COVID 19 or had any respiratory problems. If pt does not have a mask or gloves we can provide. Only pt is allowed in exam room with provider.  LEft message with the details above for sister Dorian Pod.

## 2018-07-29 ENCOUNTER — Ambulatory Visit (INDEPENDENT_AMBULATORY_CARE_PROVIDER_SITE_OTHER): Payer: BLUE CROSS/BLUE SHIELD | Admitting: Adult Health

## 2018-07-29 ENCOUNTER — Encounter: Payer: Self-pay | Admitting: Adult Health

## 2018-07-29 ENCOUNTER — Other Ambulatory Visit: Payer: Self-pay

## 2018-07-29 VITALS — BP 150/93 | HR 64 | Temp 97.5°F | Ht 65.0 in | Wt 172.0 lb

## 2018-07-29 DIAGNOSIS — R4701 Aphasia: Secondary | ICD-10-CM

## 2018-07-29 DIAGNOSIS — E785 Hyperlipidemia, unspecified: Secondary | ICD-10-CM

## 2018-07-29 DIAGNOSIS — I480 Paroxysmal atrial fibrillation: Secondary | ICD-10-CM

## 2018-07-29 DIAGNOSIS — I63412 Cerebral infarction due to embolism of left middle cerebral artery: Secondary | ICD-10-CM | POA: Diagnosis not present

## 2018-07-29 DIAGNOSIS — I10 Essential (primary) hypertension: Secondary | ICD-10-CM

## 2018-07-29 DIAGNOSIS — D151 Benign neoplasm of heart: Secondary | ICD-10-CM | POA: Diagnosis not present

## 2018-07-29 MED ORDER — ASPIRIN EC 325 MG PO TBEC: 325.0000 mg | DELAYED_RELEASE_TABLET | Freq: Every day | ORAL | 0 refills | Status: DC

## 2018-07-29 NOTE — Patient Instructions (Addendum)
Start aspirin 325mg  daily and continue pravastatin  for secondary stroke prevention  Schedule follow up appt with cardiologist Dr. Heron Nay to start a different blood thinner for your atrial fibrillation such as Xarelto or Coumadin.You can call the office to schedule a visit at [phone]  Continue to follow up with PCP regarding cholesterol and blood pressure management   Referral placed for speech therapy due to continued speech difficulties  Continue to stay active and maintain a healthy diet  Continue to monitor blood pressure at home  Maintain strict control of hypertension with blood pressure goal below 130/90, diabetes with hemoglobin A1c goal below 6.5% and cholesterol with LDL cholesterol (bad cholesterol) goal below 70 mg/dL. I also advised the patient to eat a healthy diet with plenty of whole grains, cereals, fruits and vegetables, exercise regularly and maintain ideal body weight.  Followup in the future with me in 4 months or call earlier if needed       Thank you for coming to see [place] at [facility]. I hope we have been able to provide you high quality care today.  You may receive a patient satisfaction survey over the next few weeks. We would appreciate your feedback and comments so that we may continue to improve ourselves and the health of our patients.

## 2018-07-29 NOTE — Progress Notes (Signed)
[facility] [address] [place]. [place] [address] [phone]       OFFICE FOLLOW UP NOTE  Mr. Shawn Mccann Date of Birth:  1955-11-23 Medical Record Number:  [id number]   Reason for Referral:  hospital stroke follow up   CHIEF COMPLAINT:  Chief Complaint  Patient presents with  . 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 History obtained from patient and chart review. Reviewed all radiology images and labs personally.  Mr. Shawn Mccann is a 63 y.o. male with history of HTN, HLD   who presented to [facility] from urgent care with difficulty speaking and expressive aphasia.   Transferred to [facility] when myxoma found on echo. Stroke work-up revealed multiple bilateral infarcts in desperate vascular territories with MRI showing left frontal small infarcts involving left MCA/PCA, left frontal parietal punctate infarcts in the dominant abnormality remains left frontal opercular cortex without visible hemorrhage.  Vessel imaging unremarkable.  2D echo EF 60 to 65% with 2.2 x 1.3 cm echodense mass likely myxoma.  TEE showed left atrial mass typical for myxoma.  Multiple bilateral infarcts secondary to newly identified myxoma with plan of surgical resection the following week.  Recommended antiplatelet regimen as per cardiology.  HTN stable.  LDL 118 and initiated atorvastatin 40 mg daily.  Other stroke risk factors include EtOH use and family history of stroke but no personal history of stroke.  He underwent surgical procedure on 04/15/2018 with postop course complicated by atrial fibrillation and atrial flutter therefore placed on Eliquis and underwent DCCV on 04/22/2018.  He has been doing well from a stroke standpoint with residual deficits of mild to moderate expressive aphasia but does endorse  improvement.  He denies any cognitive or memory concerns.  He previously participated in PT/OT but denies participating in speech therapy.  He discontinued use of Eliquis in March as he states this medication made him feel "flighty" and lightheaded.  He states since discontinuing, the symptoms resolved.  He had an appointment scheduled with cardiology in 07/02/2018 but per epic notes, he agreed to rescheduling until 10/2018 as he was stable without mention of discontinuing Eliquis.  He continues on pravastatin without side effects myalgias.  Monitors BP at home which has been stable.  Blood pressure today 150/93.  No further concerns at this time.  Denies new or worsening stroke/TIA symptoms.     ROS:   14 system review of systems performed and negative with exception of speech difficulty  PMH:  Past Medical History:  Diagnosis Date  . Atrial myxoma 04/08/2018  . CVA (cerebral vascular accident) ([place]) 04/08/2018  . Dyslipidemia   . Hypertension   . s/p minimally invasive resection of left atrial myxoma 04/15/2018    PSH:  Past Surgical History:  Procedure Laterality Date  . CARDIOVERSION N/A 04/22/2018   Procedure: CARDIOVERSION;  Surgeon: Lelon Perla, MD;  Location: [facility] ENDOSCOPY;  Service: Cardiovascular;  Laterality: N/A;  . LEFT HEART CATH AND CORONARY ANGIOGRAPHY N/A 04/11/2018   Procedure: LEFT HEART CATH AND CORONARY ANGIOGRAPHY;  Surgeon: Belva Crome, MD;  Location: [facility] CV LAB;  Service: Cardiovascular;  Laterality: N/A;  . MINIMALLY INVASIVE EXCISION OF ATRIAL MYXOMA N/A 04/15/2018   Procedure: MINIMALLY INVASIVE EXCISION OF LEFT ATRIAL MYXOMA;  Surgeon: Rexene Alberts, MD;  Location: [place];  Service:  Open Heart Surgery;  Laterality: N/A;  . NO PAST SURGERIES    . TEE WITHOUT CARDIOVERSION N/A 04/10/2018   Procedure: TRANSESOPHAGEAL ECHOCARDIOGRAM (TEE);  Surgeon: Sanda Klein, MD;  Location: [place];  Service: Cardiovascular;  Laterality: N/A;  . TEE WITHOUT  CARDIOVERSION N/A 04/15/2018   Procedure: TRANSESOPHAGEAL ECHOCARDIOGRAM (TEE);  Surgeon: Rexene Alberts, MD;  Location: [place];  Service: Open Heart Surgery;  Laterality: N/A;  . TEE WITHOUT CARDIOVERSION N/A 04/22/2018   Procedure: TRANSESOPHAGEAL ECHOCARDIOGRAM (TEE);  Surgeon: Lelon Perla, MD;  Location: [facility] ENDOSCOPY;  Service: Cardiovascular;  Laterality: N/A;    Social History:  Social History   Socioeconomic History  . Marital status: Single    Spouse name: Not on file  . Number of children: Not on file  . Years of education: Not on file  . Highest education level: Not on file  Occupational History  . Occupation: Games developer  Social Needs  . Financial resource strain: Not on file  . Food insecurity:    Worry: Not on file    Inability: Not on file  . Transportation needs:    Medical: Not on file    Non-medical: Not on file  Tobacco Use  . Smoking status: Never Smoker  . Smokeless tobacco: Never Used  Substance and Sexual Activity  . Alcohol use: Yes    Comment: occasional  . Drug use: Never  . Sexual activity: Not on file  Lifestyle  . Physical activity:    Days per week: Not on file    Minutes per session: Not on file  . Stress: Not on file  Relationships  . Social connections:    Talks on phone: Not on file    Gets together: Not on file    Attends religious service: Not on file    Active member of club or organization: Not on file    Attends meetings of clubs or organizations: Not on file    Relationship status: Not on file  . Intimate partner violence:    Fear of current or ex partner: Not on file    Emotionally abused: Not on file    Physically abused: Not on file    Forced sexual activity: Not on file  Other Topics Concern  . Not on file  Social History Narrative  . Not on file    Family History:  Family History  Problem Relation Age of Onset  . Lung cancer Mother 58  . CAD Father 39  . Hypertension Sister   . Hypertension Brother   .  CVA Maternal Grandfather   . CVA Other     Medications:   Current Outpatient Medications on File Prior to Visit  Medication Sig Dispense Refill  . acetaminophen (TYLENOL) 325 MG tablet Take 650 mg by mouth every 6 (six) hours as needed for mild pain or headache.    Marland Kitchen amiodarone (PACERONE) 200 MG tablet Take 1 tablet (200 mg total) by mouth daily. 90 tablet 3  . amLODipine (NORVASC) 5 MG tablet Take 1 tablet (5 mg total) by mouth daily. 30 tablet 3  . metoprolol tartrate (LOPRESSOR) 50 MG tablet Take 1 tablet (50 mg total) by mouth 2 (two) times daily. 60 tablet 3  . pravastatin (PRAVACHOL) 80 MG tablet Take 80 mg by mouth daily.    . traMADol (ULTRAM) 50 MG tablet Take 1-2 tablets (50-100 mg total) by mouth every 4 (four) hours as needed for moderate pain. 30 tablet 0   No current  facility-administered medications on file prior to visit.     Allergies:  No Known Allergies   Physical Exam  Vitals:   07/29/18 1020  BP: (!) 150/93  Pulse: 64  Temp: (!) 97.5 F (36.4 C)  Weight: 172 lb (78 kg)  Height: 5\' 5"  (1.651 m)   Body mass index is 28.62 kg/m. No exam data present  No flowsheet data found.   General: well developed, well nourished,  pleasant middle-aged Caucasian male, seated, in no evident distress Head: head normocephalic and atraumatic.   Neck: supple with no carotid or supraclavicular bruits Cardiovascular: regular rate and rhythm, no murmurs Musculoskeletal: no deformity Skin:  no rash/petichiae Vascular:  Normal pulses all extremities   Neurologic Exam Mental Status: Awake and fully alert.   Mild to moderate expressive aphasia.  Oriented to place and time. Recent and remote memory intact. Attention span, concentration and fund of knowledge appropriate. Mood and affect appropriate.  Cranial Nerves: Fundoscopic exam reveals sharp disc margins. Pupils equal, briskly reactive to light. Extraocular movements full without nystagmus. Visual fields full to  confrontation. Hearing intact. Facial sensation intact. Face, tongue, palate moves normally and symmetrically.  Motor: Normal bulk and tone. Normal strength in all tested extremity muscles. Sensory.: intact to touch , pinprick , position and vibratory sensation.  Coordination: Rapid alternating movements normal in all extremities. Finger-to-nose and heel-to-shin performed accurately bilaterally. Gait and Station: Arises from chair without difficulty. Stance is normal. Gait demonstrates  normal stride length and balance. Reflexes: 1+ and symmetric. Toes downgoing.    NIHSS  1 Modified Rankin  2 CHA2DS2-VASc 4 HAS-BLED 1     ASSESSMENT: Shawn Mccann is a 63 y.o. 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  Underwent surgical resection 04/21/7987 with complication post procedure of atrial fibrillation and atrial flutter with initiation of Eliquis and DCCV.  Vascular risk factors include HTN and HLD.  Residual deficits of expressive aphasia.  Recently self discontinued Eliquis due to side effects.    PLAN:  1. Multiple embolic infarcts: Start aspirin 325 mg daily  and continue pravastatin for secondary stroke prevention.  Stressed the importance of following with cardiology as it is recommended anticoagulation long-term. maintain strict control of hypertension with blood pressure goal below 130/90, diabetes with hemoglobin A1c goal below 6.5% and cholesterol with LDL cholesterol (bad cholesterol) goal below 70 mg/dL.  I also advised the patient to eat a healthy diet with plenty of whole grains, cereals, fruits and vegetables, exercise regularly with at least 30 minutes of continuous activity daily and maintain ideal body weight. 2. Atrial fibrillation: Provided patient with Dr. Theodosia Blender office number to schedule sooner follow-up appointment to discuss initiating anticoagulant therapy as he discontinued Eliquis in 05/2018.  Educated  patient on importance of anticoagulation with atrial fibrillation for secondary stroke prevention as well as history of cardiac myxoma 3. HTN: Advised to continue current treatment regimen.  Advised to continue to monitor at home along with continued follow-up with PCP for management 4. HLD: Advised to continue current treatment regimen along with continued follow-up with PCP for future prescribing and monitoring of lipid panel 5. Expressive aphasia: Referral placed to speech therapy due to residual deficits    Follow up in 4 months or call earlier if needed   Greater than 50% of time during this 45 minute visit was spent on counseling, explanation of diagnosis of multiple embolic infarcts, reviewing risk factor management of atrial fibrillation, HTN and  HLD, planning of further management along with potential future management, and discussion with patient and family answering all questions.    Venancio Poisson, AGNP-BC  [facility] [address] [place], [place] [address]  Phone [phone] Fax [phone] Note: This document was prepared with digital dictation and possible smart phrase technology. Any transcriptional errors that result from this process are unintentional.

## 2018-07-30 ENCOUNTER — Telehealth: Payer: Self-pay | Admitting: Cardiology

## 2018-07-30 ENCOUNTER — Telehealth: Payer: Self-pay

## 2018-07-30 NOTE — Telephone Encounter (Signed)
New Message  07-30-18/9:15am. Called Pt to confirm phone number and doximity process for video visit with Simmons,PA-C on 07-31-18. Pt states to use [phone]. Shawn Mccann val

## 2018-07-30 NOTE — Progress Notes (Signed)
Virtual Visit via Video Note   This visit type was conducted due to national recommendations for restrictions regarding the COVID-19 Pandemic (e.g. social distancing) in an effort to limit this patient's exposure and mitigate transmission in our community.  Due to his co-morbid illnesses, this patient is at least at moderate risk for complications without adequate follow up.  This format is felt to be most appropriate for this patient at this time.  All issues noted in this document were discussed and addressed.  A limited physical exam was performed with this format.  Please refer to the patient's chart for his consent to telehealth for [facility].   Date:  07/31/2018   ID:  Shawn Mccann, DOB 1955/06/29, MRN [id number]  Patient Location: Home Provider Location: Office  PCP:  Ocie Doyne., MD  Cardiologist:  Fransico Him, MD  Electrophysiologist:  None   Evaluation Performed:  Follow-Up Visit  Chief Complaint:  F/u for H/o left atrial myxoma and atrial fibrillation   History of Present Illness:    Shawn Mccann is a 63 y.o. male from [facility], with a history of hypertension and dyslipidemia, recently admitted this past January for stroke (presenting symptoms was expressive dysphasia) and work up revealing a left atrial myxoma. He required minimally invasive surgical resection by Dr. Roxy Manns on 04/15/18. LHC prior to surgery showed mild nonobstructive CAD. Echo with normal LVEF. Post operative course was complicated by atrial fibrillation and atrial flutter. He was placed on Eliquis and underwent DCCV on 04/22/18.  He was also evaluated by electrophysiology and recommendations were to continue on amiodarone for 4 to 6 weeks post cardioversion.     I had evaluated him in late February for post hospital f/u.  He was doing well from a cardiovascular and post surgical standpoint, but still had some expressive dysphasia. His EKG showed NSR. HR 66 bpm. BP was controlled at 132/90 and  he denied any symptoms of breakthrough afib. He reported full compliance w/ Eliquis, at that time. Recommendations were to have him follow-up again 4 weeks later for an RN visit for repeat EKG with intention to d/c amiodarone if still in NSR. Recommendations were to continue Eliquis indefinitely. He presented for repeat EKG and was in NSR. Amiodarone was discontinued. Plan was to f/u again in 3 months.   Pt had recent f/u with neurology on 5/19 and pt reported that he had self discontinued Eliquis.  He reported to neurology that he discontinued use of Eliquis in March as he states this medication made him feel "flighty" and lightheaded.  He states since discontinuing, the symptoms resolved.  He has otherwise done well from stroke standpoint with only residual deficits of mild to moderate expressive aphasia but does endorse improvement.  He denies any cardiac symptoms. No palpitations, CP, dyspnea, syncope/ near syncope, dizziness, LEE, orthopnea and PND. His speech is notably better. He also reports that he has adjusted his diet and has been exercising and subsequently lost 20 lb since his stroke.   The patient does not have symptoms concerning for COVID-19 infection (fever, chills, cough, or new shortness of breath).    Past Medical History:  Diagnosis Date  . Atrial myxoma 04/08/2018  . CVA (cerebral vascular accident) ([place]) 04/08/2018  . Dyslipidemia   . Hypertension   . s/p minimally invasive resection of left atrial myxoma 04/15/2018   Past Surgical History:  Procedure Laterality Date  . CARDIOVERSION N/A 04/22/2018   Procedure: CARDIOVERSION;  Surgeon: Lelon Perla, MD;  Location: [facility] ENDOSCOPY;  Service: Cardiovascular;  Laterality: N/A;  . LEFT HEART CATH AND CORONARY ANGIOGRAPHY N/A 04/11/2018   Procedure: LEFT HEART CATH AND CORONARY ANGIOGRAPHY;  Surgeon: Belva Crome, MD;  Location: [place] CV LAB;  Service: Cardiovascular;  Laterality: N/A;  . MINIMALLY INVASIVE EXCISION OF  ATRIAL MYXOMA N/A 04/15/2018   Procedure: MINIMALLY INVASIVE EXCISION OF LEFT ATRIAL MYXOMA;  Surgeon: Rexene Alberts, MD;  Location: [place];  Service: Open Heart Surgery;  Laterality: N/A;  . NO PAST SURGERIES    . TEE WITHOUT CARDIOVERSION N/A 04/10/2018   Procedure: TRANSESOPHAGEAL ECHOCARDIOGRAM (TEE);  Surgeon: Sanda Klein, MD;  Location: [place];  Service: Cardiovascular;  Laterality: N/A;  . TEE WITHOUT CARDIOVERSION N/A 04/15/2018   Procedure: TRANSESOPHAGEAL ECHOCARDIOGRAM (TEE);  Surgeon: Rexene Alberts, MD;  Location: [place];  Service: Open Heart Surgery;  Laterality: N/A;  . TEE WITHOUT CARDIOVERSION N/A 04/22/2018   Procedure: TRANSESOPHAGEAL ECHOCARDIOGRAM (TEE);  Surgeon: Lelon Perla, MD;  Location: [facility] ENDOSCOPY;  Service: Cardiovascular;  Laterality: N/A;     Current Meds  Medication Sig  . amLODipine (NORVASC) 5 MG tablet Take 1 tablet (5 mg total) by mouth daily.  Marland Kitchen aspirin EC 325 MG tablet Take 1 tablet (325 mg total) by mouth daily.  . metoprolol tartrate (LOPRESSOR) 50 MG tablet Take 1 tablet (50 mg total) by mouth 2 (two) times daily.  . pravastatin (PRAVACHOL) 80 MG tablet Take 80 mg by mouth daily.  . [DISCONTINUED] amiodarone (PACERONE) 200 MG tablet Take 1 tablet (200 mg total) by mouth daily.     Allergies:   Patient has no known allergies.   Social History   Tobacco Use  . Smoking status: Never Smoker  . Smokeless tobacco: Never Used  Substance Use Topics  . Alcohol use: Yes    Comment: occasional  . Drug use: Never     Family Hx: The patient's family history includes CAD (age of onset: 37) in his father; CVA in his maternal grandfather and another family member; Hypertension in his brother and sister; Lung cancer (age of onset: 46) in his mother.  ROS:   Please see the history of present illness.     All other systems reviewed and are negative.   Prior CV studies:   The following studies were reviewed today:  04/15/2018: TEE Result  status: Final result   Septum: No Patent Foramen Ovale present.  Left atrium: Patent foramen ovale not present.  Left atrium: Small pedunculated mass present consistent with myxoma. The mass is mobile and located on the interatrial septum.  Aortic valve: No AV vegetation. No evidence of papillary fibroelastoma.  Mitral valve: No leaflet thickening and calcification present. Trace regurgitation.  Right ventricle: Normal cavity size, wall thickness and ejection fraction. No thrombus present. No mass present.   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 These usually run posterior to the aorta and not intra-arterial. We were unable to confirm the course.  LAD contains mid 40% eccentric narrowing.  Right coronary is dominant and without obstruction.  Normal LVEDP.  No tumor blush is noted, hence possibly explaining fragmentation of the myxoma leading to multiple emboli.  Tachycardia and hypotension secondary to beta-blocker withdrawal  RECOMMENDATIONS:   Resume IV heparin in 4 hours without bolus.  Consider adding beta-blocker therapy to solve beta-blocker withdrawal.1   Labs/Other Tests and Data Reviewed:    EKG:  An ECG dated 05/06/18 was personally  reviewed today and demonstrated:  NSR 66 bpm  Recent Labs: 04/09/2018: TSH 1.586 04/11/2018: ALT 19 04/16/2018: Magnesium 2.2 04/18/2018: Hemoglobin 10.9; Platelets 238 04/23/2018: BUN 15; Creatinine, Ser 1.33; Potassium 4.5; Sodium 137   Recent Lipid Panel Lab Results  Component Value Date/Time   CHOL 168 04/10/2018 05:19 AM   TRIG 72 04/10/2018 05:19 AM   HDL 36 (L) 04/10/2018 05:19 AM   CHOLHDL 4.7 04/10/2018 05:19 AM   LDLCALC 118 (H) 04/10/2018 05:19 AM    Wt Readings from Last 3 Encounters:  07/31/18 170 lb (77.1 kg)  07/29/18 172 lb (78 kg)  05/06/18 181 lb 12.8 oz (82.5 kg)     Objective:    Vital Signs:  BP 135/81   Pulse 61   Ht 5\' 5"  (1.651 m)    Wt 170 lb (77.1 kg)   BMI 28.29 kg/m    VITAL SIGNS:  reviewed GEN:  no acute distress EYES:  sclerae anicteric, EOMI - Extraocular Movements Intact RESPIRATORY:  normal respiratory effort, symmetric expansion CARDIOVASCULAR:  no peripheral edema SKIN:  no rash, lesions or ulcers. MUSCULOSKELETAL:  no obvious deformities. NEURO:  alert and oriented x 3, no obvious focal deficit PSYCH:  normal affect  ASSESSMENT & PLAN:    1.  Status post left atrial myxoma resection: Minimally invasive resection done by Dr. Maurie Boettcher April 15, 2018.  No post op issues. Plan is to f/u annually w/ Dr. Roxy Manns.   2.  Postoperative atrial fibrillation/atrial flutter: Status post direct-current cardioversion April 22, 2018.  He required short term use of amiodarone. Last EKG 2/25 showed NSR. He denies any symptoms of recurrent afib. No palpitations. HR is well controlled in the 60s w/ metoprolol. It was recommended that Eliquis be continued indefinitely but pt self discontinued due to side effects. We discussed Xarelto. He is willing to try this. His last set of labs were in Feb and CrCl was borderline for dose adjustment at 56 mL/min. Will repeat labs to recheck CrCl to determine appropriate dose of Xarelto. Will send Rx to pharmacy.   3.  Hypertension: Well-controlled on current regimen. 135/81 today. Pulse rate 61 bpm. Continue metoprolol and amlodipine  4. HLD: LDL in Jan was 118 mg/dL. Stated on Pravastatin. Tolerating statin well w/o side effects. He has also lost 20 lb, which should be beneficial.  He has mild CAD. Will check FLP and HFTs.  5. CVA: per above. He has residual expressive dysphasia but this has improved. He is followed by neurology.  6. CAD:  Mild CAD noted on cath in 03/2018. Continue statin. No ASA given anticoagulation. Continue  blocker. HR and BP both well controlled.   COVID-19 Education: The signs and symptoms of COVID-19 were discussed with the patient and how to seek care for  testing (follow up with PCP or arrange E-visit).  The importance of social distancing was discussed today.  Time:   Today, I have spent 17 minutes with the patient with telehealth technology discussing the above problems.     Medication Adjustments/Labs and Tests Ordered: Current medicines are reviewed at length with the patient today.  Concerns regarding medicines are outlined above.   Tests Ordered: No orders of the defined types were placed in this encounter.   Medication Changes: No orders of the defined types were placed in this encounter.   Disposition:  Follow up with Dr. Radford Pax in August.   Signed, Nelida Gores  07/31/2018 10:03 AM    [place]

## 2018-07-30 NOTE — Telephone Encounter (Signed)

## 2018-07-30 NOTE — Progress Notes (Signed)
Okay thank you

## 2018-07-31 ENCOUNTER — Other Ambulatory Visit: Payer: Self-pay

## 2018-07-31 ENCOUNTER — Encounter: Payer: Self-pay | Admitting: Cardiology

## 2018-07-31 ENCOUNTER — Telehealth (INDEPENDENT_AMBULATORY_CARE_PROVIDER_SITE_OTHER): Payer: BLUE CROSS/BLUE SHIELD | Admitting: Cardiology

## 2018-07-31 VITALS — BP 135/81 | HR 61 | Ht 65.0 in | Wt 170.0 lb

## 2018-07-31 DIAGNOSIS — I2510 Atherosclerotic heart disease of native coronary artery without angina pectoris: Secondary | ICD-10-CM

## 2018-07-31 DIAGNOSIS — Z8673 Personal history of transient ischemic attack (TIA), and cerebral infarction without residual deficits: Secondary | ICD-10-CM

## 2018-07-31 DIAGNOSIS — Z86018 Personal history of other benign neoplasm: Secondary | ICD-10-CM | POA: Diagnosis not present

## 2018-07-31 DIAGNOSIS — I480 Paroxysmal atrial fibrillation: Secondary | ICD-10-CM

## 2018-07-31 DIAGNOSIS — E782 Mixed hyperlipidemia: Secondary | ICD-10-CM

## 2018-07-31 DIAGNOSIS — I10 Essential (primary) hypertension: Secondary | ICD-10-CM

## 2018-07-31 NOTE — Patient Instructions (Addendum)
Medication Instructions:  Start Xarelto pending labs If you need a refill on your cardiac medications before your next appointment, please call your pharmacy.   Lab work: At Dr Viviann Spare office Tuesday 5/26 CMP CBC Fasting Lipid Profile  If you have labs (blood work) drawn today and your tests are completely normal, you will receive your results only by: Marland Kitchen MyChart Message (if you have MyChart) OR . A paper copy in the mail If you have any lab test that is abnormal or we need to change your treatment, we will call you to review the results.  Testing/Procedures: none  Follow-Up: As Scheduled with Dr Radford Pax At [facility], you and your health needs are our priority.  As part of our continuing mission to provide you with exceptional heart care, we have created designated Provider Care Teams.  These Care Teams include your primary Cardiologist (physician) and Advanced Practice Providers (APPs -  Physician Assistants and Nurse Practitioners) who all work together to provide you with the care you need, when you need it. .   Any Other Special Instructions Will Be Listed Below (If Applicable).

## 2018-08-01 NOTE — Progress Notes (Signed)
I agree with the above plan 

## 2018-08-05 NOTE — Progress Notes (Signed)
Thank you for this update.  Once he is started on Xarelto, from a neurological standpoint, aspirin can be discontinued.

## 2018-08-06 ENCOUNTER — Telehealth: Payer: Self-pay | Admitting: *Deleted

## 2018-08-06 NOTE — Telephone Encounter (Signed)
Lvm fee due for [employer] form.

## 2018-08-12 ENCOUNTER — Other Ambulatory Visit: Payer: Self-pay | Admitting: Adult Health

## 2018-08-12 DIAGNOSIS — Z125 Encounter for screening for malignant neoplasm of prostate: Secondary | ICD-10-CM | POA: Diagnosis not present

## 2018-08-12 DIAGNOSIS — E785 Hyperlipidemia, unspecified: Secondary | ICD-10-CM | POA: Diagnosis not present

## 2018-08-12 DIAGNOSIS — Z23 Encounter for immunization: Secondary | ICD-10-CM | POA: Diagnosis not present

## 2018-08-12 DIAGNOSIS — I69320 Aphasia following cerebral infarction: Secondary | ICD-10-CM | POA: Diagnosis not present

## 2018-08-12 DIAGNOSIS — I10 Essential (primary) hypertension: Secondary | ICD-10-CM | POA: Diagnosis not present

## 2018-08-14 ENCOUNTER — Other Ambulatory Visit: Payer: Self-pay | Admitting: Nurse Practitioner

## 2018-08-14 ENCOUNTER — Telehealth: Payer: Self-pay | Admitting: Nurse Practitioner

## 2018-08-14 MED ORDER — RIVAROXABAN 20 MG PO TABS: 20.0000 mg | ORAL_TABLET | Freq: Every day | ORAL | 11 refills | Status: AC

## 2018-08-14 NOTE — Telephone Encounter (Signed)
-----   Message from Sueanne Margarita, MD sent at 08/14/2018  2:10 PM EDT ----- Please order Xarelto 20mg  daily.

## 2018-08-14 NOTE — Telephone Encounter (Signed)
Xarelto 20 mg Rx sent to patient's pharmacy and aspirin discontinued from patient's list. Called patient and advised that 1 month follow-up visit with CV[facility] is needed and scheduled patient for 7/8 with RPH. I advised him to call back sooner with questions or concerns and he verbalized understanding and agreement.

## 2018-09-10 ENCOUNTER — Telehealth: Payer: Self-pay

## 2018-09-10 NOTE — Telephone Encounter (Signed)
lmom for prescreen  

## 2018-09-11 NOTE — Telephone Encounter (Signed)
Spoke with pt on the phone - he reports no issues with Xarelto as far as adherence, medication cost, s/sx of brusing or bleeding. Confirmed he is taking Xarelto once a day with the largest meal of the day. He lives in [place] and will have his PCP check a BMET and CBC within the next month. Provided him with our fax # so they can forward the results to [place].

## 2018-09-15 ENCOUNTER — Telehealth: Payer: Self-pay | Admitting: Cardiology

## 2018-09-15 NOTE — Telephone Encounter (Signed)
Called and spoke to patient. He states that he was told on 7/2 by PharmD that he needs CBC and BMET drawn with PCP since he recently started Xarelto. Patient states that his PCP will not draw the labs without an order and states that he needs the orders faxed to Dr. Wende Neighbors at [phone]. Made patient aware that we will fax orders. Patient thanked me for the call.

## 2018-09-15 NOTE — Telephone Encounter (Signed)
  Patient is calling because he was supposed to have blood work done and he wanted to do it at his PCP since there is no where near where he lives to do it. I do not see orders for any blood work but can we send him the orders for what is needed so he can have it done at his PCP office.

## 2018-09-16 DIAGNOSIS — I63412 Cerebral infarction due to embolism of left middle cerebral artery: Secondary | ICD-10-CM | POA: Diagnosis not present

## 2018-09-16 DIAGNOSIS — Z7901 Long term (current) use of anticoagulants: Secondary | ICD-10-CM | POA: Diagnosis not present

## 2018-09-17 ENCOUNTER — Ambulatory Visit: Payer: BLUE CROSS/BLUE SHIELD

## 2018-10-28 ENCOUNTER — Telehealth: Payer: Self-pay

## 2018-10-28 NOTE — Telephone Encounter (Signed)
Called and changed appointment tomorrow to virtual visit and reviewed meds. Patient stated understanding that he does not come into the office for appointment and we will call him.

## 2018-10-29 ENCOUNTER — Telehealth (INDEPENDENT_AMBULATORY_CARE_PROVIDER_SITE_OTHER): Payer: BC Managed Care – PPO | Admitting: Cardiology

## 2018-10-29 ENCOUNTER — Encounter: Payer: Self-pay | Admitting: Cardiology

## 2018-10-29 ENCOUNTER — Other Ambulatory Visit: Payer: Self-pay

## 2018-10-29 VITALS — BP 125/81 | HR 58 | Ht 65.0 in | Wt 177.0 lb

## 2018-10-29 DIAGNOSIS — D151 Benign neoplasm of heart: Secondary | ICD-10-CM

## 2018-10-29 DIAGNOSIS — I10 Essential (primary) hypertension: Secondary | ICD-10-CM

## 2018-10-29 DIAGNOSIS — E785 Hyperlipidemia, unspecified: Secondary | ICD-10-CM

## 2018-10-29 DIAGNOSIS — I2510 Atherosclerotic heart disease of native coronary artery without angina pectoris: Secondary | ICD-10-CM | POA: Insufficient documentation

## 2018-10-29 NOTE — Addendum Note (Signed)
Addended by: Shellia Cleverly on: 10/29/2018 10:54 AM   Modules accepted: Orders

## 2018-10-29 NOTE — Patient Instructions (Signed)
Medication Instructions:  The current medical regimen is effective;  continue present plan and medications.  If you need a refill on your cardiac medications before your next appointment, please call your pharmacy.   Lab work: Please have blood work as discussed (CBC, BMP) at your primary care Dr's office. If you have labs (blood work) drawn today and your tests are completely normal, you will receive your results only by: Marland Kitchen MyChart Message (if you have MyChart) OR . A paper copy in the mail If you have any lab test that is abnormal or we need to change your treatment, we will call you to review the results.  Testing/Procedures: Your physician has requested that you have an echocardiogram. Echocardiography is a painless test that uses sound waves to create images of your heart. It provides your doctor with information about the size and shape of your heart and how well your heart's chambers and valves are working. This procedure takes approximately one hour. There are no restrictions for this procedure.  This testing is due in 03/2019 and you will be contacted to be scheduled for it.  Follow-Up: At [facility], you and your health needs are our priority.  As part of our continuing mission to provide you with exceptional heart care, we have created designated Provider Care Teams.  These Care Teams include your primary Cardiologist (physician) and Advanced Practice Providers (APPs -  Physician Assistants and Nurse Practitioners) who all work together to provide you with the care you need, when you need it. You will need a follow up appointment in 6 months with a PA and Dr Radford Pax in 1 year.  Please call our office 2 months in advance to schedule this appointment.  You may see Fransico Him, MD or one of the following Advanced Practice Providers on your designated Care Team:   [place], PA-C Melina Copa, PA-C . Ermalinda Barrios, PA-C Thank you for choosing [facility]!!

## 2018-10-29 NOTE — Progress Notes (Signed)
Virtual Visit via Video Note   This visit type was conducted due to national recommendations for restrictions regarding the COVID-19 Pandemic (e.g. social distancing) in an effort to limit this patient's exposure and mitigate transmission in our community.  Due to his co-morbid illnesses, this patient is at least at moderate risk for complications without adequate follow up.  This format is felt to be most appropriate for this patient at this time.  All issues noted in this document were discussed and addressed.  A limited physical exam was performed with this format.  Please refer to the patient's chart for his consent to telehealth for [facility].   Evaluation Performed:  Follow-up visit  This visit type was conducted due to national recommendations for restrictions regarding the COVID-19 Pandemic (e.g. social distancing).  This format is felt to be most appropriate for this patient at this time.  All issues noted in this document were discussed and addressed.  No physical exam was performed (except for noted visual exam findings with Video Visits).  Please refer to the patient's chart (MyChart message for video visits and phone note for telephone visits) for the patient's consent to telehealth for [facility]  Date:  10/29/2018   ID:  Shawn Mccann, DOB September 27, 1955, MRN [id number]  Patient Location:  Home  Provider location:   [place]  PCP:  Ocie Doyne., MD  Cardiologist:  Fransico Him, MD  Electrophysiologist:  None   Chief Complaint:  Left atrial myxoma and PAF  History of Present Illness:    Shawn Mccann is a 63 y.o. male who presents via audio/video conferencing for a telehealth visit today.    Shawn Mccann is a 63 y.o. male with a history of hypertensionanddyslipidemia and most recent dx in of stroke with work up revealing a left atrial myxoma. Now s/p minimally invasive surgical resection by Dr. Roxy Manns. LHC prior to surgery showed mild nonobstructive CAD. Echo with  normal LVEF. Post operative course was complicated by atrial fibrillation and atrial flutter. He was placed on Eliquis and underwent DCCV on 04/22/18.He was kept on amio 4 weeks  post cardioversion and then it was stopped.    Pt had  f/u with neurology on 5/19 and pt reported that he had self discontinued Eliquis.  He reported to neurology that he discontinued use of Eliquis in March as he felt it made him feel "flighty" and lightheaded and after he stopped it the symptoms resolved. He has otherwise done well from stroke standpoint with only residual deficits of mild to moderate expressive aphasia but does endorse improvement.  He was seen back by Lyda Jester, PA in late May and was doing well.  He was started on Xarelto 20mg  daily.  He is here today for followup and is doing well.  He denies any chest pain or pressure, SOB, DOE, PND, orthopnea, LE edema, dizziness, palpitations or syncope. He is compliant with his meds and is tolerating meds with no SE.    The patient does not have symptoms concerning for COVID-19 infection (fever, chills, cough, or new shortness of breath).    Prior CV studies:   The following studies were reviewed today:  none  Past Medical History:  Diagnosis Date  . Atrial myxoma 04/08/2018  . CVA (cerebral vascular accident) ([place]) 04/08/2018  . Dyslipidemia   . Hypertension   . s/p minimally invasive resection of left atrial myxoma 04/15/2018   Past Surgical History:  Procedure Laterality Date  . CARDIOVERSION N/A 04/22/2018  Procedure: CARDIOVERSION;  Surgeon: Lelon Perla, MD;  Location: [facility] ENDOSCOPY;  Service: Cardiovascular;  Laterality: N/A;  . LEFT HEART CATH AND CORONARY ANGIOGRAPHY N/A 04/11/2018   Procedure: LEFT HEART CATH AND CORONARY ANGIOGRAPHY;  Surgeon: Belva Crome, MD;  Location: [place] CV LAB;  Service: Cardiovascular;  Laterality: N/A;  . MINIMALLY INVASIVE EXCISION OF ATRIAL MYXOMA N/A 04/15/2018   Procedure: MINIMALLY INVASIVE  EXCISION OF LEFT ATRIAL MYXOMA;  Surgeon: Rexene Alberts, MD;  Location: [place];  Service: Open Heart Surgery;  Laterality: N/A;  . NO PAST SURGERIES    . TEE WITHOUT CARDIOVERSION N/A 04/10/2018   Procedure: TRANSESOPHAGEAL ECHOCARDIOGRAM (TEE);  Surgeon: Sanda Klein, MD;  Location: [place];  Service: Cardiovascular;  Laterality: N/A;  . TEE WITHOUT CARDIOVERSION N/A 04/15/2018   Procedure: TRANSESOPHAGEAL ECHOCARDIOGRAM (TEE);  Surgeon: Rexene Alberts, MD;  Location: [place];  Service: Open Heart Surgery;  Laterality: N/A;  . TEE WITHOUT CARDIOVERSION N/A 04/22/2018   Procedure: TRANSESOPHAGEAL ECHOCARDIOGRAM (TEE);  Surgeon: Lelon Perla, MD;  Location: [facility] ENDOSCOPY;  Service: Cardiovascular;  Laterality: N/A;     Current Meds  Medication Sig  . amLODipine (NORVASC) 5 MG tablet Take 1 tablet (5 mg total) by mouth daily.  . metoprolol tartrate (LOPRESSOR) 50 MG tablet Take 1 tablet (50 mg total) by mouth 2 (two) times daily.  . pravastatin (PRAVACHOL) 80 MG tablet Take 80 mg by mouth daily.  . rivaroxaban (XARELTO) 20 MG TABS tablet Take 1 tablet (20 mg total) by mouth daily with supper.     Allergies:   Patient has no allergy information on record.   Social History   Tobacco Use  . Smoking status: Never Smoker  . Smokeless tobacco: Never Used  Substance Use Topics  . Alcohol use: Yes    Comment: occasional  . Drug use: Never     Family Hx: The patient's family history includes CAD (age of onset: 73) in his father; CVA in his maternal grandfather and another family member; Hypertension in his brother and sister; Lung cancer (age of onset: 2) in his mother.  ROS:   Please see the history of present illness.     All other systems reviewed and are negative.   Labs/Other Tests and Data Reviewed:    Recent Labs: 04/09/2018: TSH 1.586 04/11/2018: ALT 19 04/16/2018: Magnesium 2.2 04/18/2018: Hemoglobin 10.9; Platelets 238 04/23/2018: BUN 15; Creatinine, Ser 1.33;  Potassium 4.5; Sodium 137   Recent Lipid Panel Lab Results  Component Value Date/Time   CHOL 168 04/10/2018 05:19 AM   TRIG 72 04/10/2018 05:19 AM   HDL 36 (L) 04/10/2018 05:19 AM   CHOLHDL 4.7 04/10/2018 05:19 AM   LDLCALC 118 (H) 04/10/2018 05:19 AM    Wt Readings from Last 3 Encounters:  10/29/18 177 lb (80.3 kg)  07/31/18 170 lb (77.1 kg)  07/29/18 172 lb (78 kg)     Objective:    Vital Signs:  BP 125/81   Pulse (!) 58   Ht 5\' 5"  (1.651 m)   Wt 177 lb (80.3 kg)   BMI 29.45 kg/m    CONSTITUTIONAL:  Well nourished, well developed male in no acute distress.  EYES: anicteric MOUTH: oral mucosa is pink RESPIRATORY: Normal respiratory effort, symmetric expansion CARDIOVASCULAR: No peripheral edema SKIN: No rash, lesions or ulcers MUSCULOSKELETAL: no digital cyanosis NEURO: Cranial Nerves II-XII grossly intact, moves all extremities PSYCH: Intact judgement and insight.  A&O x 3, Mood/affect appropriate   ASSESSMENT & PLAN:  1.  LA myxoma - resulting in acute CVA.  Now s/p resection via minimally invasive approach 04/2018.  2D echo post op with thickened IAS but no residual mass.  I will repeat 2D echo 2/20201.    2.  Post op afib/flutter - s/p DCCV.  Now off Amio and maintaining NSR. He did not tolerate Eliquis due to vague side effects and now on Xarelto.  His CHADS2VASC score is 4.  Continue on Xarelto 20mg  daily and Lopresor 50mg  BID. I will repeat a BMET and CBC.    3.  Hypertension - BP is controlled.  He will continue on BB and amlodipine 5mg  daily.   4.  Hyperlipidemia - LDL goal is < 70 due to mild CAD.  He will continue on Pravastatin 80mg  daily.  I will get a copy of his FLP fFrom his PCP.    5.  Nonobstructive CAD - no anginal sx.  This was noted on cath 03/2018.  Continue on statin.  No ASA due to DOAC.    COVID-19 Education: The signs and symptoms of COVID-19 were discussed with the patient and how to seek care for testing (follow up with PCP or arrange  E-visit).  The importance of social distancing was discussed today.  Patient Risk:   After full review of this patient's clinical status, I feel that they are at least moderate risk at this time.  Time:   Today, I have spent 20 minutes on telemedicine discussing medical problems including atrial myxoma, PAF, HTN, HLD, CAD.  We also reviewed the symptoms of COVID 19 and the ways to protect against contracting the virus with telehealth technology.  I spent an additional 5 minutes reviewing patient's chart including labs.  Medication Adjustments/Labs and Tests Ordered: Current medicines are reviewed at length with the patient today.  Concerns regarding medicines are outlined above.  Tests Ordered: No orders of the defined types were placed in this encounter.  Medication Changes: No orders of the defined types were placed in this encounter.   Disposition:  Follow up in 6 month(s) with PA and 1 year with me  Signed, Fransico Him, MD  10/29/2018 10:02 AM    [place]

## 2018-11-12 DIAGNOSIS — E785 Hyperlipidemia, unspecified: Secondary | ICD-10-CM | POA: Diagnosis not present

## 2018-11-12 DIAGNOSIS — I10 Essential (primary) hypertension: Secondary | ICD-10-CM | POA: Diagnosis not present

## 2018-11-12 DIAGNOSIS — I69320 Aphasia following cerebral infarction: Secondary | ICD-10-CM | POA: Diagnosis not present

## 2018-11-12 DIAGNOSIS — Z6829 Body mass index (BMI) 29.0-29.9, adult: Secondary | ICD-10-CM | POA: Diagnosis not present

## 2018-12-01 ENCOUNTER — Ambulatory Visit: Payer: BLUE CROSS/BLUE SHIELD | Admitting: Adult Health

## 2018-12-04 ENCOUNTER — Encounter: Payer: Self-pay | Admitting: Adult Health

## 2018-12-04 ENCOUNTER — Other Ambulatory Visit: Payer: Self-pay

## 2018-12-04 ENCOUNTER — Ambulatory Visit: Payer: Medicare HMO | Admitting: Adult Health

## 2018-12-04 VITALS — BP 158/96 | HR 59 | Temp 97.7°F | Ht 65.0 in | Wt 180.0 lb

## 2018-12-04 DIAGNOSIS — E785 Hyperlipidemia, unspecified: Secondary | ICD-10-CM | POA: Diagnosis not present

## 2018-12-04 DIAGNOSIS — R4701 Aphasia: Secondary | ICD-10-CM

## 2018-12-04 DIAGNOSIS — I63412 Cerebral infarction due to embolism of left middle cerebral artery: Secondary | ICD-10-CM

## 2018-12-04 DIAGNOSIS — D151 Benign neoplasm of heart: Secondary | ICD-10-CM | POA: Diagnosis not present

## 2018-12-04 DIAGNOSIS — I10 Essential (primary) hypertension: Secondary | ICD-10-CM | POA: Diagnosis not present

## 2018-12-04 NOTE — Progress Notes (Signed)
I agree with the above plan 

## 2018-12-04 NOTE — Progress Notes (Signed)
[facility] [address] [place]. [place] [address] [phone]       OFFICE FOLLOW UP NOTE  Mr. Drayven Grill Date of Birth:  04-28-1955 Medical Record Number:  [id number]   Reason for visit: f/u embolic stroke [id number] [facility] provider: Dr. Leonie Man   CHIEF COMPLAINT:  Chief Complaint  Patient presents with  . Follow-up    4 mon f/u. Alone. Treatment room. No new concerns at this time.     HPI: Stroke admission 04/09/2018: Mr. Emma Dellaporta is a 63 y.o. male with history of HTN, HLD   who presented to [facility] from urgent care with difficulty speaking and expressive aphasia.   Transferred to [facility] when myxoma found on echo. Stroke work-up revealed multiple bilateral infarcts in desperate vascular territories with MRI showing left frontal small infarcts involving left MCA/PCA, left frontal parietal punctate infarcts in the dominant abnormality remains left frontal opercular cortex without visible hemorrhage.  Vessel imaging unremarkable.  2D echo EF 60 to 65% with 2.2 x 1.3 cm echodense mass likely myxoma.  TEE showed left atrial mass typical for myxoma.  Multiple bilateral infarcts secondary to newly identified myxoma with plan of surgical resection the following week.  Recommended antiplatelet regimen as per cardiology.  HTN stable.  LDL 118 and initiated atorvastatin 40 mg daily.  Other stroke risk factors include EtOH use and family history of stroke but no personal history of stroke.  He underwent surgical procedure on 04/15/2018 with postop course complicated by atrial fibrillation and atrial flutter therefore placed on Eliquis and underwent DCCV on 04/22/2018.  Initial visit 07/29/2018: He has been doing well from a stroke standpoint with residual deficits of mild to moderate expressive aphasia but does endorse improvement.  He denies any cognitive or memory concerns.  He previously participated in PT/OT but denies participating in speech therapy.  He discontinued use of  Eliquis in March as he states this medication made him feel "flighty" and lightheaded.  He states since discontinuing, the symptoms resolved.  He had an appointment scheduled with cardiology in 07/02/2018 but per epic notes, he agreed to rescheduling until 10/2018 as he was stable without mention of discontinuing Eliquis.  He continues on pravastatin without side effects myalgias.  Monitors BP at home which has been stable.  Blood pressure today 150/93.  No further concerns at this time.  Denies new or worsening stroke/TIA symptoms.  Update 12/04/2018: Mr. Herron is being seen today for stroke follow-up.  He has been stable from a stroke standpoint with residual deficits of mild expressive aphasia with improvement.  Due to reported side effects on Eliquis, cardiology initiated Xarelto which she has been tolerating well without bleeding or bruising.  He is on pravastatin without myalgias.  Blood pressure today 158/96.  Denies new or worsening stroke/TIA symptoms.     ROS:   14 system review of systems performed and negative with exception of speech difficulty  PMH:  Past Medical History:  Diagnosis Date  . Atrial myxoma 04/08/2018  . CVA (cerebral vascular accident) ([place]) 04/08/2018  . Dyslipidemia   . Hypertension   . s/p minimally invasive resection of left atrial myxoma 04/15/2018    PSH:  Past Surgical History:  Procedure Laterality Date  . CARDIOVERSION N/A 04/22/2018   Procedure: CARDIOVERSION;  Surgeon: Lelon Perla, MD;  Location: [facility] ENDOSCOPY;  Service: Cardiovascular;  Laterality: N/A;  . LEFT HEART CATH AND CORONARY ANGIOGRAPHY N/A 04/11/2018   Procedure: LEFT HEART CATH AND CORONARY ANGIOGRAPHY;  Surgeon:  Belva Crome, MD;  Location: [place] CV LAB;  Service: Cardiovascular;  Laterality: N/A;  . MINIMALLY INVASIVE EXCISION OF ATRIAL MYXOMA N/A 04/15/2018   Procedure: MINIMALLY INVASIVE EXCISION OF LEFT ATRIAL MYXOMA;  Surgeon: Rexene Alberts, MD;  Location: [place];  Service:  Open Heart Surgery;  Laterality: N/A;  . NO PAST SURGERIES    . TEE WITHOUT CARDIOVERSION N/A 04/10/2018   Procedure: TRANSESOPHAGEAL ECHOCARDIOGRAM (TEE);  Surgeon: Sanda Klein, MD;  Location: [place];  Service: Cardiovascular;  Laterality: N/A;  . TEE WITHOUT CARDIOVERSION N/A 04/15/2018   Procedure: TRANSESOPHAGEAL ECHOCARDIOGRAM (TEE);  Surgeon: Rexene Alberts, MD;  Location: [place];  Service: Open Heart Surgery;  Laterality: N/A;  . TEE WITHOUT CARDIOVERSION N/A 04/22/2018   Procedure: TRANSESOPHAGEAL ECHOCARDIOGRAM (TEE);  Surgeon: Lelon Perla, MD;  Location: [facility] ENDOSCOPY;  Service: Cardiovascular;  Laterality: N/A;    Social History:  Social History   Socioeconomic History  . Marital status: Single    Spouse name: Not on file  . Number of children: Not on file  . Years of education: Not on file  . Highest education level: Not on file  Occupational History  . Occupation: Games developer  Social Needs  . Financial resource strain: Not on file  . Food insecurity    Worry: Not on file    Inability: Not on file  . Transportation needs    Medical: Not on file    Non-medical: Not on file  Tobacco Use  . Smoking status: Never Smoker  . Smokeless tobacco: Never Used  Substance and Sexual Activity  . Alcohol use: Yes    Comment: occasional  . Drug use: Never  . Sexual activity: Not on file  Lifestyle  . Physical activity    Days per week: Not on file    Minutes per session: Not on file  . Stress: Not on file  Relationships  . Social Herbalist on phone: Not on file    Gets together: Not on file    Attends religious service: Not on file    Active member of club or organization: Not on file    Attends meetings of clubs or organizations: Not on file    Relationship status: Not on file  . Intimate partner violence    Fear of current or ex partner: Not on file    Emotionally abused: Not on file    Physically abused: Not on file    Forced sexual  activity: Not on file  Other Topics Concern  . Not on file  Social History Narrative  . Not on file    Family History:  Family History  Problem Relation Age of Onset  . Lung cancer Mother 42  . CAD Father 37  . Hypertension Sister   . Hypertension Brother   . CVA Maternal Grandfather   . CVA Other     Medications:   Current Outpatient Medications on File Prior to Visit  Medication Sig Dispense Refill  . amiodarone (PACERONE) 200 MG tablet Take 200 mg by mouth daily.    Marland Kitchen amLODipine-benazepril (LOTREL) 10-20 MG capsule Take 1 capsule by mouth 2 (two) times daily.    . metoprolol tartrate (LOPRESSOR) 50 MG tablet Take 1 tablet (50 mg total) by mouth 2 (two) times daily. 60 tablet 3  . pravastatin (PRAVACHOL) 80 MG tablet Take 80 mg by mouth daily.    . rivaroxaban (XARELTO) 20 MG TABS tablet Take 1 tablet (20 mg  total) by mouth daily with supper. 30 tablet 11   No current facility-administered medications on file prior to visit.     Allergies:  Not on File   Physical Exam  Vitals:   12/04/18 0939  BP: (!) 158/96  Pulse: (!) 59  Temp: 97.7 F (36.5 C)  TempSrc: Oral  Weight: 180 lb (81.6 kg)  Height: 5\' 5"  (1.651 m)   Body mass index is 29.95 kg/m. No exam data present  General: well developed, well nourished,  pleasant middle-aged Caucasian male, seated, in no evident distress Head: head normocephalic and atraumatic.   Neck: supple with no carotid or supraclavicular bruits Cardiovascular: regular rate and rhythm, no murmurs Musculoskeletal: no deformity Skin:  no rash/petichiae Vascular:  Normal pulses all extremities   Neurologic Exam Mental Status: Awake and fully alert.   Mild expressive aphasia.  Oriented to place and time. Recent and remote memory intact. Attention span, concentration and fund of knowledge appropriate. Mood and affect appropriate.  Cranial Nerves: Pupils equal, briskly reactive to light. Extraocular movements full without nystagmus.  Visual fields full to confrontation. Hearing intact. Facial sensation intact. Face, tongue, palate moves normally and symmetrically.  Motor: Normal bulk and tone. Normal strength in all tested extremity muscles. Sensory.: intact to touch , pinprick , position and vibratory sensation.  Coordination: Rapid alternating movements normal in all extremities. Finger-to-nose and heel-to-shin performed accurately bilaterally. Gait and Station: Arises from chair without difficulty. Stance is normal. Gait demonstrates  normal stride length and balance. Reflexes: 1+ and symmetric. Toes downgoing.      ASSESSMENT: Shawn Mccann is a 63 y.o. 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  Underwent surgical resection 0000000 with complication post procedure of atrial fibrillation and atrial flutter with initiation of Eliquis and DCCV. Unable to tolerate Eliquis therefore transitioned to Xarelto. Vascular risk factors include HTN and HLD.  Residual deficits of expressive aphasia with improvement since prior visit.      PLAN:  1. Multiple embolic infarcts: Continue Xarelto (rivaroxaban) daily  and continue pravastatin for secondary stroke prevention. maintain strict control of hypertension with blood pressure goal below 130/90, diabetes with hemoglobin A1c goal below 6.5% and cholesterol with LDL cholesterol (bad cholesterol) goal below 70 mg/dL.  I also advised the patient to eat a healthy diet with plenty of whole grains, cereals, fruits and vegetables, exercise regularly with at least 30 minutes of continuous activity daily and maintain ideal body weight. 2. Atrial myxoma/post op afib: continue xarelto and ongoing follow up with cardiology for monitoring and management 3. HTN: Advised to continue current treatment regimen.  Advised to continue to monitor at home along with continued follow-up with PCP for management 4. HLD: Advised to continue  current treatment regimen along with continued follow-up with PCP for future prescribing and monitoring of lipid panel 5. Expressive aphasia: stable with improvement   Stable from stroke standpoint and recommend follow up as needed   Greater than 50% of time during this 25 minute visit was spent on counseling, explanation of diagnosis of multiple embolic infarcts, reviewing risk factor management of atrial fibrillation, HTN and HLD, planning of further management along with potential future management, and discussion with patient and family answering all questions.    Frann Rider, AGNP-BC  [facility] [address] [place], [place] [address]  Phone [phone] Fax [phone] Note: This document was prepared with digital dictation and possible smart phrase technology. Any transcriptional errors  that result from this process are unintentional.

## 2018-12-04 NOTE — Patient Instructions (Signed)
Continue Xarelto (rivaroxaban) daily  and pravastatin for secondary stroke prevention  Continue to follow up with PCP regarding blood pressure and cholesterol management   Continue to follow with cardiology regularly for ongoing monitoring and management  Continue to stay active and maintain a healthy diet  Continue to monitor blood pressure at home  Maintain strict control of hypertension with blood pressure goal below 130/90, diabetes with hemoglobin A1c goal below 6.5% and cholesterol with LDL cholesterol (bad cholesterol) goal below 70 mg/dL. I also advised the patient to eat a healthy diet with plenty of whole grains, cereals, fruits and vegetables, exercise regularly and maintain ideal body weight.         Thank you for coming to see [place] at [facility]. I hope we have been able to provide you high quality care today.  You may receive a patient satisfaction survey over the next few weeks. We would appreciate your feedback and comments so that we may continue to improve ourselves and the health of our patients.

## 2019-02-24 DIAGNOSIS — Z6830 Body mass index (BMI) 30.0-30.9, adult: Secondary | ICD-10-CM | POA: Diagnosis not present

## 2019-02-24 DIAGNOSIS — I69320 Aphasia following cerebral infarction: Secondary | ICD-10-CM | POA: Diagnosis not present

## 2019-02-24 DIAGNOSIS — I10 Essential (primary) hypertension: Secondary | ICD-10-CM | POA: Diagnosis not present

## 2019-02-24 DIAGNOSIS — Z23 Encounter for immunization: Secondary | ICD-10-CM | POA: Diagnosis not present

## 2019-02-24 DIAGNOSIS — E559 Vitamin D deficiency, unspecified: Secondary | ICD-10-CM | POA: Diagnosis not present

## 2019-02-24 DIAGNOSIS — E785 Hyperlipidemia, unspecified: Secondary | ICD-10-CM | POA: Diagnosis not present

## 2019-04-03 ENCOUNTER — Encounter (HOSPITAL_COMMUNITY): Payer: Self-pay | Admitting: Radiology

## 2019-04-15 ENCOUNTER — Telehealth (HOSPITAL_COMMUNITY): Payer: Self-pay | Admitting: Radiology

## 2019-04-15 NOTE — Telephone Encounter (Signed)
Just an FYI. We have made several attempts to contact this patient including sending a letter to schedule or reschedule their echocardiogram. We will be removing the patient from the echo WQ. 1.22.21@1523  will mail letter evd  1.19.21 @1453  Lmom evd  1.5.21@1201  Lmom evd  Thank you

## 2019-05-08 ENCOUNTER — Other Ambulatory Visit: Payer: Self-pay | Admitting: Thoracic Surgery (Cardiothoracic Vascular Surgery)

## 2019-05-08 DIAGNOSIS — Z86018 Personal history of other benign neoplasm: Secondary | ICD-10-CM

## 2019-05-11 ENCOUNTER — Other Ambulatory Visit: Payer: Self-pay

## 2019-05-11 ENCOUNTER — Encounter: Payer: Self-pay | Admitting: Thoracic Surgery (Cardiothoracic Vascular Surgery)

## 2019-05-11 ENCOUNTER — Ambulatory Visit
Admission: RE | Admit: 2019-05-11 | Discharge: 2019-05-11 | Disposition: A | Discharge status: Home / Self Care | Payer: Medicare HMO | Source: Ambulatory Visit | Attending: Thoracic Surgery (Cardiothoracic Vascular Surgery) | Admitting: Thoracic Surgery (Cardiothoracic Vascular Surgery)

## 2019-05-11 ENCOUNTER — Ambulatory Visit: Payer: Medicare HMO | Admitting: Thoracic Surgery (Cardiothoracic Vascular Surgery)

## 2019-05-11 VITALS — BP 145/95 | HR 100 | Temp 97.7°F | Resp 20 | Ht 65.0 in | Wt 188.0 lb

## 2019-05-11 DIAGNOSIS — Z86018 Personal history of other benign neoplasm: Secondary | ICD-10-CM

## 2019-05-11 DIAGNOSIS — D219 Benign neoplasm of connective and other soft tissue, unspecified: Secondary | ICD-10-CM

## 2019-05-11 NOTE — Patient Instructions (Signed)
Continue all previous medications without any changes at this time  

## 2019-05-11 NOTE — Progress Notes (Signed)
[address]       [place] [address]             [phone]     CARDIOTHORACIC SURGERY OFFICE NOTE   Primary Cardiologist is Fransico Him, MD PCP is Ocie Doyne., MD (Inactive)   HPI:  Patient is a 64 year old male with hypertension and hyperlipidemia who originally presented more than 1 year ago with acute embolic stroke.  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  His early postoperative recovery was notable for postoperative atrial fibrillation for which he was treated with amiodarone and ultimately discharged from the hospital on oral Eliquis for anticoagulation.  He was last seen here in our office on May 05, 2018 at which time he was recovering well.  He subsequently missed several scheduled follow-up appointments because of the COVID-19 pandemic.  He has been followed intermittently by Dr. Radford Pax who had a telemedicine visit with the patient last August.  By that time the patient was off amiodarone and had stopped oral anticoagulation on his own.  He was restarted on amiodarone and Xarelto at that time but he has not had any follow-up since and he has not had an EKG nor echocardiogram performed.  He returns her office today and reports that he feels exceptionally well.  He no longer has any pain in his chest related to his surgery.  He denies any symptoms of exertional shortness of breath.  He has not had any palpitations or other symptoms to suggest a recurrence of atrial fibrillation or atrial flutter.   Current Outpatient Medications  Medication Sig Dispense Refill  . amiodarone (PACERONE) 200 MG tablet Take 200 mg by mouth daily.    Marland Kitchen amLODipine-benazepril (LOTREL) 10-20 MG capsule Take 1 capsule by mouth 2 (two) times daily.    . metoprolol tartrate (LOPRESSOR) 50 MG tablet Take 1 tablet (50 mg total) by mouth 2 (two) times daily. 60 tablet 3  . pravastatin (PRAVACHOL) 80 MG  tablet Take 80 mg by mouth daily.    . rivaroxaban (XARELTO) 20 MG TABS tablet Take 1 tablet (20 mg total) by mouth daily with supper. 30 tablet 11   No current facility-administered medications for this visit.      Physical Exam:   BP (!) 145/95   Pulse 100   Temp 97.7 F (36.5 C) (Skin)   Resp 20   Ht 5\' 5"  (1.651 m)   Wt 188 lb (85.3 kg)   SpO2 96% Comment: RA  BMI 31.28 kg/m   General:  Well-appearing  Chest:   Clear to auscultation with symmetric breath sounds  CV:   Somewhat tachycardic but regular rhythm, no murmur appreciated  Incisions:  Completely healed  Abdomen:  Soft nontender  Extremities:  Warm and well-perfused  Diagnostic Tests:  n/a   Impression:  Patient is doing well clinically approximately 1 year status post minimally invasive resection of left atrial myxoma.  He did have postoperative atrial fibrillation and atrial flutter immediately following his surgery, but he has been lost to follow-up since that time.  Plan:  The patient should undergo routine twelve-lead EKG and transthoracic echocardiogram.  It is possible that both amiodarone and Xarelto could be discontinued if the patient remains in sinus rhythm.  We will leave any subsequent changes to his medications to Dr. Radford Pax and colleagues at [facility].  Patient will call and return to our  office in the future only should specific problems or questions arise.   I spent in excess of 15 minutes during the conduct of this office consultation and >50% of this time involved direct face-to-face encounter with the patient for counseling and/or coordination of their care.    Valentina Gu. Roxy Manns, MD 05/11/2019 11:20 AM

## 2019-05-13 ENCOUNTER — Telehealth: Payer: Self-pay

## 2019-05-13 DIAGNOSIS — I480 Paroxysmal atrial fibrillation: Secondary | ICD-10-CM

## 2019-05-13 NOTE — Telephone Encounter (Signed)
-----   Message from Sueanne Margarita, MD sent at 05/11/2019  7:29 PM EST ----- Please order 2D echo and EKG for afib.  Please get a 30 day event monitor and followup with me in 6 weeks  Traci ----- Message ----- From: Rexene Alberts, MD Sent: 05/11/2019   6:42 PM EST To: Sueanne Margarita, MD

## 2019-05-25 DIAGNOSIS — I10 Essential (primary) hypertension: Secondary | ICD-10-CM | POA: Diagnosis not present

## 2019-05-25 DIAGNOSIS — I69320 Aphasia following cerebral infarction: Secondary | ICD-10-CM | POA: Diagnosis not present

## 2019-05-25 DIAGNOSIS — Z1331 Encounter for screening for depression: Secondary | ICD-10-CM | POA: Diagnosis not present

## 2019-05-25 DIAGNOSIS — Z6831 Body mass index (BMI) 31.0-31.9, adult: Secondary | ICD-10-CM | POA: Diagnosis not present

## 2019-05-25 DIAGNOSIS — E559 Vitamin D deficiency, unspecified: Secondary | ICD-10-CM | POA: Diagnosis not present

## 2019-05-25 DIAGNOSIS — Z9181 History of falling: Secondary | ICD-10-CM | POA: Diagnosis not present

## 2019-05-25 DIAGNOSIS — E785 Hyperlipidemia, unspecified: Secondary | ICD-10-CM | POA: Diagnosis not present

## 2019-05-28 ENCOUNTER — Ambulatory Visit (HOSPITAL_COMMUNITY): Payer: Medicare HMO | Attending: Cardiovascular Disease

## 2019-05-28 ENCOUNTER — Other Ambulatory Visit: Payer: Self-pay

## 2019-05-28 DIAGNOSIS — I480 Paroxysmal atrial fibrillation: Secondary | ICD-10-CM | POA: Diagnosis not present

## 2019-06-09 ENCOUNTER — Encounter: Payer: Self-pay | Admitting: Cardiology

## 2019-06-09 ENCOUNTER — Other Ambulatory Visit: Payer: Self-pay

## 2019-06-09 ENCOUNTER — Ambulatory Visit: Payer: Medicare HMO | Admitting: Cardiology

## 2019-06-09 VITALS — BP 106/58 | HR 49 | Ht 65.0 in | Wt 185.2 lb

## 2019-06-09 DIAGNOSIS — I10 Essential (primary) hypertension: Secondary | ICD-10-CM | POA: Diagnosis not present

## 2019-06-09 DIAGNOSIS — E785 Hyperlipidemia, unspecified: Secondary | ICD-10-CM

## 2019-06-09 DIAGNOSIS — I480 Paroxysmal atrial fibrillation: Secondary | ICD-10-CM

## 2019-06-09 DIAGNOSIS — I2510 Atherosclerotic heart disease of native coronary artery without angina pectoris: Secondary | ICD-10-CM | POA: Diagnosis not present

## 2019-06-09 DIAGNOSIS — D151 Benign neoplasm of heart: Secondary | ICD-10-CM

## 2019-06-09 LAB — ALT: ALT: 13 IU/L (ref 0–44)

## 2019-06-09 LAB — LIPID PANEL
Chol/HDL Ratio: 4.4 ratio (ref 0.0–5.0)
Cholesterol, Total: 209 mg/dL — ABNORMAL HIGH (ref 100–199)
HDL: 47 mg/dL (ref >39)
LDL Chol Calc (NIH): 147 mg/dL — ABNORMAL HIGH (ref 0–99)
Triglycerides: 85 mg/dL (ref 0–149)
VLDL Cholesterol Cal: 15 mg/dL (ref 5–40)

## 2019-06-09 MED ORDER — METOPROLOL TARTRATE 25 MG PO TABS: 25.0000 mg | ORAL_TABLET | Freq: Two times a day (BID) | ORAL | 3 refills | Status: AC

## 2019-06-09 MED ORDER — METOPROLOL TARTRATE 25 MG PO TABS: 25.0000 mg | ORAL_TABLET | Freq: Two times a day (BID) | ORAL | 3 refills | Status: DC

## 2019-06-09 NOTE — Progress Notes (Signed)
Cardiology Office Note:    Date:  06/09/2019   ID:  Shawn Mccann, DOB [birth year], MRN [id number]  PCP:  Shawn Mccann., MD (Inactive)  Cardiologist:  Shawn Him, MD    Referring MD: No ref. provider found   Chief Complaint  Patient presents with  . Follow-up    LA myxoma, HLD, PAF    History of Present Illness:    Shawn Mccann is a 64 y.o. male with a hx of hypertensionanddyslipidemia and most recent dx in of stroke with work up revealing a left atrial myxoma. Now s/p minimally invasive surgical resection by Dr. Roxy Manns. LHC prior to surgery showed mild nonobstructive CAD. Echo with normal LVEF. Post operative course was complicated by atrial fibrillation and atrial flutter. He was placed on Eliquis and underwent DCCV on 04/22/18.He was kept on amio 4 weeks  post cardioversion and then it was stopped.    Pt had  f/u with neurologyon 5/19and pt reported that he had self discontinued Eliquis. He reported to neurology that he discontinued use of Eliquis in March as he felt it made Mccann feel "flighty" and lightheaded and after he stopped it the symptoms resolved.He has otherwise done well fromstroke standpoint withonlyresidual deficits of mild to moderate expressive aphasia but does endorse improvement. He was seen back by Lyda Jester, PA in late May 2019 and was doing well.  He was started on Xarelto 20mg  daily.   Past Medical History:  Diagnosis Date  . Atrial myxoma 04/08/2018  . CVA (cerebral vascular accident) ([place]) 04/08/2018  . Dyslipidemia   . Hypertension   . s/p minimally invasive resection of left atrial myxoma 04/15/2018    Past Surgical History:  Procedure Laterality Date  . CARDIOVERSION N/A 04/22/2018   Procedure: CARDIOVERSION;  Surgeon: Lelon Perla, MD;  Location: [facility] ENDOSCOPY;  Service: Cardiovascular;  Laterality: N/A;  . LEFT HEART CATH AND CORONARY ANGIOGRAPHY N/A 04/11/2018   Procedure: LEFT HEART CATH AND CORONARY ANGIOGRAPHY;  Surgeon: Belva Crome, MD;  Location: [place] CV LAB;  Service: Cardiovascular;  Laterality: N/A;  . MINIMALLY INVASIVE EXCISION OF ATRIAL MYXOMA N/A 04/15/2018   Procedure: MINIMALLY INVASIVE EXCISION OF LEFT ATRIAL MYXOMA;  Surgeon: Rexene Alberts, MD;  Location: [place];  Service: Open Heart Surgery;  Laterality: N/A;  . NO PAST SURGERIES    . TEE WITHOUT CARDIOVERSION N/A 04/10/2018   Procedure: TRANSESOPHAGEAL ECHOCARDIOGRAM (TEE);  Surgeon: Sanda Klein, MD;  Location: [place];  Service: Cardiovascular;  Laterality: N/A;  . TEE WITHOUT CARDIOVERSION N/A 04/15/2018   Procedure: TRANSESOPHAGEAL ECHOCARDIOGRAM (TEE);  Surgeon: Rexene Alberts, MD;  Location: [place];  Service: Open Heart Surgery;  Laterality: N/A;  . TEE WITHOUT CARDIOVERSION N/A 04/22/2018   Procedure: TRANSESOPHAGEAL ECHOCARDIOGRAM (TEE);  Surgeon: Lelon Perla, MD;  Location: [facility] ENDOSCOPY;  Service: Cardiovascular;  Laterality: N/A;    Current Medications: Current Meds  Medication Sig  . amiodarone (PACERONE) 200 MG tablet Take 200 mg by mouth daily.  Marland Kitchen amLODipine-benazepril (LOTREL) 10-20 MG capsule Take 1 capsule by mouth 2 (two) times daily.  . metoprolol tartrate (LOPRESSOR) 50 MG tablet Take 1 tablet (50 mg total) by mouth 2 (two) times daily.  . pravastatin (PRAVACHOL) 80 MG tablet Take 80 mg by mouth daily.  . rivaroxaban (XARELTO) 20 MG TABS tablet Take 1 tablet (20 mg total) by mouth daily with supper.     Allergies:   Patient has no known allergies.   Social History   Socioeconomic History  .  Marital status: Single    Spouse name: Not on file  . Number of children: Not on file  . Years of education: Not on file  . Highest education level: Not on file  Occupational History  . Occupation: Games developer  Tobacco Use  . Smoking status: Never Smoker  . Smokeless tobacco: Never Used  Substance and Sexual Activity  . Alcohol use: Yes    Comment: occasional  . Drug use: Never  . Sexual activity: Not on  file  Other Topics Concern  . Not on file  Social History Narrative  . Not on file   Social Determinants of Health   Financial Resource Strain:   . Difficulty of Paying Living Expenses:   Food Insecurity:   . Worried About Charity fundraiser in the Last Year:   . Arboriculturist in the Last Year:   Transportation Needs:   . Film/video editor (Medical):   Marland Kitchen Lack of Transportation (Non-Medical):   Physical Activity:   . Days of Exercise per Week:   . Minutes of Exercise per Session:   Stress:   . Feeling of Stress :   Social Connections:   . Frequency of Communication with Friends and Family:   . Frequency of Social Gatherings with Friends and Family:   . Attends Religious Services:   . Active Member of Clubs or Organizations:   . Attends Archivist Meetings:   Marland Kitchen Marital Status:      Family History: The patient's family history includes CAD (age of onset: 7) in his father; CVA in his maternal grandfather and another family member; Hypertension in his brother and sister; Lung cancer (age of onset: 4) in his mother.  ROS:   Please see the history of present illness.    ROS  All other systems reviewed and negative.   EKGs/Labs/Other Studies Reviewed:    The following studies were reviewed today: none  EKG:  EKG is  ordered today.  The ekg ordered today demonstrates sinus bradycardia at 49bpm with nonspecific T wave abnormality  Recent Labs: No results found for requested labs within last 8760 hours.   Recent Lipid Panel    Component Value Date/Time   CHOL 168 04/10/2018 0519   TRIG 72 04/10/2018 0519   HDL 36 (L) 04/10/2018 0519   CHOLHDL 4.7 04/10/2018 0519   VLDL 14 04/10/2018 0519   LDLCALC 118 (H) 04/10/2018 0519    Physical Exam:    VS:  BP (!) 106/58   Pulse (!) 49   Ht 5\' 5"  (1.651 m)   Wt 185 lb 3.2 oz (84 kg)   BMI 30.82 kg/m     Wt Readings from Last 3 Encounters:  06/09/19 185 lb 3.2 oz (84 kg)  05/11/19 188 lb (85.3 kg)    12/04/18 180 lb (81.6 kg)     GEN:  Well nourished, well developed in no acute distress HEENT: Normal NECK: No JVD; No carotid bruits LYMPHATICS: No lymphadenopathy CARDIAC: RRR, no murmurs, rubs, gallops RESPIRATORY:  Clear to auscultation without rales, wheezing or rhonchi  ABDOMEN: Soft, non-tender, non-distended MUSCULOSKELETAL:  No edema; No deformity  SKIN: Warm and dry NEUROLOGIC:  Alert and oriented x 3 PSYCHIATRIC:  Normal affect   ASSESSMENT:    1. Atrial myxoma   2. PAF (paroxysmal atrial fibrillation) ([place])   3. Essential hypertension   4. Dyslipidemia   5. Coronary artery disease involving native coronary artery of native heart without angina pectoris  PLAN:    In order of problems listed above:  1.  LA myxoma  - resulting in acute CVA.  -s/p resection via minimally invasive approach 04/2018.   -2D echo post op with thickened IAS but no residual mass -repeat echo this month showed normal LVF with G2DD and no evidence of residual myxoma or IAS shunt  2. PAF - s/p DCCV.  -he remains in NSR -he had Amio still listed on his med rec but he has not taken Amio since last Spring - I will make the correction on his med list -He did not tolerate Eliquis due to vague side effects and now on Xarelto.  His CHADS2VASC score is 4.   -denies any bleeding problems on DOAC -continue Xarelto 20mg  daily and decrease Lopressor to 25mg  BID due to significant bradycardia -Creatinine was 1.37 and K+ 4.8 and Hbg 15.8 on 05/25/2019 -CrCl is 55ml/min  3.  Hypertension -BP controlled on exam -continue  Lotrel 10-20mg  BID -decreasing Lopressor to 25mg  BID due to bradycardia  4.  Hyperlipidemia  -LDL goal is < 70 due to mild CAD.  -repeat FLP -continue Pravastatin 80mg  dailyHe will continue on Pravastatin 80mg  daily.   5.  Nonobstructive CAD  -denies any anginal sx -continue on BB and statin -no ASA due to DOAC  Medication Adjustments/Labs and Tests Ordered: Current  medicines are reviewed at length with the patient today.  Concerns regarding medicines are outlined above.  Orders Placed This Encounter  Procedures  . EKG 12-Lead   No orders of the defined types were placed in this encounter.   Signed, Shawn Him, MD  06/09/2019 9:31 AM    [place]

## 2019-06-09 NOTE — Patient Instructions (Addendum)
Medication Instructions:  Your physician has recommended you make the following change in your medication: 1- Decrease Metoprolol 25 mg by mouth twice daily  *If you need a refill on your cardiac medications before your next appointment, please call your pharmacy*  Lab Work: Your physician recommends that you have lab work today- ALT and fasting lipid panel  If you have labs (blood work) drawn today and your tests are completely normal, you will receive your results only by: Marland Kitchen MyChart Message (if you have MyChart) OR . A paper copy in the mail If you have any lab test that is abnormal or we need to change your treatment, we will call you to review the results.  Testing/Procedures: None ordered today.  Follow-Up: At [facility], you and your health needs are our priority.  As part of our continuing mission to provide you with exceptional heart care, we have created designated Provider Care Teams.  These Care Teams include your primary Cardiologist (physician) and Advanced Practice Providers (APPs -  Physician Assistants and Nurse Practitioners) who all work together to provide you with the care you need, when you need it.  We recommend signing up for the patient portal called "MyChart".  Sign up information is provided on this After Visit Summary.  MyChart is used to connect with patients for Virtual Visits (Telemedicine).  Patients are able to view lab/test results, encounter notes, upcoming appointments, etc.  Non-urgent messages can be sent to your provider as well.   To learn more about what you can do with MyChart, go to [email or web address].    Your next appointment:   6 month(s)  The format for your next appointment:   Either In Person or Virtual  Provider:   You may see Fransico Him, MD or one of the following Advanced Practice Providers on your designated Care Team:    Melina Copa, PA-C  Ermalinda Barrios, PA-C

## 2019-06-10 ENCOUNTER — Telehealth: Payer: Self-pay | Admitting: Cardiology

## 2019-06-10 NOTE — Telephone Encounter (Signed)
Ghazi called returning Caryle's call in regards to his lab results. I reached out to [facility] via secure chat and then transferred the call over to her.

## 2019-06-11 ENCOUNTER — Telehealth: Payer: Self-pay

## 2019-06-11 DIAGNOSIS — E785 Hyperlipidemia, unspecified: Secondary | ICD-10-CM

## 2019-06-11 MED ORDER — EZETIMIBE 10 MG PO TABS: 10.0000 mg | ORAL_TABLET | Freq: Every day | ORAL | 3 refills | Status: AC

## 2019-06-11 MED ORDER — ROSUVASTATIN CALCIUM 40 MG PO TABS: 40.0000 mg | ORAL_TABLET | Freq: Every day | ORAL | 3 refills | Status: AC

## 2019-06-11 NOTE — Addendum Note (Signed)
Addended by: Antonieta Iba on: 06/11/2019 12:12 PM   Modules accepted: Orders

## 2019-06-11 NOTE — Telephone Encounter (Signed)
-----   Message from Leeroy Bock, [facility] sent at 06/11/2019  9:43 AM EDT ----- Recommend stopping pravastatin, starting rosuvastatin 40mg  daily and adding ezetimibe 10mg  daily. Recheck lipids and LFTs in 2 months. If LDL remains elevated > 70 or if pt reports intolerances to either of these medications, please have pt scheduled in lipid clinic to discuss adding PCSK9i injectable medication like Repatha or Praluent.

## 2019-06-25 ENCOUNTER — Ambulatory Visit: Payer: Medicare HMO | Admitting: Cardiology

## 2019-08-11 ENCOUNTER — Other Ambulatory Visit: Payer: Medicare HMO

## 2019-08-25 DIAGNOSIS — Z6831 Body mass index (BMI) 31.0-31.9, adult: Secondary | ICD-10-CM | POA: Diagnosis not present

## 2019-08-25 DIAGNOSIS — I10 Essential (primary) hypertension: Secondary | ICD-10-CM | POA: Diagnosis not present

## 2019-08-25 DIAGNOSIS — E559 Vitamin D deficiency, unspecified: Secondary | ICD-10-CM | POA: Diagnosis not present

## 2019-08-25 DIAGNOSIS — E785 Hyperlipidemia, unspecified: Secondary | ICD-10-CM | POA: Diagnosis not present

## 2019-08-25 DIAGNOSIS — Z139 Encounter for screening, unspecified: Secondary | ICD-10-CM | POA: Diagnosis not present

## 2019-08-25 DIAGNOSIS — Z125 Encounter for screening for malignant neoplasm of prostate: Secondary | ICD-10-CM | POA: Diagnosis not present

## 2019-08-25 DIAGNOSIS — H1010 Acute atopic conjunctivitis, unspecified eye: Secondary | ICD-10-CM | POA: Diagnosis not present

## 2019-11-25 DIAGNOSIS — Z23 Encounter for immunization: Secondary | ICD-10-CM | POA: Diagnosis not present

## 2019-11-25 DIAGNOSIS — I69320 Aphasia following cerebral infarction: Secondary | ICD-10-CM | POA: Diagnosis not present

## 2019-11-25 DIAGNOSIS — I10 Essential (primary) hypertension: Secondary | ICD-10-CM | POA: Diagnosis not present

## 2019-11-25 DIAGNOSIS — Z6831 Body mass index (BMI) 31.0-31.9, adult: Secondary | ICD-10-CM | POA: Diagnosis not present

## 2019-11-25 DIAGNOSIS — E559 Vitamin D deficiency, unspecified: Secondary | ICD-10-CM | POA: Diagnosis not present

## 2019-11-25 DIAGNOSIS — E785 Hyperlipidemia, unspecified: Secondary | ICD-10-CM | POA: Diagnosis not present

## 2020-05-27 DIAGNOSIS — E785 Hyperlipidemia, unspecified: Secondary | ICD-10-CM | POA: Diagnosis not present

## 2020-05-27 DIAGNOSIS — E0590 Thyrotoxicosis, unspecified without thyrotoxic crisis or storm: Secondary | ICD-10-CM | POA: Diagnosis not present

## 2020-05-27 DIAGNOSIS — E559 Vitamin D deficiency, unspecified: Secondary | ICD-10-CM | POA: Diagnosis not present

## 2020-05-27 DIAGNOSIS — I69320 Aphasia following cerebral infarction: Secondary | ICD-10-CM | POA: Diagnosis not present

## 2020-05-27 DIAGNOSIS — I4891 Unspecified atrial fibrillation: Secondary | ICD-10-CM | POA: Diagnosis not present

## 2020-05-27 DIAGNOSIS — Z6830 Body mass index (BMI) 30.0-30.9, adult: Secondary | ICD-10-CM | POA: Diagnosis not present

## 2020-05-27 DIAGNOSIS — I10 Essential (primary) hypertension: Secondary | ICD-10-CM | POA: Diagnosis not present

## 2020-05-27 DIAGNOSIS — R739 Hyperglycemia, unspecified: Secondary | ICD-10-CM | POA: Diagnosis not present

## 2020-08-26 DIAGNOSIS — Z1331 Encounter for screening for depression: Secondary | ICD-10-CM | POA: Diagnosis not present

## 2020-08-26 DIAGNOSIS — I69320 Aphasia following cerebral infarction: Secondary | ICD-10-CM | POA: Diagnosis not present

## 2020-08-26 DIAGNOSIS — E0590 Thyrotoxicosis, unspecified without thyrotoxic crisis or storm: Secondary | ICD-10-CM | POA: Diagnosis not present

## 2020-08-26 DIAGNOSIS — Z9181 History of falling: Secondary | ICD-10-CM | POA: Diagnosis not present

## 2020-08-26 DIAGNOSIS — R739 Hyperglycemia, unspecified: Secondary | ICD-10-CM | POA: Diagnosis not present

## 2020-08-26 DIAGNOSIS — I10 Essential (primary) hypertension: Secondary | ICD-10-CM | POA: Diagnosis not present

## 2020-08-26 DIAGNOSIS — Z6830 Body mass index (BMI) 30.0-30.9, adult: Secondary | ICD-10-CM | POA: Diagnosis not present

## 2020-08-26 DIAGNOSIS — E559 Vitamin D deficiency, unspecified: Secondary | ICD-10-CM | POA: Diagnosis not present

## 2020-08-26 DIAGNOSIS — E785 Hyperlipidemia, unspecified: Secondary | ICD-10-CM | POA: Diagnosis not present

## 2020-08-26 DIAGNOSIS — Z139 Encounter for screening, unspecified: Secondary | ICD-10-CM | POA: Diagnosis not present

## 2020-08-26 DIAGNOSIS — Z125 Encounter for screening for malignant neoplasm of prostate: Secondary | ICD-10-CM | POA: Diagnosis not present

## 2020-09-20 DIAGNOSIS — R944 Abnormal results of kidney function studies: Secondary | ICD-10-CM | POA: Diagnosis not present

## 2020-10-20 DIAGNOSIS — Z8249 Family history of ischemic heart disease and other diseases of the circulatory system: Secondary | ICD-10-CM | POA: Diagnosis not present

## 2020-10-20 DIAGNOSIS — E785 Hyperlipidemia, unspecified: Secondary | ICD-10-CM | POA: Diagnosis not present

## 2020-10-20 DIAGNOSIS — I4891 Unspecified atrial fibrillation: Secondary | ICD-10-CM | POA: Diagnosis not present

## 2020-10-20 DIAGNOSIS — I10 Essential (primary) hypertension: Secondary | ICD-10-CM | POA: Diagnosis not present

## 2020-10-20 DIAGNOSIS — I951 Orthostatic hypotension: Secondary | ICD-10-CM | POA: Diagnosis not present

## 2020-10-20 DIAGNOSIS — D6869 Other thrombophilia: Secondary | ICD-10-CM | POA: Diagnosis not present

## 2020-10-20 DIAGNOSIS — Z8673 Personal history of transient ischemic attack (TIA), and cerebral infarction without residual deficits: Secondary | ICD-10-CM | POA: Diagnosis not present

## 2020-10-20 DIAGNOSIS — Z008 Encounter for other general examination: Secondary | ICD-10-CM | POA: Diagnosis not present

## 2020-10-20 DIAGNOSIS — E669 Obesity, unspecified: Secondary | ICD-10-CM | POA: Diagnosis not present

## 2020-10-20 DIAGNOSIS — Z6833 Body mass index (BMI) 33.0-33.9, adult: Secondary | ICD-10-CM | POA: Diagnosis not present

## 2020-11-29 DIAGNOSIS — I10 Essential (primary) hypertension: Secondary | ICD-10-CM | POA: Diagnosis not present

## 2020-11-29 DIAGNOSIS — E0590 Thyrotoxicosis, unspecified without thyrotoxic crisis or storm: Secondary | ICD-10-CM | POA: Diagnosis not present

## 2020-11-29 DIAGNOSIS — Z6832 Body mass index (BMI) 32.0-32.9, adult: Secondary | ICD-10-CM | POA: Diagnosis not present

## 2020-11-29 DIAGNOSIS — E785 Hyperlipidemia, unspecified: Secondary | ICD-10-CM | POA: Diagnosis not present

## 2020-12-11 IMAGING — CR DG CHEST 2V
2 series · 2 of 2 positions shown · non-contrast
Comparison: Two days ago

CLINICAL DATA: Atelectasis

EXAM:
CHEST - 2 VIEW

[chest pa]
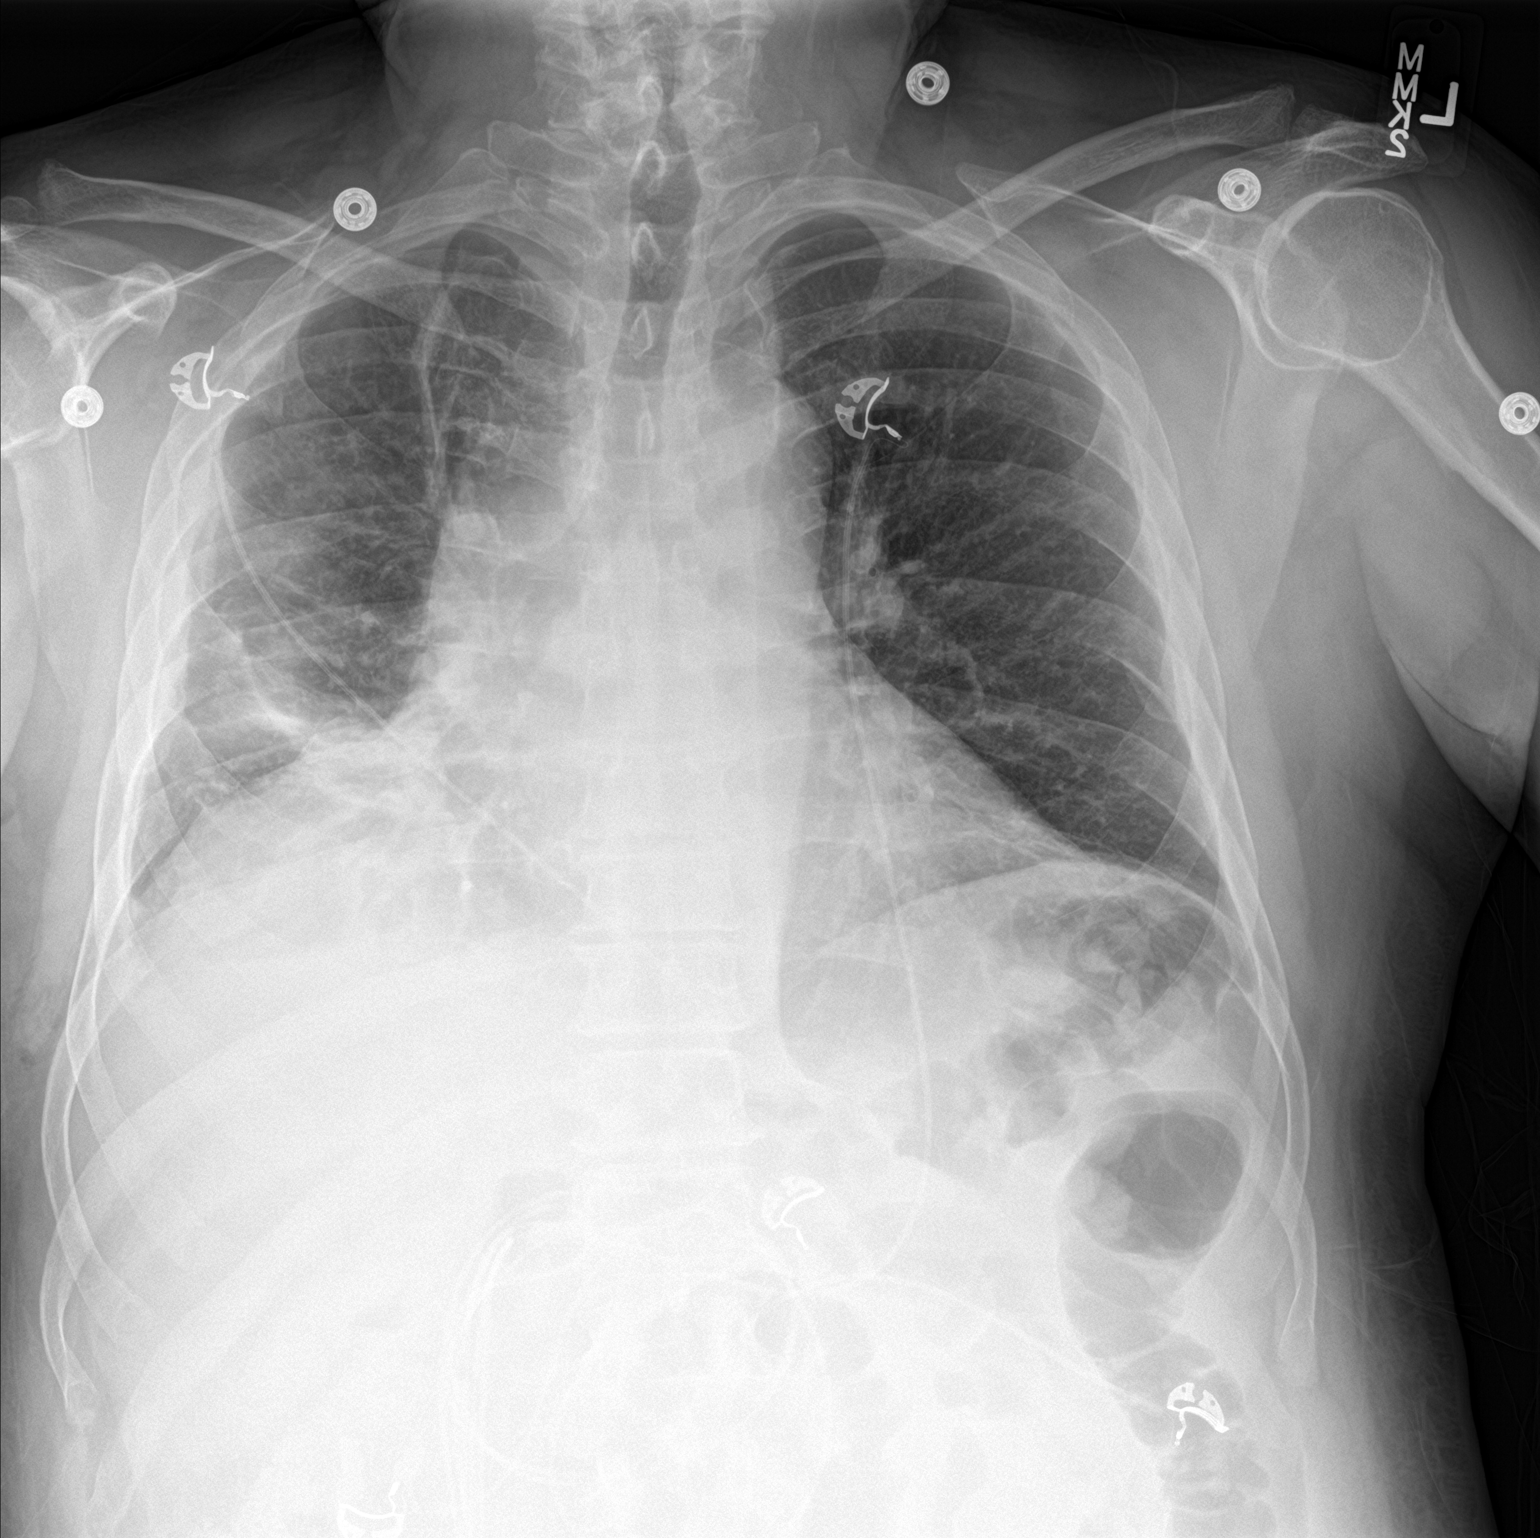

[chest lat]
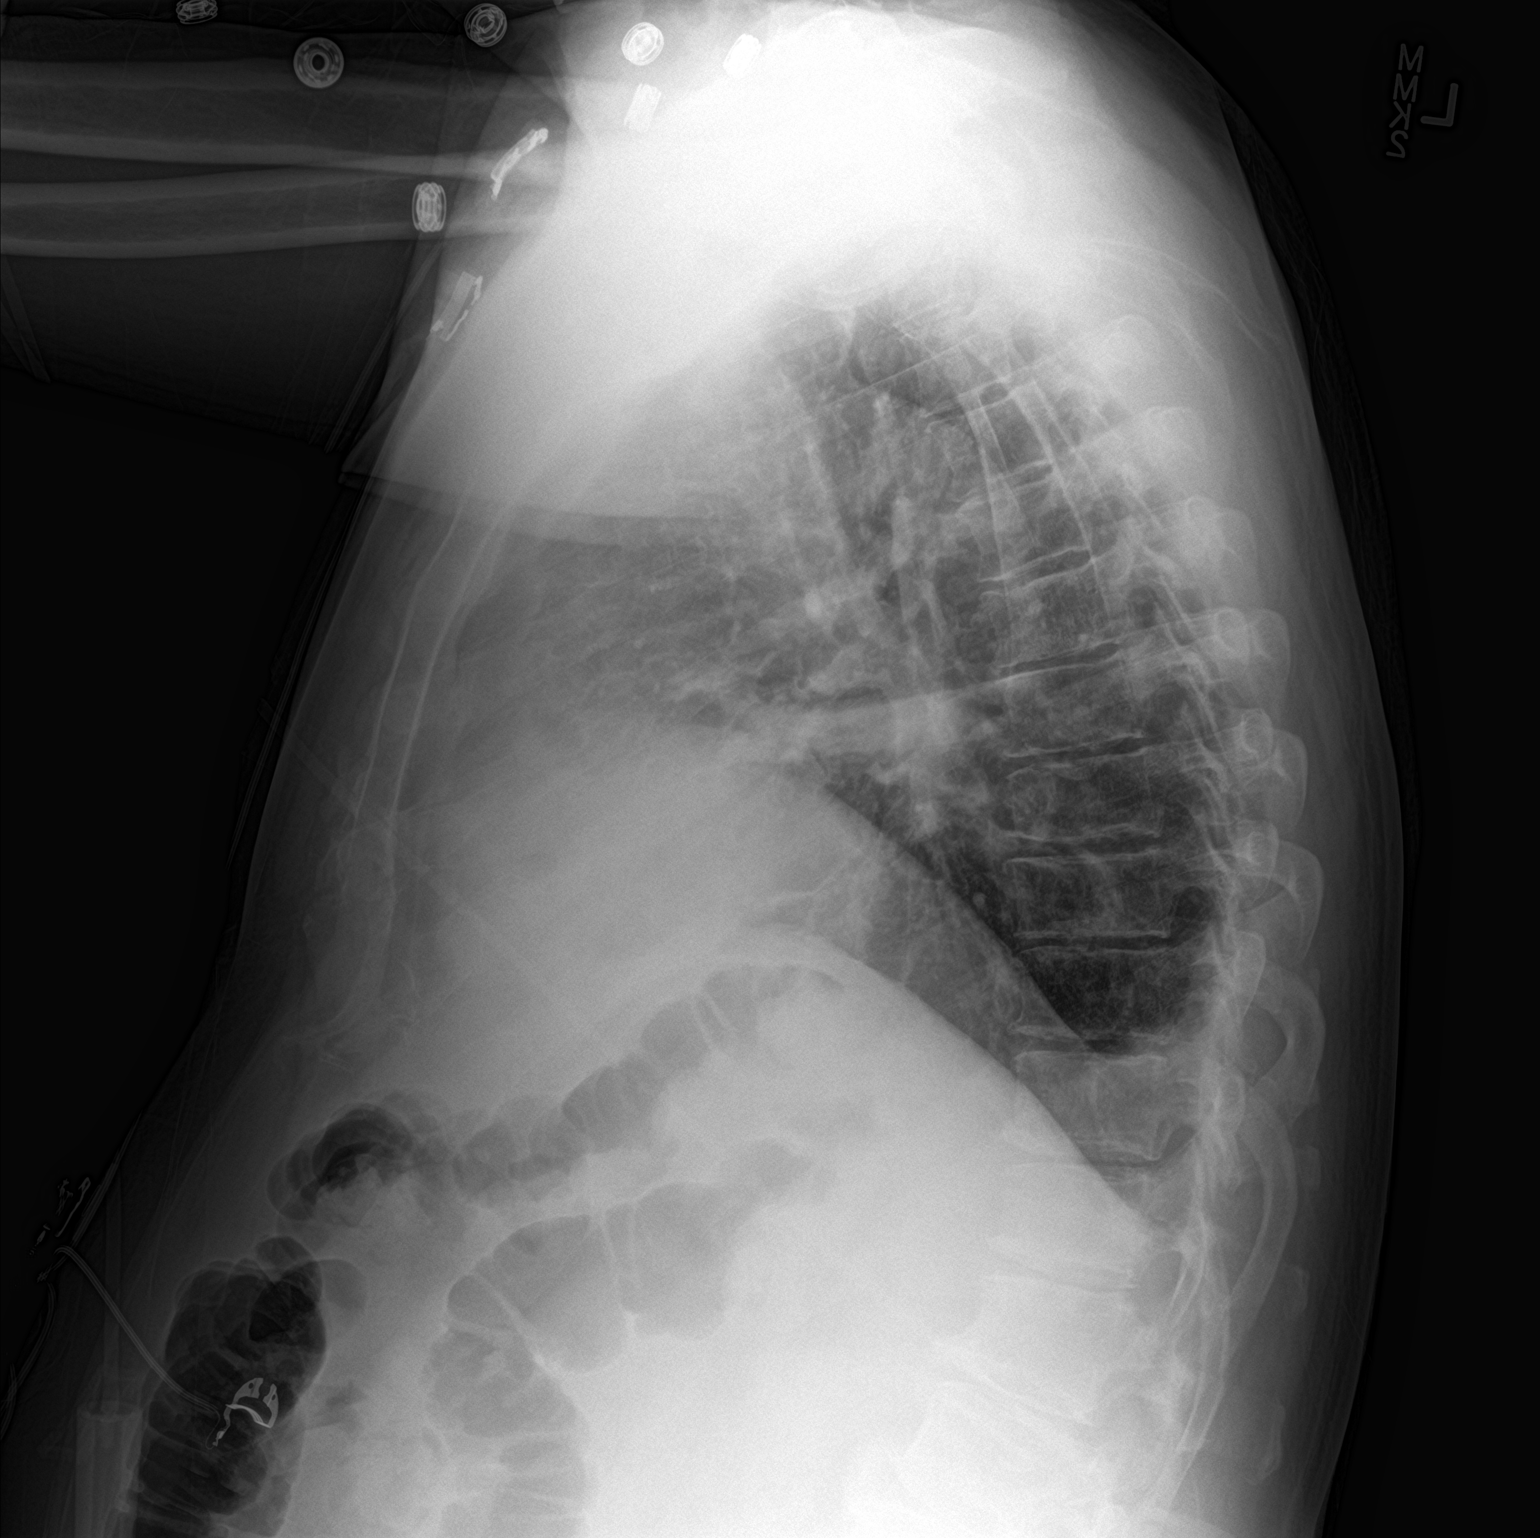

[2 of 2 positions shown; findings below may reference images not displayed]

FINDINGS: Unchanged streaky density in the right chest with volume loss. No
evidence of pneumothorax. No pulmonary edema. Trace right pleural
effusion. Stable postoperative heart size.
IMPRESSION: 1. No pneumothorax after chest tube removal.
2. Asymmetric right-sided atelectasis.

## 2021-03-02 DIAGNOSIS — I10 Essential (primary) hypertension: Secondary | ICD-10-CM | POA: Diagnosis not present

## 2021-03-02 DIAGNOSIS — Z8673 Personal history of transient ischemic attack (TIA), and cerebral infarction without residual deficits: Secondary | ICD-10-CM | POA: Diagnosis not present

## 2021-03-02 DIAGNOSIS — E785 Hyperlipidemia, unspecified: Secondary | ICD-10-CM | POA: Diagnosis not present

## 2021-03-02 DIAGNOSIS — E0590 Thyrotoxicosis, unspecified without thyrotoxic crisis or storm: Secondary | ICD-10-CM | POA: Diagnosis not present

## 2021-03-02 DIAGNOSIS — Z6832 Body mass index (BMI) 32.0-32.9, adult: Secondary | ICD-10-CM | POA: Diagnosis not present

## 2021-05-31 DIAGNOSIS — E559 Vitamin D deficiency, unspecified: Secondary | ICD-10-CM | POA: Diagnosis not present

## 2021-05-31 DIAGNOSIS — E785 Hyperlipidemia, unspecified: Secondary | ICD-10-CM | POA: Diagnosis not present

## 2021-05-31 DIAGNOSIS — R739 Hyperglycemia, unspecified: Secondary | ICD-10-CM | POA: Diagnosis not present

## 2021-05-31 DIAGNOSIS — E0590 Thyrotoxicosis, unspecified without thyrotoxic crisis or storm: Secondary | ICD-10-CM | POA: Diagnosis not present

## 2021-05-31 DIAGNOSIS — Z125 Encounter for screening for malignant neoplasm of prostate: Secondary | ICD-10-CM | POA: Diagnosis not present

## 2021-05-31 DIAGNOSIS — Z6832 Body mass index (BMI) 32.0-32.9, adult: Secondary | ICD-10-CM | POA: Diagnosis not present

## 2021-05-31 DIAGNOSIS — I4891 Unspecified atrial fibrillation: Secondary | ICD-10-CM | POA: Diagnosis not present

## 2021-05-31 DIAGNOSIS — Z8673 Personal history of transient ischemic attack (TIA), and cerebral infarction without residual deficits: Secondary | ICD-10-CM | POA: Diagnosis not present

## 2021-05-31 DIAGNOSIS — I10 Essential (primary) hypertension: Secondary | ICD-10-CM | POA: Diagnosis not present

## 2021-09-05 DIAGNOSIS — E0590 Thyrotoxicosis, unspecified without thyrotoxic crisis or storm: Secondary | ICD-10-CM | POA: Diagnosis not present

## 2021-09-05 DIAGNOSIS — Z1331 Encounter for screening for depression: Secondary | ICD-10-CM | POA: Diagnosis not present

## 2021-09-05 DIAGNOSIS — Z139 Encounter for screening, unspecified: Secondary | ICD-10-CM | POA: Diagnosis not present

## 2021-09-05 DIAGNOSIS — Z6832 Body mass index (BMI) 32.0-32.9, adult: Secondary | ICD-10-CM | POA: Diagnosis not present

## 2021-09-05 DIAGNOSIS — E785 Hyperlipidemia, unspecified: Secondary | ICD-10-CM | POA: Diagnosis not present

## 2021-09-05 DIAGNOSIS — Z9181 History of falling: Secondary | ICD-10-CM | POA: Diagnosis not present

## 2021-09-05 DIAGNOSIS — I10 Essential (primary) hypertension: Secondary | ICD-10-CM | POA: Diagnosis not present

## 2021-09-05 DIAGNOSIS — I4891 Unspecified atrial fibrillation: Secondary | ICD-10-CM | POA: Diagnosis not present

## 2022-01-16 DIAGNOSIS — J1189 Influenza due to unidentified influenza virus with other manifestations: Secondary | ICD-10-CM | POA: Diagnosis not present

## 2022-01-22 DIAGNOSIS — E785 Hyperlipidemia, unspecified: Secondary | ICD-10-CM | POA: Diagnosis not present

## 2022-01-22 DIAGNOSIS — I10 Essential (primary) hypertension: Secondary | ICD-10-CM | POA: Diagnosis not present

## 2022-01-22 DIAGNOSIS — E559 Vitamin D deficiency, unspecified: Secondary | ICD-10-CM | POA: Diagnosis not present

## 2022-01-22 DIAGNOSIS — Z6833 Body mass index (BMI) 33.0-33.9, adult: Secondary | ICD-10-CM | POA: Diagnosis not present

## 2022-01-22 DIAGNOSIS — I4891 Unspecified atrial fibrillation: Secondary | ICD-10-CM | POA: Diagnosis not present

## 2022-01-22 DIAGNOSIS — E0590 Thyrotoxicosis, unspecified without thyrotoxic crisis or storm: Secondary | ICD-10-CM | POA: Diagnosis not present

## 2022-02-05 DIAGNOSIS — N289 Disorder of kidney and ureter, unspecified: Secondary | ICD-10-CM | POA: Diagnosis not present

## 2022-04-25 DIAGNOSIS — E785 Hyperlipidemia, unspecified: Secondary | ICD-10-CM | POA: Diagnosis not present

## 2022-04-25 DIAGNOSIS — E559 Vitamin D deficiency, unspecified: Secondary | ICD-10-CM | POA: Diagnosis not present

## 2022-04-25 DIAGNOSIS — Z125 Encounter for screening for malignant neoplasm of prostate: Secondary | ICD-10-CM | POA: Diagnosis not present

## 2022-04-25 DIAGNOSIS — I4891 Unspecified atrial fibrillation: Secondary | ICD-10-CM | POA: Diagnosis not present

## 2022-04-25 DIAGNOSIS — E0590 Thyrotoxicosis, unspecified without thyrotoxic crisis or storm: Secondary | ICD-10-CM | POA: Diagnosis not present

## 2022-04-25 DIAGNOSIS — I10 Essential (primary) hypertension: Secondary | ICD-10-CM | POA: Diagnosis not present

## 2022-04-25 DIAGNOSIS — Z6832 Body mass index (BMI) 32.0-32.9, adult: Secondary | ICD-10-CM | POA: Diagnosis not present

## 2022-07-26 DIAGNOSIS — Z6832 Body mass index (BMI) 32.0-32.9, adult: Secondary | ICD-10-CM | POA: Diagnosis not present

## 2022-07-26 DIAGNOSIS — I4891 Unspecified atrial fibrillation: Secondary | ICD-10-CM | POA: Diagnosis not present

## 2022-07-26 DIAGNOSIS — E785 Hyperlipidemia, unspecified: Secondary | ICD-10-CM | POA: Diagnosis not present

## 2022-07-26 DIAGNOSIS — I10 Essential (primary) hypertension: Secondary | ICD-10-CM | POA: Diagnosis not present

## 2022-07-26 DIAGNOSIS — E0590 Thyrotoxicosis, unspecified without thyrotoxic crisis or storm: Secondary | ICD-10-CM | POA: Diagnosis not present

## 2022-10-30 DIAGNOSIS — E785 Hyperlipidemia, unspecified: Secondary | ICD-10-CM | POA: Diagnosis not present

## 2022-10-30 DIAGNOSIS — Z1331 Encounter for screening for depression: Secondary | ICD-10-CM | POA: Diagnosis not present

## 2022-10-30 DIAGNOSIS — I10 Essential (primary) hypertension: Secondary | ICD-10-CM | POA: Diagnosis not present

## 2022-10-30 DIAGNOSIS — Z139 Encounter for screening, unspecified: Secondary | ICD-10-CM | POA: Diagnosis not present

## 2022-10-30 DIAGNOSIS — E559 Vitamin D deficiency, unspecified: Secondary | ICD-10-CM | POA: Diagnosis not present

## 2022-10-30 DIAGNOSIS — Z9181 History of falling: Secondary | ICD-10-CM | POA: Diagnosis not present

## 2022-10-30 DIAGNOSIS — E0590 Thyrotoxicosis, unspecified without thyrotoxic crisis or storm: Secondary | ICD-10-CM | POA: Diagnosis not present

## 2022-10-30 DIAGNOSIS — Z8673 Personal history of transient ischemic attack (TIA), and cerebral infarction without residual deficits: Secondary | ICD-10-CM | POA: Diagnosis not present

## 2022-10-30 DIAGNOSIS — Z6832 Body mass index (BMI) 32.0-32.9, adult: Secondary | ICD-10-CM | POA: Diagnosis not present

## 2022-10-30 DIAGNOSIS — I4891 Unspecified atrial fibrillation: Secondary | ICD-10-CM | POA: Diagnosis not present

## 2023-02-06 DIAGNOSIS — E785 Hyperlipidemia, unspecified: Secondary | ICD-10-CM | POA: Diagnosis not present

## 2023-02-06 DIAGNOSIS — Z6832 Body mass index (BMI) 32.0-32.9, adult: Secondary | ICD-10-CM | POA: Diagnosis not present

## 2023-02-06 DIAGNOSIS — Z8673 Personal history of transient ischemic attack (TIA), and cerebral infarction without residual deficits: Secondary | ICD-10-CM | POA: Diagnosis not present

## 2023-02-06 DIAGNOSIS — I4891 Unspecified atrial fibrillation: Secondary | ICD-10-CM | POA: Diagnosis not present

## 2023-02-06 DIAGNOSIS — E0590 Thyrotoxicosis, unspecified without thyrotoxic crisis or storm: Secondary | ICD-10-CM | POA: Diagnosis not present

## 2023-05-15 DIAGNOSIS — E0590 Thyrotoxicosis, unspecified without thyrotoxic crisis or storm: Secondary | ICD-10-CM | POA: Diagnosis not present

## 2023-05-15 DIAGNOSIS — Z125 Encounter for screening for malignant neoplasm of prostate: Secondary | ICD-10-CM | POA: Diagnosis not present

## 2023-05-15 DIAGNOSIS — E785 Hyperlipidemia, unspecified: Secondary | ICD-10-CM | POA: Diagnosis not present

## 2023-05-15 DIAGNOSIS — Z8673 Personal history of transient ischemic attack (TIA), and cerebral infarction without residual deficits: Secondary | ICD-10-CM | POA: Diagnosis not present

## 2023-05-15 DIAGNOSIS — I10 Essential (primary) hypertension: Secondary | ICD-10-CM | POA: Diagnosis not present

## 2023-05-15 DIAGNOSIS — I4891 Unspecified atrial fibrillation: Secondary | ICD-10-CM | POA: Diagnosis not present

## 2023-06-03 DIAGNOSIS — R972 Elevated prostate specific antigen [PSA]: Secondary | ICD-10-CM | POA: Diagnosis not present

## 2023-08-21 DIAGNOSIS — I10 Essential (primary) hypertension: Secondary | ICD-10-CM | POA: Diagnosis not present

## 2023-08-21 DIAGNOSIS — E785 Hyperlipidemia, unspecified: Secondary | ICD-10-CM | POA: Diagnosis not present

## 2023-08-21 DIAGNOSIS — E559 Vitamin D deficiency, unspecified: Secondary | ICD-10-CM | POA: Diagnosis not present

## 2023-08-21 DIAGNOSIS — E0590 Thyrotoxicosis, unspecified without thyrotoxic crisis or storm: Secondary | ICD-10-CM | POA: Diagnosis not present

## 2023-08-21 DIAGNOSIS — R972 Elevated prostate specific antigen [PSA]: Secondary | ICD-10-CM | POA: Diagnosis not present

## 2023-08-21 DIAGNOSIS — I4891 Unspecified atrial fibrillation: Secondary | ICD-10-CM | POA: Diagnosis not present

## 2023-11-26 DIAGNOSIS — I4891 Unspecified atrial fibrillation: Secondary | ICD-10-CM | POA: Diagnosis not present

## 2023-11-26 DIAGNOSIS — E0590 Thyrotoxicosis, unspecified without thyrotoxic crisis or storm: Secondary | ICD-10-CM | POA: Diagnosis not present

## 2023-11-26 DIAGNOSIS — E785 Hyperlipidemia, unspecified: Secondary | ICD-10-CM | POA: Diagnosis not present

## 2023-11-26 DIAGNOSIS — I10 Essential (primary) hypertension: Secondary | ICD-10-CM | POA: Diagnosis not present

## 2023-12-10 DIAGNOSIS — N289 Disorder of kidney and ureter, unspecified: Secondary | ICD-10-CM | POA: Diagnosis not present
